# Patient Record
Sex: Male | Born: 1955
Health system: Southern US, Community
[De-identification: ages and names within clinical notes are randomized; demographics above are authoritative.]

## PROBLEM LIST (undated history)

## (undated) DIAGNOSIS — I10 Essential (primary) hypertension: Secondary | ICD-10-CM

## (undated) DIAGNOSIS — D649 Anemia, unspecified: Secondary | ICD-10-CM

## (undated) DIAGNOSIS — R569 Unspecified convulsions: Secondary | ICD-10-CM

## (undated) DIAGNOSIS — G4730 Sleep apnea, unspecified: Secondary | ICD-10-CM

## (undated) DIAGNOSIS — G629 Polyneuropathy, unspecified: Secondary | ICD-10-CM

## (undated) DIAGNOSIS — F419 Anxiety disorder, unspecified: Secondary | ICD-10-CM

## (undated) DIAGNOSIS — F1010 Alcohol abuse, uncomplicated: Secondary | ICD-10-CM

## (undated) HISTORY — PX: THROAT SURGERY: SHX803

## (undated) HISTORY — DX: Anxiety disorder, unspecified: F41.9

## (undated) HISTORY — DX: Alcohol abuse, uncomplicated: F10.10

---

## 2000-12-13 ENCOUNTER — Emergency Department (HOSPITAL_COMMUNITY): Admission: EM | Admit: 2000-12-13 | Discharge: 2000-12-13 | Payer: Self-pay | Admitting: Emergency Medicine

## 2001-06-07 ENCOUNTER — Emergency Department (HOSPITAL_COMMUNITY): Admission: EM | Admit: 2001-06-07 | Discharge: 2001-06-07 | Payer: Self-pay | Admitting: Emergency Medicine

## 2003-06-28 ENCOUNTER — Ambulatory Visit (HOSPITAL_COMMUNITY): Admission: RE | Admit: 2003-06-28 | Discharge: 2003-06-28 | Payer: Self-pay | Admitting: Internal Medicine

## 2003-09-19 ENCOUNTER — Encounter (HOSPITAL_COMMUNITY): Admission: RE | Admit: 2003-09-19 | Discharge: 2003-09-20 | Payer: Self-pay | Admitting: Internal Medicine

## 2004-10-26 ENCOUNTER — Ambulatory Visit (HOSPITAL_COMMUNITY): Admission: RE | Admit: 2004-10-26 | Discharge: 2004-10-26 | Payer: Self-pay | Admitting: Internal Medicine

## 2005-05-21 ENCOUNTER — Ambulatory Visit (HOSPITAL_COMMUNITY): Admission: RE | Admit: 2005-05-21 | Discharge: 2005-05-21 | Payer: Self-pay | Admitting: Internal Medicine

## 2006-12-26 ENCOUNTER — Ambulatory Visit (HOSPITAL_COMMUNITY): Admission: RE | Admit: 2006-12-26 | Discharge: 2006-12-26 | Payer: Self-pay | Admitting: Internal Medicine

## 2007-05-06 ENCOUNTER — Ambulatory Visit: Payer: Self-pay | Admitting: Internal Medicine

## 2007-05-17 ENCOUNTER — Emergency Department (HOSPITAL_COMMUNITY): Admission: EM | Admit: 2007-05-17 | Discharge: 2007-05-17 | Payer: Self-pay | Admitting: Emergency Medicine

## 2007-05-18 ENCOUNTER — Ambulatory Visit (HOSPITAL_COMMUNITY): Admission: RE | Admit: 2007-05-18 | Discharge: 2007-05-18 | Payer: Self-pay | Admitting: Internal Medicine

## 2007-05-18 ENCOUNTER — Encounter: Payer: Self-pay | Admitting: Internal Medicine

## 2007-05-18 ENCOUNTER — Ambulatory Visit: Payer: Self-pay | Admitting: Internal Medicine

## 2007-05-18 HISTORY — PX: COLONOSCOPY: SHX174

## 2007-08-19 ENCOUNTER — Observation Stay (HOSPITAL_COMMUNITY): Admission: AD | Admit: 2007-08-19 | Discharge: 2007-08-21 | Payer: Self-pay | Admitting: Internal Medicine

## 2007-09-13 ENCOUNTER — Emergency Department (HOSPITAL_COMMUNITY): Admission: EM | Admit: 2007-09-13 | Discharge: 2007-09-13 | Payer: Self-pay | Admitting: Emergency Medicine

## 2007-11-09 ENCOUNTER — Ambulatory Visit (HOSPITAL_COMMUNITY): Admission: RE | Admit: 2007-11-09 | Discharge: 2007-11-09 | Payer: Self-pay | Admitting: Internal Medicine

## 2010-01-08 ENCOUNTER — Observation Stay (HOSPITAL_COMMUNITY): Admission: EM | Admit: 2010-01-08 | Discharge: 2010-01-09 | Payer: Self-pay | Admitting: Emergency Medicine

## 2010-05-29 ENCOUNTER — Encounter (INDEPENDENT_AMBULATORY_CARE_PROVIDER_SITE_OTHER): Payer: Self-pay

## 2010-07-05 NOTE — Letter (Signed)
Summary: Recall, Screening Colonoscopy Only  [facility]  [address]   [place], [place] [address]   Phone: [phone]  Fax: [phone]    May 29, 2010  ALCARIO TINKEY [address] [place], [place]  [address] [birth year]   Dear Mr. Smethers,   Our records indicate it is time to schedule your colonoscopy.    Please call our office at [phone] and ask for the nurse.   Thank you,  Hendricks Limes, LPN Cloria Spring, LPN  [facility] Ph: [phone]   Fax: [phone]

## 2010-08-06 ENCOUNTER — Inpatient Hospital Stay (HOSPITAL_COMMUNITY): Payer: BC Managed Care – PPO

## 2010-08-06 ENCOUNTER — Observation Stay (HOSPITAL_COMMUNITY)
Admission: EM | Admit: 2010-08-06 | Discharge: 2010-08-09 | DRG: 430 | Disposition: A | Discharge status: Other Acute Inpatient Hospital | Payer: BC Managed Care – PPO | Source: Ambulatory Visit | Attending: Internal Medicine | Admitting: Internal Medicine

## 2010-08-06 DIAGNOSIS — K219 Gastro-esophageal reflux disease without esophagitis: Secondary | ICD-10-CM | POA: Diagnosis present

## 2010-08-06 DIAGNOSIS — F29 Unspecified psychosis not due to a substance or known physiological condition: Principal | ICD-10-CM | POA: Diagnosis present

## 2010-08-06 DIAGNOSIS — N189 Chronic kidney disease, unspecified: Secondary | ICD-10-CM | POA: Diagnosis present

## 2010-08-06 DIAGNOSIS — N400 Enlarged prostate without lower urinary tract symptoms: Secondary | ICD-10-CM | POA: Diagnosis present

## 2010-08-06 DIAGNOSIS — F1010 Alcohol abuse, uncomplicated: Secondary | ICD-10-CM | POA: Diagnosis present

## 2010-08-06 DIAGNOSIS — I129 Hypertensive chronic kidney disease with stage 1 through stage 4 chronic kidney disease, or unspecified chronic kidney disease: Secondary | ICD-10-CM | POA: Diagnosis present

## 2010-08-06 DIAGNOSIS — F3289 Other specified depressive episodes: Secondary | ICD-10-CM | POA: Diagnosis present

## 2010-08-06 DIAGNOSIS — F329 Major depressive disorder, single episode, unspecified: Secondary | ICD-10-CM | POA: Diagnosis present

## 2010-08-06 LAB — CBC
HCT: 40.8 % (ref 39.0–52.0)
Hemoglobin: 13.4 g/dL (ref 13.0–17.0)
MCH: 30.8 pg (ref 26.0–34.0)
MCV: 93.8 fL (ref 78.0–100.0)
Platelets: 229 10*3/uL (ref 150–400)
RBC: 4.35 MIL/uL (ref 4.22–5.81)
WBC: 8.3 10*3/uL (ref 4.0–10.5)

## 2010-08-06 LAB — URINALYSIS, ROUTINE W REFLEX MICROSCOPIC
Glucose, UA: NEGATIVE mg/dL
Hgb urine dipstick: NEGATIVE
Nitrite: NEGATIVE
Protein, ur: NEGATIVE mg/dL
Specific Gravity, Urine: 1.02 (ref 1.005–1.030)
Urobilinogen, UA: 1 mg/dL (ref 0.0–1.0)
pH: 5.5 (ref 5.0–8.0)

## 2010-08-06 LAB — DIFFERENTIAL
Basophils Absolute: 0 10*3/uL (ref 0.0–0.1)
Eosinophils Absolute: 0.1 10*3/uL (ref 0.0–0.7)
Eosinophils Relative: 1 % (ref 0–5)
Lymphocytes Relative: 15 % (ref 12–46)
Lymphs Abs: 1.3 10*3/uL (ref 0.7–4.0)
Monocytes Absolute: 0.7 10*3/uL (ref 0.1–1.0)
Monocytes Relative: 8 % (ref 3–12)
Neutrophils Relative %: 75 % (ref 43–77)

## 2010-08-06 LAB — COMPREHENSIVE METABOLIC PANEL
AST: 15 U/L (ref 0–37)
Albumin: 3.6 g/dL (ref 3.5–5.2)
BUN: 20 mg/dL (ref 6–23)
CO2: 27 mEq/L (ref 19–32)
Chloride: 101 mEq/L (ref 96–112)
Creatinine, Ser: 1.76 mg/dL — ABNORMAL HIGH (ref 0.4–1.5)
GFR calc non Af Amer: 41 mL/min — ABNORMAL LOW (ref >60)
Glucose, Bld: 125 mg/dL — ABNORMAL HIGH (ref 70–99)
Potassium: 4.9 mEq/L (ref 3.5–5.1)
Total Bilirubin: 0.9 mg/dL (ref 0.3–1.2)
Total Protein: 7 g/dL (ref 6.0–8.3)

## 2010-08-06 LAB — RAPID URINE DRUG SCREEN, HOSP PERFORMED
Amphetamines: NOT DETECTED
Barbiturates: NOT DETECTED
Benzodiazepines: NOT DETECTED
Cocaine: NOT DETECTED
Opiates: NOT DETECTED

## 2010-08-06 LAB — MAGNESIUM: Magnesium: 1.2 mg/dL — ABNORMAL LOW (ref 1.5–2.5)

## 2010-08-07 DIAGNOSIS — F29 Unspecified psychosis not due to a substance or known physiological condition: Secondary | ICD-10-CM

## 2010-08-08 LAB — BASIC METABOLIC PANEL
BUN: 9 mg/dL (ref 6–23)
CO2: 27 mEq/L (ref 19–32)
Chloride: 106 mEq/L (ref 96–112)
GFR calc Af Amer: >60 mL/min (ref >60)
GFR calc non Af Amer: 59 mL/min — ABNORMAL LOW (ref >60)
Glucose, Bld: 121 mg/dL — ABNORMAL HIGH (ref 70–99)
Potassium: 4.1 mEq/L (ref 3.5–5.1)
Sodium: 142 mEq/L (ref 135–145)

## 2010-08-09 ENCOUNTER — Inpatient Hospital Stay (HOSPITAL_COMMUNITY)
Admission: AD | Admit: 2010-08-09 | Discharge: 2010-08-11 | DRG: 430 | Disposition: A | Discharge status: Home / Self Care | Payer: BC Managed Care – PPO | Source: Ambulatory Visit | Attending: Psychiatry | Admitting: Psychiatry

## 2010-08-09 DIAGNOSIS — F329 Major depressive disorder, single episode, unspecified: Secondary | ICD-10-CM

## 2010-08-09 DIAGNOSIS — E7800 Pure hypercholesterolemia, unspecified: Secondary | ICD-10-CM

## 2010-08-09 DIAGNOSIS — F1010 Alcohol abuse, uncomplicated: Secondary | ICD-10-CM

## 2010-08-09 DIAGNOSIS — K219 Gastro-esophageal reflux disease without esophagitis: Secondary | ICD-10-CM

## 2010-08-09 DIAGNOSIS — N400 Enlarged prostate without lower urinary tract symptoms: Secondary | ICD-10-CM

## 2010-08-09 DIAGNOSIS — Z880 Allergy status to penicillin: Secondary | ICD-10-CM

## 2010-08-09 DIAGNOSIS — F29 Unspecified psychosis not due to a substance or known physiological condition: Principal | ICD-10-CM

## 2010-08-09 DIAGNOSIS — I10 Essential (primary) hypertension: Secondary | ICD-10-CM

## 2010-08-09 NOTE — Consult Note (Signed)
NAME:  Tony Patterson, Tony Patterson                ACCOUNT NO.:  [id number]  MEDICAL RECORD NO.:  [id number]           PATIENT TYPE:  I  LOCATION:  A317                          FACILITY:  [facility]  PHYSICIAN:  Eulogio Ditch, MD DATE OF BIRTH:  [birth year]  DATE OF CONSULTATION:  08/07/2010 DATE OF DISCHARGE:                                CONSULTATION   REASON FOR CONSULT:  Acute psychoses.  HISTORY OF PRESENT ILLNESS:  A 55 year old white male, who reported seeing ghost in the house for the last 2 days and also hearing voices coming from the house.  The patient has no such kind of experience in the past.  His CT and MRI are negative.  Chest x-ray is negative.  UDS is negative.  Rest of the labs are within normal limits except for creatinine 1.7 and glucose 125.  The patient has no past psych hospitalization or history of suicide attempt.  Recently 30 years ago, the patient told me he tried to hang himself.  Currently, he denies any suicidal ideations.  The patient is logical and goal directed during the interview, who is not delusional.  The patient is not followed by psychiatrist in the outpatient setting or getting any counseling.  The patient was on Celexa in the past, but he is not on this medication. The patient has a history of alcohol abuse, but he reported that he is sober now.  The patient denies abuse of any drugs.  The patient lives alone, is divorced, works two part-time jobs, lives in [place].  He denied any recent trauma.  He denied any recent change in the medications.  On speaking with the RN of the patient at [facility], Misty Stanley, she told me at night the patient wake up and told them that there is a [employer] man looking for him and also that he has a roommate, but there was no roommate.  PAST MEDICAL HISTORY:  History of hypertension, hyperlipidemia, gastroesophageal reflux disease.  MENTAL STATUS EXAM:  The patient is calm, cooperative during the interview.  Fair eye contact.   No psychomotor agitation retardation noted during the interview.  No abnormal movements noticed.  Speech normal in rate, rhythm, and volume.  Thought process logical and goal directed.  Mood anxious.  Affect mood congruent.  Thought content, not suicidal or homicidal, not delusional.  Thought perception audiovisual hallucinations present.  Does not seem to be internally preoccupied. Cognition alert, awake, oriented x3.  Memory immediate, recent remote fair.  Attention and concentration fair.  Abstraction ability fair. Insight and judgment fair.  DIAGNOSES:  Axis I:  Acute psychotic disorder, NOS. Axis II:  Deferred. Axis III:  See medical notes. Axis IV:  Unspecified. Axis V:  40.  RECOMMENDATIONS: 1. I increased the patient's Risperdal from 0.25-0.5 at bedtime. 2. The patient is on Wellbutrin 300 mg that can be continued. 3. The patient can be transferred to behavioral health once medically     stable. 4. The patient agrees for transfer.     Eulogio Ditch, MD     SA/MEDQ  D:  08/07/2010  T:  08/07/2010  Job:  [id number]  Electronically Signed by Eulogio Ditch  on 08/09/2010 06:23:19 AM

## 2010-08-10 DIAGNOSIS — F23 Brief psychotic disorder: Secondary | ICD-10-CM

## 2010-08-10 NOTE — Discharge Summary (Signed)
  NAME:  [name], [name]                ACCOUNT NO.:  [id number]  MEDICAL RECORD NO.:  [id number]           PATIENT TYPE:  LOCATION:                                 FACILITY:  PHYSICIAN:  Kingsley Callander. [name], MD       DATE OF BIRTH:  March 18, 1956  DATE OF ADMISSION: DATE OF DISCHARGE:  LH                              DISCHARGE SUMMARY   ADDENDUM  No beds were available at the [facility] yesterday, and [name] spent another night at [facility].  He has been stable.  He has no hallucinations whatsoever now.  His dose of risperidone was increased from 0.25 mg to 0.5 mg last night.  His magnesium level has now normalized to 1.5.  His serum creatinine has dropped to 1.2.  He has an impaired fasting glucose of 121 which has been followed as an outpatient.  His condition is now significantly improved.  He is stable for discharge to the [facility] for further evaluation and treatment.     Kingsley Callander. [name], MDROF/MEDQ  D:  08/08/2010  T:  08/08/2010  Job:  [id number]  Electronically Signed by Carylon Perches MD on 08/10/2010 07:49:18 AM

## 2010-08-10 NOTE — H&P (Signed)
NAME:  Tony Patterson, Tony Patterson                ACCOUNT NO.:  [id number]  MEDICAL RECORD NO.:  [id number]           PATIENT TYPE:  INP  LOCATION:  A317                          FACILITY:  [facility]  PHYSICIAN:  Kingsley Callander. [name], MD       DATE OF BIRTH:  1955/10/25  DATE OF ADMISSION:  08/06/2010 DATE OF DISCHARGE:  LH                             HISTORY & PHYSICAL   CHIEF COMPLAINT:  Confusion.  HISTORY OF PRESENT ILLNESS:  This patient is a 55 year old white male who presented to the office with his brother after he had been witnessed having altered mental status at home.  The patient complained of seeing ghosts.  He complained of seeing people behind his television.  He had described a group of people coming to sleep at his house the night before after attending a local meeting.  The patient has a history of alcohol abuse, but has not consumed alcohol in approximately 3 weeks. There is no known history of recreational substance abuse.  The patient has no prior history of psychotic behavior with hallucinations or delusional thinking.  He describes having heard voices.  He had been treated for depression with Wellbutrin XL.  The patient denied any recent head trauma.  He has not experienced fever or any symptoms of infection.  PAST MEDICAL HISTORY: 1. Alcohol abuse. 2. Hypertension. 3. Hypertriglyceridemia. 4. Proteinuria. 5. Tubulovillous adenoma. 6. Chronic kidney disease. 7. BPH. 8. GERD.  MEDICATIONS: 1. Metoprolol 50 mg b.i.d. 2. Aspirin 81 mg daily. 3. Prilosec 20 mg daily. 4. Flomax 0.4 mg daily. 5. Wellbutrin XL 300 mg daily.  ALLERGIES:  PENICILLIN.  SOCIAL HISTORY:  He does not smoke cigarettes.  He has not used recreational substances.  He has not used alcohol for 3 weeks.  He has previously been to rehab at [facility].  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.  FAMILY HISTORY:  His father had coronary heart disease and congestive heart  failure.  His mother has had temporal arteritis and osteoporosis.  REVIEW OF SYSTEMS:  No fever, chills, headache, cough, chest pain, abdominal pain, difficulty voiding, diarrhea, vomiting, or loss of consciousness.  PHYSICAL EXAMINATION:  VITAL SIGNS:  Weight 193 which is unchanged from 1 month ago, blood pressure in the office 86/70, pulse 72, respirations 16, afebrile. GENERAL:  Alert and in no distress. HEENT:  Eyes, nose, pharynx, and TMs appear normal. NECK:  Supple with no JVD, thyromegaly, or carotid bruits. LUNGS: Clear. HEART:  Regular with no murmurs. ABDOMEN:  Soft and nontender with no hepatosplenomegaly. EXTREMITIES:  No cyanosis, clubbing, or edema. NEUROLOGIC:  He does not appear to be in withdrawal.  He is not tremulous.  He describes recent delusions and hallucinations.  He has no focal weakness. LYMPH NODES:  Revealed no enlargement. SKIN:  Warm and dry.  LABORATORY DATA:  Reveals a normal CBC.  His metabolic profile is normal except for a creatinine of 1.76.  His magnesium level was low at 1.2. His urine drug screen is negative.  Chest x-ray reveals old rib fractures, but no acute infiltrate.  IMPRESSION/PLAN:  1. Altered mental status.  He is now revealing signs of psychosis.  He     has a history of alcohol abuse and alcohol withdrawal.  He will be     treated empirically with thiamine, multivitamins, magnesium, and     p.r.n. Ativan.  He will be started on low-dose Risperdal at night.     The MRI of his brain is pending.  A consultation will be obtained     with Psychiatry and with the ACT team. 2. Chronic kidney disease, stable. 3. History of hypertension.  His initial blood pressure was low.     Metoprolol will be held for now. 4. Hypomagnesemia.  We will replace intravenously. 5. History of depression. 6. Gastroesophageal reflux disease, continue Prilosec. 7. History of benign prostatic hypertrophy, continue Flomax.     Kingsley Callander. [name],  MD     ROF/MEDQ  D:  08/07/2010  T:  08/07/2010  Job:  [id number]  Electronically Signed by Carylon Perches MD on 08/10/2010 07:49:16 AM

## 2010-08-12 NOTE — Discharge Summary (Signed)
  NAME:  Tony Patterson, Tony Patterson NO.:  [id number]  MEDICAL RECORD NO.:  [id number]          PATIENT TYPE:  LOCATION:                                 FACILITY:  PHYSICIAN:  Kingsley Callander. [name], MD            DATE OF BIRTH:  DATE OF ADMISSION: DATE OF DISCHARGE:  LH                              DISCHARGE SUMMARY   ADDENDUM  [name] required another night stay at [facility] after a bed could not be arranged at the [facility].  He has had no visual or auditory hallucinations overnight.  He feels well this morning and has no complaints other than some residual cough from a recent respiratory infection.  He has brought to light the question as to whether his recent confusional state has been related to overuse of cold and cough medications.  He had evidently been using these medications to excess by his report now.  He has received Risperdal now for 3 nights. His symptoms have resolved.  He will be further assessed by Psychiatry today.     Kingsley Callander. [name], MD     ROF/MEDQ  D:  08/09/2010  T:  08/09/2010  Job:  [id number]  Electronically Signed by Carylon Perches MD on 08/12/2010 09:32:04 AM

## 2010-08-17 LAB — RAPID URINE DRUG SCREEN, HOSP PERFORMED
Amphetamines: NOT DETECTED
Barbiturates: NOT DETECTED
Benzodiazepines: NOT DETECTED
Cocaine: NOT DETECTED
Opiates: NOT DETECTED
Tetrahydrocannabinol: NOT DETECTED

## 2010-08-17 LAB — ETHANOL: Alcohol, Ethyl (B): 297 mg/dL — ABNORMAL HIGH (ref 0–10)

## 2010-08-17 LAB — CBC
HCT: 40 % (ref 39.0–52.0)
Hemoglobin: 13.7 g/dL (ref 13.0–17.0)
MCH: 31.8 pg (ref 26.0–34.0)
MCHC: 34.2 g/dL (ref 30.0–36.0)
MCV: 93.1 fL (ref 78.0–100.0)
Platelets: 241 10*3/uL (ref 150–400)
RBC: 4.3 MIL/uL (ref 4.22–5.81)
RDW: 14.5 % (ref 11.5–15.5)
WBC: 8.6 10*3/uL (ref 4.0–10.5)

## 2010-08-17 LAB — COMPREHENSIVE METABOLIC PANEL
AST: 35 U/L (ref 0–37)
BUN: 21 mg/dL (ref 6–23)
CO2: 22 mEq/L (ref 19–32)
Calcium: 8.2 mg/dL — ABNORMAL LOW (ref 8.4–10.5)
Chloride: 102 mEq/L (ref 96–112)
Creatinine, Ser: 1.77 mg/dL — ABNORMAL HIGH (ref 0.4–1.5)
GFR calc non Af Amer: 40 mL/min — ABNORMAL LOW (ref >60)
Glucose, Bld: 198 mg/dL — ABNORMAL HIGH (ref 70–99)
Total Bilirubin: 0.4 mg/dL (ref 0.3–1.2)

## 2010-08-17 LAB — DIFFERENTIAL
Basophils Absolute: 0 10*3/uL (ref 0.0–0.1)
Eosinophils Relative: 0 % (ref 0–5)
Lymphocytes Relative: 23 % (ref 12–46)
Lymphs Abs: 2 10*3/uL (ref 0.7–4.0)
Neutrophils Relative %: 72 % (ref 43–77)

## 2010-09-02 ENCOUNTER — Emergency Department (HOSPITAL_COMMUNITY)
Admission: EM | Admit: 2010-09-02 | Discharge: 2010-09-02 | Disposition: A | Discharge status: Home / Self Care | Payer: BC Managed Care – PPO | Attending: Emergency Medicine | Admitting: Emergency Medicine

## 2010-09-02 DIAGNOSIS — N400 Enlarged prostate without lower urinary tract symptoms: Secondary | ICD-10-CM | POA: Insufficient documentation

## 2010-09-02 DIAGNOSIS — E7800 Pure hypercholesterolemia, unspecified: Secondary | ICD-10-CM | POA: Insufficient documentation

## 2010-09-02 DIAGNOSIS — K219 Gastro-esophageal reflux disease without esophagitis: Secondary | ICD-10-CM | POA: Insufficient documentation

## 2010-09-02 DIAGNOSIS — I10 Essential (primary) hypertension: Secondary | ICD-10-CM | POA: Insufficient documentation

## 2010-09-02 DIAGNOSIS — F1010 Alcohol abuse, uncomplicated: Secondary | ICD-10-CM | POA: Insufficient documentation

## 2010-09-02 LAB — BASIC METABOLIC PANEL
Chloride: 99 mEq/L (ref 96–112)
GFR calc non Af Amer: 51 mL/min — ABNORMAL LOW (ref >60)
Potassium: 4.7 mEq/L (ref 3.5–5.1)
Sodium: 143 mEq/L (ref 135–145)

## 2010-09-02 LAB — RAPID URINE DRUG SCREEN, HOSP PERFORMED
Amphetamines: NOT DETECTED
Barbiturates: NOT DETECTED
Benzodiazepines: NOT DETECTED
Cocaine: NOT DETECTED

## 2010-09-02 LAB — DIFFERENTIAL
Basophils Relative: 1 % (ref 0–1)
Eosinophils Absolute: 0.1 10*3/uL (ref 0.0–0.7)
Lymphs Abs: 2.1 10*3/uL (ref 0.7–4.0)
Monocytes Relative: 5 % (ref 3–12)
Neutro Abs: 3.4 10*3/uL (ref 1.7–7.7)
Neutrophils Relative %: 58 % (ref 43–77)

## 2010-09-02 LAB — CBC
Hemoglobin: 13.7 g/dL (ref 13.0–17.0)
MCV: 93 fL (ref 78.0–100.0)
Platelets: 209 10*3/uL (ref 150–400)
RBC: 4.44 MIL/uL (ref 4.22–5.81)
WBC: 5.9 10*3/uL (ref 4.0–10.5)

## 2010-09-02 LAB — ETHANOL: Alcohol, Ethyl (B): 342 mg/dL — ABNORMAL HIGH (ref 0–10)

## 2010-09-04 NOTE — Discharge Summary (Addendum)
NAME:  Tony Patterson, Tony Patterson                ACCOUNT NO.:  [id number]  MEDICAL RECORD NO.:  [id number]           PATIENT TYPE:  I  LOCATION:                                FACILITY:  [facility]  PHYSICIAN:  Eulogio Ditch, MD DATE OF BIRTH:  [birth year]  DATE OF ADMISSION:  08/08/2010 DATE OF DISCHARGE:  08/11/2010                              DISCHARGE SUMMARY   IDENTIFYING INFORMATION:  A 55 year old Caucasian male.  This is a voluntary admission.  HISTORY OF PRESENT ILLNESS:  [facility] for Previn who presented on transfer from [facility] where he had been admitted on March 5 for altered mental status.  He was seeing people talking behind the television and seeing ghostly type figures.  He reported that 1 month previously he had had a brief relapse on alcohol. He had previously been on our unit in August 2011 also for our alcohol abuse and had a period of sobriety until his recent relapse.  At the time he presented on our unit, he had no further hallucinations for the previous 48 hours.  MEDICAL EVALUATION AND DIAGNOSTIC STUDIES:  Braxley is followed by Dr. Carylon Perches, his primary care physician, who treated him at [facility]. He does have a history of chronic kidney disease and alcoholic hepatitis and GERD.  He was being observed for a period of delirium and is in full contact at the time of transfer.  COURSE OF HOSPITALIZATION:  He was admitted to our acute stabilization unit.  He initially presented in full contact with reality, fully oriented.  No response latency, affect appropriate.  No evidence of delirium, confusion or psychosis.  We elected to start him back on his Wellbutrin XR 300 mg which he had previously taken, and Risperdal 0.5 mg p.o. q.h.s. which had been started on the medical unit.  We also resumed his other routine medications and gave him thiamine 100 mg daily, aspirin 81 mg daily.  He had talked with Dr. [name] and revealed that he had also been  taking some Benadryl prior to admission and we were operating under the possibility that his altered mental status could have been anticholinergic induced combined with some alcohol abuse.  By March 10, he continued to be in full contact with reality, interacting appropriately with peers and staff, attentive in group and had no subjective complaints.  No dangerous ideas, thinking goal directed and logical.  Our counselor had made contact with his brother Annette Stable who continued to express his support.  DISCHARGE DIAGNOSIS:  AXIS I:  Altered mental status secondary to medication-induced side effects.  Alcohol abuse. AXIS II:  No diagnosis. AXIS III:  History of chronic kidney disease. AXIS IV:  No diagnosis. AXIS V: Current 62, past year 66 estimated.  DISCHARGE MEDICATIONS: 1. Risperdal 0.5 mg p.o. q.h.s. and review with Dr. [name] at next     appointment. 2. Aspirin 81 mg daily. 3. Dextromethorphan 1-2 teaspoons q.6 h as needed for cough. 4. Flomax 0.4 mg daily. 5. Metoprolol 50 mg b.i.d. 6. Prilosec 1 tablet daily 20 mg. 7. Wellbutrin XL 300 mg daily.  Margaret A. Lorin Picket, N.P.   ______________________________ Eulogio Ditch, MD    MAS/MEDQ  D:  08/29/2010  T:  08/29/2010  Job:  [id number]  Electronically Signed by Kari Baars N.P. on 09/04/2010 05:10:24 PM Electronically Signed by Eulogio Ditch  on 09/04/2010 07:08:33 PM

## 2010-09-12 ENCOUNTER — Emergency Department (HOSPITAL_COMMUNITY)
Admission: EM | Admit: 2010-09-12 | Discharge: 2010-09-13 | Disposition: A | Discharge status: Home / Self Care | Payer: BC Managed Care – PPO | Attending: Emergency Medicine | Admitting: Emergency Medicine

## 2010-09-12 DIAGNOSIS — F329 Major depressive disorder, single episode, unspecified: Secondary | ICD-10-CM | POA: Insufficient documentation

## 2010-09-12 DIAGNOSIS — F1010 Alcohol abuse, uncomplicated: Secondary | ICD-10-CM | POA: Insufficient documentation

## 2010-09-12 DIAGNOSIS — F3289 Other specified depressive episodes: Secondary | ICD-10-CM | POA: Insufficient documentation

## 2010-09-12 DIAGNOSIS — I10 Essential (primary) hypertension: Secondary | ICD-10-CM | POA: Insufficient documentation

## 2010-09-12 LAB — CBC
HCT: 41.6 % (ref 39.0–52.0)
Hemoglobin: 14 g/dL (ref 13.0–17.0)
RBC: 4.52 MIL/uL (ref 4.22–5.81)
RDW: 14.5 % (ref 11.5–15.5)
WBC: 6.4 10*3/uL (ref 4.0–10.5)

## 2010-09-12 LAB — DIFFERENTIAL
Basophils Absolute: 0.1 10*3/uL (ref 0.0–0.1)
Lymphocytes Relative: 43 % (ref 12–46)
Neutro Abs: 3 10*3/uL (ref 1.7–7.7)
Neutrophils Relative %: 47 % (ref 43–77)

## 2010-09-12 LAB — COMPREHENSIVE METABOLIC PANEL
ALT: 24 U/L (ref 0–53)
AST: 26 U/L (ref 0–37)
CO2: 28 mEq/L (ref 19–32)
Calcium: 8.7 mg/dL (ref 8.4–10.5)
GFR calc Af Amer: 48 mL/min — ABNORMAL LOW (ref >60)
GFR calc non Af Amer: 39 mL/min — ABNORMAL LOW (ref >60)
Potassium: 3.8 mEq/L (ref 3.5–5.1)
Sodium: 140 mEq/L (ref 135–145)

## 2010-09-12 LAB — RAPID URINE DRUG SCREEN, HOSP PERFORMED
Amphetamines: NOT DETECTED
Tetrahydrocannabinol: NOT DETECTED

## 2010-10-16 NOTE — Discharge Summary (Signed)
NAME:  Tony Patterson, Tony Patterson                ACCOUNT NO.:  [id number]   MEDICAL RECORD NO.:  [id number]          PATIENT TYPE:  OBV   LOCATION:  A336                          FACILITY:  [facility]   PHYSICIAN:  Kingsley Callander. [name], MD       DATE OF BIRTH:  10-Mar-1956   DATE OF ADMISSION:  08/19/2007  DATE OF DISCHARGE:  03/20/2009LH                               DISCHARGE SUMMARY   DISCHARGE DIAGNOSES:  1. Hypotension.  2. Alcohol withdrawal  3. Alcoholic hepatitis.  4. Hypomagnesemia.  5. Hypertension.  6. Benign prostatic hypertrophy.  7. Gastroesophageal reflux disease.  8. Hyperlipidemia.  9. Chronic kidney disease.  10.Impaired fasting glucose.  11.History of colon polyps.   HOSPITAL COURSE:  This patient is a 55 year old male who presented with  tremulousness, weakness, and hypotension.  His blood pressure was in the  mid 80s systolic.  He had recently been drinking alcohol heavily.  He  still an alcohol level of 220.  Liver enzymes were elevated.  His SGOT  was 143 with an SGPT of 90.  His albumin was mildly low at 3.4.  Hemoglobin was 12.5.  He was hospitalized and started on IV normal  saline.  Micardis which she had taken for hypertension was held.  His  blood pressure normalized.  He was treated with an alcohol detox regimen  of thiamine, multivitamins, magnesium, folic acid, and Ativan.  He was  hypomagnesemic at 1.2.   He underwent ultrasound of the liver which was negative.  A repeat liver  profile revealed slight reductions in his transaminases with a drop in  his SGOT to 131 and SGPT of 83.  His albumin dropped to 3.1.  He was  able to eat well.   Lipitor, aspirin, and Micardis were held   He was seen by the ACT team.  Arrangements have been made for treatment  at [facility], but unfortunately a bed is  not available until next  week.   He remained alert and oriented.  He had  tremulousness which  responded  to lorazepam.  He has been counseled to avoid all alcohol.   DISCHARGE MEDICATIONS:  1. Lorazepam 1 mg q.i.d.  2. Thiamine 100 mg daily.  3. Magnesium oxide 400 mg b.i.d.  4. Multivitamin daily.  5. Lexapro 10 mg daily.  6. Prilosec 20 mg daily.  7. Flomax 0.4 mg daily.   FOLLOW UP:  The patient will be seen in our office in one week.  He will  be treated at [facility] when a bed becomes available next week.      Kingsley Callander. [name], MD  Electronically Signed     ROF/MEDQ  D:  08/21/2007  T:  08/21/2007  Job:  [id number]

## 2010-10-16 NOTE — H&P (Signed)
NAME:  Tony Patterson, Tony Patterson                ACCOUNT NO.:  [id number]   MEDICAL RECORD NO.:  [id number]          PATIENT TYPE:  OBV   LOCATION:  A336                          FACILITY:  [facility]   PHYSICIAN:  Kingsley Callander. [name], MD       DATE OF BIRTH:  Oct 02, 1955   DATE OF ADMISSION:  08/19/2007  DATE OF DISCHARGE:  LH                              HISTORY & PHYSICAL   HISTORY OF PRESENT ILLNESS:  The patient is a 55 year old white male who  presented with weakness and tremor.  He had been consuming alcohol  heavily.  He has had several falls.  He has been eating poorly.  He has  felt shaky and has medicated the shakes with alcohol.  His brother is  with him.  He has been convinced to seek proper treatment for this now.  He has previously been advised on multiple occasions to discontinue  alcohol use.  He has had liver enzyme abnormalities previously.   PAST MEDICAL HISTORY:  1. Hypertension.  2. Chronic kidney disease.  3. Benign prostatic hypertrophy.  4. Hyperlipidemia.  5. Impaired fasting glucose.  6. Colon polyps.   MEDICATIONS:  1. Micardis 40 mg daily.  2. Flomax 0.4 mg daily.  3. Lipitor 10 mg daily.  4. Lexapro 10 mg daily.  5. Prilosec 20 mg daily   ALLERGIES:  PENICILLIN.   SOCIAL HISTORY:  He does not use recreational drugs.  He does not smoke.  He is retired from [employer].  He lives alone.   FAMILY HISTORY:  His father had coronary artery disease.  His mother has  had temporal arteritis and osteoporosis and hypertension.   REVIEW OF SYSTEMS:  He denied syncope.  He has felt nauseated, but has  not vomited.  His oral intake has been sporadic and has recently lost  about 10 pounds.   PHYSICAL EXAM:  VITAL SIGNS:  Initial blood pressure 84/68, pulse 100,  respirations 16.  GENERAL:  Mildly tremulous, but alert and oriented.  HEENT:  He has abrasion on his forehead.  No scleral icterus.  Pharynx  is moist.  NECK:  Supple without JVD, thyromegaly or lymphadenopathy.  LUNGS:  Clear.  HEART:  Regular with no murmurs.  ABDOMEN:  Nontender.  No hepatosplenomegaly.  EXTREMITIES:  No cyanosis, clubbing or edema.  He has a bruise on his  right hip.  NEUROLOGIC:  Pupils equal, round and reactive to light.  Extraocular  movements intact.  Gait slightly unsteady.  Strength intact.  LYMPH NODES:  No enlargement   LABORATORY DATA:  White count 4000, hemoglobin 12.6 with an MCV of  95,000, platelets 162,000.  Sodium 133, potassium 4.8, chloride 21,  glucose 77, BUN 14, creatinine 1.53, bilirubin 1, SGOT 143, SGPT 90,  total protein 6.3, albumin 3.4, calcium 8.9.  Alcohol level 220. INR  0.9.  Ammonia level is normal.   EKG reveals normal sinus rhythm with no ischemic changes.   IMPRESSION:  1. Alcohol withdrawal.  He still has a significant alcohol level and      states he last had a couple  of glasses of wine the night before,      but had consumed bourbon prior to that yesterday.  He will be      hospitalized and treated with a detoxification regimen of thiamine,      multivitamins, folic acid and Ativan.  2. Alcoholic hepatitis.  We will obtain an ultrasound of the liver.      Lipitor will be stopped.  3. Hypotension.  We will treat with intravenous fluids and hold      Micardis.  4. Benign prostatic hypertrophy.  Continue Flomax.  5. History of gastroesophageal reflux disease.  Continue Prilosec.  6. Depression.  Continue Lexapro.  We will obtain an ACT team consult.      A transfer to [facility] for a 28-day rehabilitation program      would certainly be beneficial.      Kingsley Callander. [name], MD  Electronically Signed     ROF/MEDQ  D:  08/20/2007  T:  08/20/2007  Job:  [id number]

## 2010-10-16 NOTE — Op Note (Signed)
NAME:  Tony Patterson, Tony Patterson                ACCOUNT NO.:  [id number]   MEDICAL RECORD NO.:  [id number]          PATIENT TYPE:  AMB   LOCATION:  DAY                           FACILITY:  [facility]   PHYSICIAN:  R. Roetta Sessions, M.D. DATE OF BIRTH:  07/07/1955   DATE OF PROCEDURE:  05/18/2007  DATE OF DISCHARGE:                               OPERATIVE REPORT   PROCEDURE:  Colonoscopy, polypectomy.   ENDOSCOPIST:  Dr. Jonathon Bellows.   INDICATIONS FOR PROCEDURE:  A 55 year old gentleman followed primarily  by Dr. Kingsley Callander. [name], noted a couple of episodes of low volume painless  hematochezia one month ago.  He has one bowel movement daily.  Otherwise  he denies constipation and diarrhea.  No family history of colorectal  neoplasias .  Has never had his lower GI tract evaluated.  A colonoscopy  is now being done.  This approach was discussed with the patient at  length.  The potential risks, benefits and alternatives have been  discussed and questions answered.  Will place the document in the  patient's medical record.   DESCRIPTION OF PROCEDURE:  Oxygen saturation, blood pressure, pulse and  respirations were monitored throughout the entire procedure.  Conscious  sedation with Versed 5 mg IV and Demerol 125 mg IV in divided doses.  The instrument is the Pentax video chip system.  A digital rectal  examination revealed no abnormalities.  The prep was adequate.   COLON:  The colonic mucosa was observed from the rectosigmoid junction  to the left, transverse and right colon,  to the area of the appendiceal  orifice, the ileocecal valve and the cecum.  These structures were well  seen and photographed for the record.  There was an 8 mm sessile polyp  at the base of the cecum, which was treated with one pass of the hot  snare cautery and recovered.  From this level, the colonoscope was  slowly withdrawn.  All previously-mentioned mucosal surfaces were again  seen.  The colonic mucosa otherwise  appeared normal.  The scope was  pulled down in the rectum.  A thorough examination of the rectal mucosa,  including the retroflexed view of the anal verge demonstrated a single  anal papilla hemorrhoid only.   The patient tolerated the procedure well and was reactive in endoscopy.   IMPRESSION:  1. Internal hemorrhoids, a single anal papilla, otherwise normal.  2. Single polyp, as described above, removed as described above.      Otherwise normal colon.   RECOMMENDATIONS:  1. Hemorrhoid literature provided to [name].  2. A 10-day course of Anusol AC suppositories, one per rectum at      bedtime.  3. Follow up on pathology.  4. Further recommendations to follow.      Jonathon Bellows, M.D.  Electronically Signed     RMR/MEDQ  D:  05/18/2007  T:  05/18/2007  Job:  [id number]

## 2010-10-16 NOTE — H&P (Signed)
NAME:  Tony Patterson, Tony Patterson                ACCOUNT NO.:  [id number]   MEDICAL RECORD NO.:  [id number]          PATIENT TYPE:  AMB   LOCATION:  DAY                           FACILITY:  [facility]   PHYSICIAN:  R. Roetta Sessions, M.D. DATE OF BIRTH:  [birth year]   DATE OF ADMISSION:  05/06/2007  DATE OF DISCHARGE:  LH                              HISTORY & PHYSICAL   CHIEF COMPLAINT:  Hematochezia.   HISTORY OF PRESENT ILLNESS:  Mr. Tony Darling. Patterson is a pleasant 55-year-  old Caucasian male followed primarily by Dr. Carylon Patterson, who came to see  me on his own to further evaluate a couple of episodes of hematochezia  he had 1 month ago.  He states in the setting of a normal bowel movement  without straining, constipation or diarrhea, he passed some blood per  rectum which he saw on stool and toilet paper when he wiped.  He has not  had any associated abdominal pain.  There has been no melena.  He has  not seen any rectal bleeding prior to this episode or since that time.  He has never had his lower GI tract evaluated.  His only GI problems in  the past are that of occasional gastroesophageal reflux disease symptoms  for which he takes Prilosec OTC with good control of his symptoms.  There is no family history of inflammatory bowel disease of colorectal  neoplasia.  Again, he has never had his lower GI tract evaluated.   Tony Patterson denies any odynophagia, dysphagia, early satiety, nausea or  vomiting.  He does site a 50-pound weight loss over the past several  months in the wake of separation and impending divorce.  He tells me he  cooks for himself and does not like what he prepares.  His oral intake  has diminished significantly since his separation.   PAST MEDICAL HISTORY:  1. Significant for hypercholesterolemia.  2. Hypertension.  3. History of panic attacks.   PAST SURGERIES:  1. Some type of throat surgery 20 years ago.  2. He had surgery of his left foot secondary to fracture in the past.   CURRENT MEDICATIONS:  1. Cozaar 100 mg daily.  2. Prilosec OTC daily.  3. Lipitor 10 mg daily.  4. ASA 81 mg daily.  5. Possibly benzodiazepine for panic attacks.   ALLERGIES:  PENICILLIN.   FAMILY HISTORY:  Mother is alive at age 3 and in fairly good health.  Father died at age 38 with heart failure.  No history of chronic GI or  liver illness.   SOCIAL HISTORY:  Patient is separated.  No children.  He works part-time  at [employer].  He does not use tobacco.  He drinks approximately  4 ounces of vodka 3-4 times weekly.  Occasionally he has a beer.  No  illicit drugs.   REVIEW OF SYSTEMS:  No chest pain, dyspnea on exertion, fever, chills.  Weight loss as outlined above.  Otherwise as in history of present  illness.   PHYSICAL EXAMINATION:  GENERAL:  A 55 year old gentleman resting  comfortably.  VITAL SIGNS:  Weight 191, height 6 feet 4 inches, temperature 98.2, BP  112/78, pulse 68.  SKIN:  Warm and dry.  There is no jaundice or any stigmata of chronic  liver disease.  HEENT:  There is no scleral icterus.  CHEST:  Lungs are clear to auscultation.  HEART:  Regular rate and rhythm without murmur, gallop or rub.  ABDOMEN:  Flat, positive bowel sounds, soft, nontender without  appreciable mass or organomegaly.  EXTREMITIES:  No edema.  RECTAL:  Exam deferred for colonoscopy.   IMPRESSION:  Tony Patterson is a very pleasant 55 year old  gentleman with a recent episode of hematochezia.  He is devoid of any  lower GI tract symptoms currently.  I told Tony Patterson to just go ahead  and have his lower GI tract evaluated as he is at the threshold age for 55  screening colonoscopy anyway.   He has sustained a significant weight loss recently which seemed to be  well explained by his domestic stressors and more change in eating  habits.   RECOMMENDATIONS:  Diagnostic colonoscopy in the very near future.  Risks, benefits and alternatives have been reviewed.  Tony Patterson   questions were answered and he is agreeable.  We will proceed in the  very near future.  Further recommendations to follow.      Tony Patterson, M.D.  Electronically Signed     RMR/MEDQ  D:  05/06/2007  T:  05/06/2007  Job:  [id number]

## 2010-10-19 NOTE — Group Therapy Note (Signed)
NAME:  Tony Patterson, Tony Patterson                          ACCOUNT NO.:  [id number]   MEDICAL RECORD NO.:  [id number]                  PATIENT TYPE:  PREC   LOCATION:                                       FACILITY:   PHYSICIAN:  Kingsley Callander. [name], M.D.                  DATE OF BIRTH:   DATE OF PROCEDURE:  DATE OF DISCHARGE:                                    STRESS TEST   FINDINGS:  1. The patient exercised 9 minutes (completing stage 3 of the Bruce     protocol), obtaining a maximal heart rate of 174 (100% of the age-     predicted maximal heart rate), at a work load of 10.1 mets.  Discontinued     exercise after surpassing his target heart rate.  2. He developed mild substernal chest pressure at peak exercise.  There were     frequent PVC's in early recovery which resolved spontaneously.  There     were no other arrhythmias.  3. There was a hypertensive blood pressure response to exercise, reaching a     blood pressure of 208/92.  4.  There were no ST segment changes     diagnostic of ischemia.   IMPRESSION:  1. No evidence of exercise-induced ischemia.  2. Cardiolite images are pending.      ___________________________________________                                            Kingsley Callander. [name], M.D.   ROF/MEDQ  D:  09/19/2003  T:  09/19/2003  Job:  [id number]

## 2011-02-25 LAB — COMPREHENSIVE METABOLIC PANEL
BUN: 14
Calcium: 8.9
Glucose, Bld: 77
Sodium: 133 — ABNORMAL LOW
Total Protein: 6.3

## 2011-02-25 LAB — DIFFERENTIAL
Lymphocytes Relative: 22
Lymphs Abs: 0.9
Monocytes Relative: 8
Neutro Abs: 2.8
Neutrophils Relative %: 69

## 2011-02-25 LAB — MAGNESIUM: Magnesium: 1.2 — ABNORMAL LOW

## 2011-02-25 LAB — CBC
HCT: 35.9 — ABNORMAL LOW
Hemoglobin: 12.6 — ABNORMAL LOW
MCHC: 35.1
RDW: 14.6

## 2011-02-25 LAB — APTT: aPTT: 26

## 2011-02-25 LAB — HEPATIC FUNCTION PANEL
Indirect Bilirubin: 0.7
Total Protein: 6.3

## 2011-02-25 LAB — PROTIME-INR: INR: 0.9

## 2011-02-26 LAB — COMPREHENSIVE METABOLIC PANEL
ALT: 114 — ABNORMAL HIGH
AST: 155 — ABNORMAL HIGH
Albumin: 3.1 — ABNORMAL LOW
Alkaline Phosphatase: 99
BUN: 9
CO2: 26
Calcium: 9.3
Chloride: 109
Creatinine, Ser: 1.5
GFR calc Af Amer: 60 — ABNORMAL LOW
GFR calc non Af Amer: 50 — ABNORMAL LOW
Glucose, Bld: 119 — ABNORMAL HIGH
Potassium: 4.5
Sodium: 141
Total Bilirubin: 0.8
Total Protein: 6.2

## 2011-02-26 LAB — OCCULT BLOOD X 1 CARD TO LAB, STOOL: Fecal Occult Bld: NEGATIVE

## 2011-02-26 LAB — DIFFERENTIAL
Basophils Absolute: 0
Basophils Relative: 0
Eosinophils Absolute: 0.2
Eosinophils Relative: 4
Lymphocytes Relative: 18
Lymphs Abs: 1
Monocytes Absolute: 0.5
Monocytes Relative: 9
Neutro Abs: 3.7
Neutrophils Relative %: 69

## 2011-02-26 LAB — CBC
HCT: 35.5 — ABNORMAL LOW
Hemoglobin: 12.2 — ABNORMAL LOW
MCHC: 34.4
MCV: 96.3
Platelets: 181
RBC: 3.68 — ABNORMAL LOW
RDW: 15.3
WBC: 5.3

## 2011-10-08 ENCOUNTER — Observation Stay (HOSPITAL_COMMUNITY)
Admission: EM | Admit: 2011-10-08 | Discharge: 2011-10-09 | Disposition: A | Discharge status: Home / Self Care | Payer: BC Managed Care – PPO | Attending: Internal Medicine | Admitting: Internal Medicine

## 2011-10-08 ENCOUNTER — Emergency Department (HOSPITAL_COMMUNITY): Payer: BC Managed Care – PPO

## 2011-10-08 ENCOUNTER — Encounter (HOSPITAL_COMMUNITY): Payer: Self-pay

## 2011-10-08 DIAGNOSIS — E860 Dehydration: Secondary | ICD-10-CM | POA: Insufficient documentation

## 2011-10-08 DIAGNOSIS — N400 Enlarged prostate without lower urinary tract symptoms: Secondary | ICD-10-CM | POA: Insufficient documentation

## 2011-10-08 DIAGNOSIS — F1010 Alcohol abuse, uncomplicated: Secondary | ICD-10-CM | POA: Insufficient documentation

## 2011-10-08 DIAGNOSIS — N183 Chronic kidney disease, stage 3 unspecified: Secondary | ICD-10-CM | POA: Insufficient documentation

## 2011-10-08 DIAGNOSIS — E876 Hypokalemia: Secondary | ICD-10-CM | POA: Insufficient documentation

## 2011-10-08 DIAGNOSIS — R569 Unspecified convulsions: Principal | ICD-10-CM | POA: Insufficient documentation

## 2011-10-08 DIAGNOSIS — I10 Essential (primary) hypertension: Secondary | ICD-10-CM | POA: Insufficient documentation

## 2011-10-08 HISTORY — DX: Unspecified convulsions: R56.9

## 2011-10-08 HISTORY — DX: Essential (primary) hypertension: I10

## 2011-10-08 LAB — DIFFERENTIAL
Basophils Absolute: 0 10*3/uL (ref 0.0–0.1)
Eosinophils Relative: 2 % (ref 0–5)
Lymphocytes Relative: 24 % (ref 12–46)
Monocytes Absolute: 0.3 10*3/uL (ref 0.1–1.0)
Monocytes Relative: 7 % (ref 3–12)

## 2011-10-08 LAB — BASIC METABOLIC PANEL
BUN: 13 mg/dL (ref 6–23)
CO2: 26 mEq/L (ref 19–32)
Calcium: 8.9 mg/dL (ref 8.4–10.5)
Creatinine, Ser: 1.38 mg/dL — ABNORMAL HIGH (ref 0.50–1.35)

## 2011-10-08 LAB — CBC
MCH: 29.8 pg (ref 26.0–34.0)
MCHC: 32.6 g/dL (ref 30.0–36.0)
RBC: 4.06 MIL/uL — ABNORMAL LOW (ref 4.22–5.81)
RDW: 16.4 % — ABNORMAL HIGH (ref 11.5–15.5)

## 2011-10-08 LAB — GLUCOSE, CAPILLARY: Glucose-Capillary: 131 mg/dL — ABNORMAL HIGH (ref 70–99)

## 2011-10-08 MED ORDER — FOLIC ACID 1 MG PO TABS
1.0000 mg | ORAL_TABLET | Freq: Every day | ORAL | Status: DC
  Administered 2011-10-08 – 2011-10-09 (×2): 1 mg via ORAL
  Filled 2011-10-08 (×2): qty 1

## 2011-10-08 MED ORDER — LORAZEPAM 2 MG/ML IJ SOLN
1.0000 mg | INTRAMUSCULAR | Status: DC | PRN
  Administered 2011-10-08: 1 mg via INTRAVENOUS
  Filled 2011-10-08: qty 1

## 2011-10-08 MED ORDER — SODIUM CHLORIDE 0.9 % IJ SOLN
3.0000 mL | Freq: Two times a day (BID) | INTRAMUSCULAR | Status: DC
  Administered 2011-10-09: 3 mL via INTRAVENOUS
  Filled 2011-10-08: qty 3

## 2011-10-08 MED ORDER — VITAMIN B-1 100 MG PO TABS
100.0000 mg | ORAL_TABLET | Freq: Every day | ORAL | Status: DC
  Administered 2011-10-08 – 2011-10-09 (×2): 100 mg via ORAL
  Filled 2011-10-08 (×2): qty 1

## 2011-10-08 MED ORDER — ONDANSETRON HCL 4 MG/2ML IJ SOLN: 4.0000 mg | Freq: Four times a day (QID) | INTRAMUSCULAR | Status: DC | PRN

## 2011-10-08 MED ORDER — ALUM & MAG HYDROXIDE-SIMETH 200-200-20 MG/5ML PO SUSP: 30.0000 mL | Freq: Four times a day (QID) | ORAL | Status: DC | PRN

## 2011-10-08 MED ORDER — ACETAMINOPHEN 650 MG RE SUPP: 650.0000 mg | Freq: Four times a day (QID) | RECTAL | Status: DC | PRN

## 2011-10-08 MED ORDER — TAMSULOSIN HCL 0.4 MG PO CAPS
ORAL_CAPSULE | ORAL | Status: AC
  Filled 2011-10-08: qty 1

## 2011-10-08 MED ORDER — ADULT MULTIVITAMIN W/MINERALS CH
1.0000 | ORAL_TABLET | Freq: Every day | ORAL | Status: DC
  Administered 2011-10-08 – 2011-10-09 (×2): 1 via ORAL
  Filled 2011-10-08 (×2): qty 1

## 2011-10-08 MED ORDER — LORAZEPAM BOLUS VIA INFUSION: 1.0000 mg | INTRAVENOUS | Status: DC | PRN

## 2011-10-08 MED ORDER — MAGNESIUM SULFATE 40 MG/ML IJ SOLN
2.0000 g | Freq: Four times a day (QID) | INTRAMUSCULAR | Status: AC
  Administered 2011-10-08 (×2): 2 g via INTRAVENOUS
  Filled 2011-10-08 (×2): qty 50

## 2011-10-08 MED ORDER — PANTOPRAZOLE SODIUM 40 MG PO TBEC
40.0000 mg | DELAYED_RELEASE_TABLET | Freq: Every day | ORAL | Status: DC
  Administered 2011-10-08 – 2011-10-09 (×2): 40 mg via ORAL
  Filled 2011-10-08 (×2): qty 1

## 2011-10-08 MED ORDER — SODIUM CHLORIDE 0.9 % IJ SOLN: 3.0000 mL | INTRAMUSCULAR | Status: DC | PRN

## 2011-10-08 MED ORDER — ONDANSETRON HCL 4 MG PO TABS: 4.0000 mg | ORAL_TABLET | Freq: Four times a day (QID) | ORAL | Status: DC | PRN

## 2011-10-08 MED ORDER — POTASSIUM CHLORIDE CRYS ER 20 MEQ PO TBCR
40.0000 meq | EXTENDED_RELEASE_TABLET | Freq: Three times a day (TID) | ORAL | Status: DC
  Administered 2011-10-08 – 2011-10-09 (×4): 40 meq via ORAL
  Filled 2011-10-08 (×4): qty 2

## 2011-10-08 MED ORDER — LORAZEPAM 2 MG/ML IJ SOLN
1.0000 mg | Freq: Once | INTRAMUSCULAR | Status: AC
  Administered 2011-10-08: 1 mg via INTRAVENOUS
  Filled 2011-10-08: qty 1

## 2011-10-08 MED ORDER — SODIUM CHLORIDE 0.9 % IV SOLN: 250.0000 mL | INTRAVENOUS | Status: DC | PRN

## 2011-10-08 MED ORDER — ACETAMINOPHEN 325 MG PO TABS: 650.0000 mg | ORAL_TABLET | Freq: Four times a day (QID) | ORAL | Status: DC | PRN

## 2011-10-08 MED ORDER — ENOXAPARIN SODIUM 30 MG/0.3ML ~~LOC~~ SOLN
30.0000 mg | SUBCUTANEOUS | Status: DC
  Administered 2011-10-08: 30 mg via SUBCUTANEOUS
  Filled 2011-10-08: qty 0.3

## 2011-10-08 MED ORDER — TAMSULOSIN HCL 0.4 MG PO CAPS
0.4000 mg | ORAL_CAPSULE | Freq: Every day | ORAL | Status: DC
  Administered 2011-10-08 – 2011-10-09 (×2): 0.4 mg via ORAL
  Filled 2011-10-08 (×5): qty 1

## 2011-10-08 NOTE — ED Notes (Signed)
Pt is eating his meal tray.

## 2011-10-08 NOTE — ED Notes (Signed)
Pt was eating lunch and started having seizure.  EMS arrived to find pt on the floor postdictal, confused.  EMS says took approx 5-67min to "come to."  Denies any pain.  Pt alert and oriented at this time.

## 2011-10-08 NOTE — ED Provider Notes (Signed)
History   This chart was scribed for Tony Gaskins, MD by Clarita Crane. The patient was seen in room APA04/APA04. Patient's care was started at 1425.    CSN: [id number]  Arrival date & time 10/08/11  1425   First MD Initiated Contact with Patient 10/08/11 1426      Chief Complaint  Patient presents with  . Seizures     HPI CHRISTEN BEDOYA is a 56 y.o. male who presents to the Emergency Department BIB EMS for evaluation following seizure which occurred just prior to arrival while eating lunch but has resolved at this time. Patient currently states he is at this baseline. Per EMS, patient was found post ictal and confused and reportedly took between 5 and 7 minutes to return to baseline after arriving.  Denies HA, visual changes, chest pain, SOB, abdominal pain, tongue biting, head injury. Patient denies h/o seizures but notes h/o HTN.  He is feeling improved.  Nothing worsens his symptoms He reports h/o ETOH abuse has not had ETOH in 6 months He has no other complaints  Past Medical History  Diagnosis Date  . Seizures   . Hypertension     Past Surgical History  Procedure Date  . Throat surgery     polyps removed    No family history on file.  History  Substance Use Topics  . Smoking status: Former Games developer  . Smokeless tobacco: Not on file  . Alcohol Use: No      Review of Systems A complete 10 system review of systems was obtained and all systems are negative except as noted in the HPI and PMH.   Allergies  Penicillins  Home Medications  No current outpatient prescriptions on file.  BP 120/74  Pulse 117  Temp(Src) 98.2 F (36.8 C) (Oral)  Resp 18  Ht 6\' 3"  (1.905 m)  Wt 197 lb (89.359 kg)  BMI 24.62 kg/m2  SpO2 96%  Physical Exam CONSTITUTIONAL: Well developed/well nourished HEAD AND FACE: Normocephalic/atraumatic EYES: EOMI/PERRL ENMT: Mucous membranes moist, no tongue lacerations NECK: supple no meningeal signs SPINE:entire spine nontender CV:  S1/S2 noted, no murmurs/rubs/gallops noted LUNGS: Lungs are clear to auscultation bilaterally, no apparent distress ABDOMEN: soft, nontender, no rebound or guarding NEURO: Pt is awake/alert, moves all extremitiesx4, no focal neuro deficits, nontoxic in appearance EXTREMITIES: pulses normal, full ROM SKIN: warm, color normal PSYCH: no abnormalities of mood noted  ED Course  Procedures   DIAGNOSTIC STUDIES: Oxygen Saturation is 96% on room air, adequate by my interpretation.    COORDINATION OF CARE: 2:40PM- Patient informed of current plan for treatment and evaluation and agrees with plan at this time.  3:30 PM I spoke to his PCP dr fagan He reports pt has not been sober for months and he knows that he has been drinking which means this could be ETOH withdrawal seizure D/w patient and he is aware of need for admission D/w dr Ignacia Palma who will call for admission to dr fagan once CT head is back       MDM  Nursing notes reviewed and considered in documentation All labs/vitals reviewed and considered Previous records reviewed and considered     Date: 10/08/2011  Rate: 112  Rhythm: sinus tachycardia  QRS Axis: normal  Intervals: normal  ST/T Wave abnormalities: nonspecific ST changes  Conduction Disutrbances:none  Narrative Interpretation:   Old EKG Reviewed: unchanged     I personally performed the services described in this documentation, which was scribed in my presence. The  recorded information has been reviewed and considered.      Tony Gaskins, MD 10/08/11 [phone]

## 2011-10-08 NOTE — ED Notes (Signed)
CRITICAL VALUE ALERT  Critical value received:  Mg-0.5  Date of notification: 10/08/11  Time of notification:  1831  Critical value read back:yes  Nurse who received alert:  t abbott rn  MD notified (1st page):  [name]  Time of first page:  1831  MD notified (2nd page):  Time of second page:  Responding MD:  [name]  Time MD responded:  [phone]

## 2011-10-08 NOTE — Progress Notes (Signed)
5:10 PM CT of head was negative.  Dr. [name] informed of this result.  He will be in to see pt.

## 2011-10-09 ENCOUNTER — Encounter: Payer: Self-pay | Admitting: Cardiology

## 2011-10-09 ENCOUNTER — Observation Stay (HOSPITAL_COMMUNITY)
Admit: 2011-10-09 | Discharge: 2011-10-09 | Disposition: A | Discharge status: Home / Self Care | Payer: BC Managed Care – PPO | Attending: Internal Medicine | Admitting: Internal Medicine

## 2011-10-09 LAB — HEPATIC FUNCTION PANEL
ALT: 31 U/L (ref 0–53)
Alkaline Phosphatase: 70 U/L (ref 39–117)
Bilirubin, Direct: <0.1 mg/dL (ref 0.0–0.3)
Total Bilirubin: 0.3 mg/dL (ref 0.3–1.2)
Total Protein: 6.2 g/dL (ref 6.0–8.3)

## 2011-10-09 LAB — BASIC METABOLIC PANEL
CO2: 26 mEq/L (ref 19–32)
Calcium: 8.7 mg/dL (ref 8.4–10.5)
Creatinine, Ser: 1.21 mg/dL (ref 0.50–1.35)
GFR calc non Af Amer: 66 mL/min — ABNORMAL LOW (ref >90)
Glucose, Bld: 109 mg/dL — ABNORMAL HIGH (ref 70–99)
Sodium: 140 mEq/L (ref 135–145)

## 2011-10-09 MED ORDER — ENOXAPARIN SODIUM 40 MG/0.4ML ~~LOC~~ SOLN
40.0000 mg | SUBCUTANEOUS | Status: DC
  Administered 2011-10-09: 40 mg via SUBCUTANEOUS
  Filled 2011-10-09: qty 0.4

## 2011-10-09 MED ORDER — METOPROLOL TARTRATE 25 MG PO TABS
25.0000 mg | ORAL_TABLET | Freq: Two times a day (BID) | ORAL | Status: DC
  Administered 2011-10-09: 25 mg via ORAL
  Filled 2011-10-09: qty 1

## 2011-10-09 MED ORDER — METOPROLOL TARTRATE 50 MG PO TABS: 25.0000 mg | ORAL_TABLET | Freq: Two times a day (BID) | ORAL | Status: DC

## 2011-10-09 MED ORDER — TAMSULOSIN HCL 0.4 MG PO CAPS: 0.4000 mg | ORAL_CAPSULE | Freq: Every day | ORAL | Status: DC

## 2011-10-09 NOTE — Care Management Note (Unsigned)
    Page 1 of 1   10/09/2011     1:14:59 PM   CARE MANAGEMENT NOTE 10/09/2011  Patient:  Tony Patterson, Tony Patterson   Account Number:  [id number]  Date Initiated:  10/09/2011  Documentation initiated by:  Sharrie Rothman  Subjective/Objective Assessment:   Pt admitted from home with new onset seizures. Pt lives alone and is employeed. Pt states that he does drink occasionally but does not need any help with etoh abuse.     Action/Plan:   No CM needs noted at this time. Pt refused CSW referral at this time.   Anticipated DC Date:  10/15/2011   Anticipated DC Plan:  HOME/SELF CARE      DC Planning Services  CM consult      Choice offered to / List presented to:             Status of service:  In process, will continue to follow Medicare Important Message given?   (If response is "NO", the following Medicare IM given date fields will be blank) Date Medicare IM given:   Date Additional Medicare IM given:    Discharge Disposition:  HOME/SELF CARE  Per UR Regulation:    If discussed at Long Length of Stay Meetings, dates discussed:    Comments:  10/09/11 1310 Arlyss Queen, RN BSN CM

## 2011-10-09 NOTE — Discharge Summary (Signed)
Tony Patterson, Tony Patterson                ACCOUNT NO.:  [id number]  MEDICAL RECORD NO.:  [id number]  LOCATION:  A301                          FACILITY:  [facility]  PHYSICIAN:  Kingsley Callander. [name], MD       DATE OF BIRTH:  10-May-1956  DATE OF ADMISSION:  10/08/2011 DATE OF DISCHARGE:  05/08/2013LH                              DISCHARGE SUMMARY   DISCHARGE DIAGNOSES: 1. Possible seizure. 2. Hypertension. 3. Hypokalemia. 4. History of alcohol abuse. 5. Chronic kidney disease, stage III. 6. Gastroesophageal reflux disease. 7. Benign prostatic hypertrophy.  HOSPITAL COURSE:  This patient is a 56 year old white male with a history of intermittent alcohol abuse who presented to the emergency room after suffering a possible seizure at a local [employer]. Following the episode, he was completely neurologically intact.  He was evaluated in the emergency room where CT scan of the brain was normal. He was hospitalized for observation.  He had no recurrent seizures.  He had no neurological sequelae.  He underwent an EEG, which preliminarily appears to be normal.  He was a markedly hypomagnesemic with a magnesium level of 0.5.  He was treated with supplemental intravenous magnesium with a normalization in his level to 1.6.  He was initially mildly hypokalemic at 3.3, was supplemented to the normal range at 3.5.  He has stable renal insufficiency with an initial creatinine of 1.38, which actually normalized to 1.21.  The patient has not consumed alcohol over the past week.  He was nevertheless treated with an alcohol detox regimen of thiamine, multivitamins, magnesium, folate, and Ativan.  He showed no signs of acute withdrawal.  The patient had been previously on Wellbutrin.  In light of this episode, Wellbutrin will be stopped.  His condition has remained stable.  He is in improved condition and stable for discharge on the evening of Oct 09, 2011.  He will be seen in followup in my office in 1  week.  DISCHARGE MEDICATIONS: 1. Lopressor 25 mg b.i.d. 2. Flomax 0.4 mg daily. 3. Prilosec 20 mg daily. 4. Aspirin 81 mg daily.  He has been counseled to avoid all alcohol use.     Kingsley Callander. [name], MD     ROF/MEDQ  D:  10/09/2011  T:  10/09/2011  Job:  [id number]

## 2011-10-09 NOTE — Progress Notes (Signed)
Portable EEG completed at [facility]. 

## 2011-10-09 NOTE — H&P (Signed)
NAMEBRENTLEY, Tony Patterson                ACCOUNT NO.:  [id number]  MEDICAL RECORD NO.:  [id number]  LOCATION:  A301                          FACILITY:  [facility]  PHYSICIAN:  Kingsley Callander. [name], MD       DATE OF BIRTH:  1955-08-08  DATE OF ADMISSION:  10/08/2011 DATE OF DISCHARGE:  05/08/2013LH                             HISTORY & PHYSICAL   CHIEF COMPLAINT:  Seizure.  HISTORY OF PRESENT ILLNESS:  This patient is a 56 year old white male who was eating lunch, had the [place] grill when he reportedly experienced a seizure.  He remembers getting up to go to the buffet and then has no recollection of subsequent events.  Paramedics and fireman responded. He reportedly had generalized seizure activity.  He returned to his baseline neurological status, and now has no complaints.  He is fully alert and oriented.  He denies tongue biting.  He has not experienced any vomiting.  He experienced no urinary or fecal incontinence.  He denies any recent head trauma.  He has a history of alcohol abuse with periodic binge drinking.  His last episode was 1 week ago he states.  PAST MEDICAL HISTORY: 1. Hypertension. 2. Alcohol abuse. 3. Proteinuria. 4. Hypertriglyceridemia. 5. Tubulovillous adenoma. 6. Chronic kidney disease. 7. BPH. 8. GERD.  MEDICATIONS:  Prilosec 20 mg daily, Flomax 0.4 mg daily, Wellbutrin XL 300 mg daily, aspirin 81 mg daily.  ALLERGIES:  Penicillin.  SOCIAL HISTORY:  He does not smoke cigarettes or use drugs.  FAMILY HISTORY:  His father had coronary heart disease and CHF.  His mother has had temporal arteritis, hypertension, and osteoporosis.  REVIEW OF SYSTEMS:  No headache, fever, chills, chest pain, shortness of breath, vomiting, change in bowel habits, difficulty voiding.  PHYSICAL EXAMINATION:  VITAL SIGNS:  Temperature 97.8, pulse 96, respirations 18, blood pressure 159/102.  GENERAL:  Alert, oriented, and in no distress.  HEENT:  Eyes reveal no scleral icterus.  The nose  and oropharynx are unremarkable.  NECK:  Supple with no JVD or thyromegaly.  LUNGS:  Clear.  HEART:  Regular with no murmurs.  ABDOMEN:  Soft and nontender with no palpable organomegaly.  EXTREMITIES:  No cyanosis, clubbing, or edema.  NEURO:  No focal weakness.  Face is symmetric.  Speech is normal.  No tremor is noted.  LYMPH NODES:  No cervical or supraclavicular enlargement.  SKIN:  Warm and dry.  LABORATORY DATA:  Sodium 141, potassium 3.3, bicarb 26, BUN 13, creatinine 1.38, glucose 140, magnesium 0.5.  White count 4.2, hemoglobin 12.1, platelets 162,000.  AST 38, ALT 31, alkaline phosphatase 70.  A CT scan of the head reveals no acute intracranial abnormality.  EKG reveals normal sinus rhythm, sinus tachycardia at 112 beats per minute.  IMPRESSION/PLAN: 1. Seizure versus syncopal episode.  He will be hospitalized and     observed.  Serial neurologic checks will be obtained.  An EEG will     be performed. 2. Hypomagnesemia, replace intravenously. 3. Hypokalemia, replace orally. 4. Chronic kidney disease, stable. 5. History of alcohol abuse.  We will treat with an alcohol detox     regimen.  He shows no signs of withdrawal  at this point. 6. Mild normocytic anemia. 7. Mild transaminase elevation.  His SGOT was 89 and SGPT was 73 in     March.     Kingsley Callander. [name], MD     ROF/MEDQ  D:  10/09/2011  T:  10/09/2011  Job:  [id number]

## 2011-10-11 NOTE — Procedures (Signed)
NAME:  Tony Patterson, Tony Patterson                ACCOUNT NO.:  [id number]  MEDICAL RECORD NO.:  [id number]  LOCATION:  A301                          FACILITY:  [facility]  PHYSICIAN:  Briyah Wheelwright A. Gerilyn Pilgrim, M.D. DATE OF BIRTH:  [birth year]  DATE OF PROCEDURE:  10/09/2011 DATE OF DISCHARGE:  10/09/2011                             EEG INTERPRETATION   INDICATION:  A 56 year old man who presents with a history of seizures, had a spell of unresponsiveness recently.  MEDICATIONS:  Lovenox, folic acid, magnesium, Flomax, potassium, Lopressor, thiamine, Wellbutrin.  ANALYSIS:  A 16 channel recording using standard October, 10/20 measurements is conducted for 20 minutes.  There is a well-formed posterior dominant rhythm of 8.5 Hz which attenuates with eye opening. There is beta activity observed in the frontal areas.  Awake and drowsy activities are recorded.  Photic stimulation and hyperventilation are carried out without abnormal changes in the background activity.  There is no focal or lateralized slowing.  There is no epileptiform activity.  IMPRESSION:  Normal recording of awake and drowsy states.     Craig Ionescu A. Gerilyn Pilgrim, M.D.     KAD/MEDQ  D:  10/10/2011  T:  10/11/2011  Job:  [id number]

## 2011-11-02 ENCOUNTER — Encounter (HOSPITAL_COMMUNITY): Payer: Self-pay

## 2011-11-02 ENCOUNTER — Emergency Department (HOSPITAL_COMMUNITY): Payer: BC Managed Care – PPO

## 2011-11-02 ENCOUNTER — Emergency Department (HOSPITAL_COMMUNITY)
Admission: EM | Admit: 2011-11-02 | Discharge: 2011-11-02 | Disposition: A | Discharge status: Home / Self Care | Payer: BC Managed Care – PPO | Attending: Emergency Medicine | Admitting: Emergency Medicine

## 2011-11-02 DIAGNOSIS — I10 Essential (primary) hypertension: Secondary | ICD-10-CM | POA: Insufficient documentation

## 2011-11-02 DIAGNOSIS — F1010 Alcohol abuse, uncomplicated: Secondary | ICD-10-CM | POA: Insufficient documentation

## 2011-11-02 DIAGNOSIS — F10929 Alcohol use, unspecified with intoxication, unspecified: Secondary | ICD-10-CM

## 2011-11-02 DIAGNOSIS — M5030 Other cervical disc degeneration, unspecified cervical region: Secondary | ICD-10-CM | POA: Insufficient documentation

## 2011-11-02 DIAGNOSIS — IMO0002 Reserved for concepts with insufficient information to code with codable children: Secondary | ICD-10-CM | POA: Insufficient documentation

## 2011-11-02 DIAGNOSIS — R42 Dizziness and giddiness: Secondary | ICD-10-CM | POA: Insufficient documentation

## 2011-11-02 DIAGNOSIS — F1910 Other psychoactive substance abuse, uncomplicated: Secondary | ICD-10-CM | POA: Insufficient documentation

## 2011-11-02 DIAGNOSIS — Y92009 Unspecified place in unspecified non-institutional (private) residence as the place of occurrence of the external cause: Secondary | ICD-10-CM | POA: Insufficient documentation

## 2011-11-02 DIAGNOSIS — S022XXA Fracture of nasal bones, initial encounter for closed fracture: Secondary | ICD-10-CM | POA: Insufficient documentation

## 2011-11-02 DIAGNOSIS — S0081XA Abrasion of other part of head, initial encounter: Secondary | ICD-10-CM

## 2011-11-02 DIAGNOSIS — W19XXXA Unspecified fall, initial encounter: Secondary | ICD-10-CM

## 2011-11-02 DIAGNOSIS — Z79899 Other long term (current) drug therapy: Secondary | ICD-10-CM | POA: Insufficient documentation

## 2011-11-02 DIAGNOSIS — W1789XA Other fall from one level to another, initial encounter: Secondary | ICD-10-CM | POA: Insufficient documentation

## 2011-11-02 NOTE — ED Notes (Signed)
Pt alert & oriented x4, Pt carried out in wheelchair & in waiting room waiting on ride. Pt given discharge instructions, paperwork. Patient instructed to stop at the registration desk to finish any additional paperwork. pt verbalized understanding. Pt left department w/ no further questions.

## 2011-11-02 NOTE — Discharge Instructions (Signed)
CT scan of head neck and face without any significant injuries other than questionable nasal bone fracture could be old. Recommend you followup with your nose and throat sometime in the next week for that for reevaluation. Return for new or worse symptoms. Would treat abrasions to the left side of the face with bacitracin ointment or for soap and water daily.

## 2011-11-02 NOTE — ED Notes (Signed)
Pt brought to er by ems, large amount of etoh last night and today, stated he fell and thinks he has a "concussion", unsure of loc

## 2011-11-02 NOTE — ED Provider Notes (Signed)
History  This chart was scribed for Shelda Jakes, MD by Bennett Scrape. This patient was seen in room [facility]03/[facility]03 and the patient's care was started at 6:07PM.  CSN: [id number]  Arrival date & time 11/02/11  1728   First MD Initiated Contact with Patient 11/02/11 1807      Chief Complaint  Patient presents with  . Fall    Patient is a 56 y.o. male presenting with fall. The history is provided by the patient. No language interpreter was used.  Fall The fall occurred while standing. He fell from an unknown height. He landed on grass. The volume of blood lost was minimal. Point of impact: Left-side of body. He was ambulatory at the scene. There was alcohol use involved in the accident. Pertinent negatives include no fever, no abdominal pain, no nausea, no vomiting and no headaches.    Tony Patterson is a 56 y.o. male with a h/o seizures and HTN brought in by ambulance, who presents to the Emergency Department complaining of a fall that occurred sometime yesterday night or today. Pt states that he fell off his front porch after drinking a large amount of alcohol landing on the left-side of his body. He is unsure of how he fell and what body part he landed on. He is unsure of LOC. He denies pain currently. He has visible abrasions to his forehead, nose and cheek as well as his left forearm. He denies chest pain, abdominal pain and nausea as associated symptoms. He is a former smoker and daily alcohol user.  PCP is Dr. [name].  Past Medical History  Diagnosis Date  . Seizures   . Hypertension     Past Surgical History  Procedure Date  . Throat surgery     polyps removed    No family history on file.  History  Substance Use Topics  . Smoking status: Former Games developer  . Smokeless tobacco: Not on file  . Alcohol Use: Yes     daily      Review of Systems  Constitutional: Negative for fever and chills.  HENT: Negative for congestion and neck pain.   Eyes: Negative for visual  disturbance.  Respiratory: Negative for shortness of breath.   Cardiovascular: Negative for chest pain.  Gastrointestinal: Negative for nausea, vomiting and abdominal pain.  Genitourinary: Negative for dysuria.  Musculoskeletal: Negative for back pain.  Skin: Positive for wound (abrasions to the left side of face and left forearm).  Neurological: Negative for weakness and headaches.  Hematological: Does not bruise/bleed easily.    Allergies  Bee venom and Penicillins  Home Medications   Current Outpatient Rx  Name Route Sig Dispense Refill  . ASPIRIN EC 81 MG PO TBEC Oral Take 81 mg by mouth daily.    Marland Kitchen HYDROCHLOROTHIAZIDE 25 MG PO TABS Oral Take 25 mg by mouth daily.    Marland Kitchen LOSARTAN POTASSIUM 100 MG PO TABS Oral Take 100 mg by mouth daily.    Marland Kitchen MAGNESIUM OXIDE 400 MG PO TABS Oral Take 400 mg by mouth at bedtime.    Marland Kitchen METOPROLOL TARTRATE 50 MG PO TABS Oral Take 25 mg by mouth 2 (two) times daily. For systolic blood pressure above 90    . OMEPRAZOLE 20 MG PO CPDR Oral Take 20 mg by mouth every morning.    Marland Kitchen SIMVASTATIN 20 MG PO TABS Oral Take 20 mg by mouth every evening.    Marland Kitchen TAMSULOSIN HCL 0.4 MG PO CAPS Oral Take 1 capsule (0.4  mg total) by mouth daily. 30 capsule 12  . TRAMADOL HCL 50 MG PO TABS Oral Take 50 mg by mouth 3 (three) times daily as needed. pain      Triage Vitals: BP 134/71  Pulse 95  Temp(Src) 98.5 F (36.9 C) (Oral)  Resp 20  Ht 6\' 3"  (1.905 m)  Wt 200 lb (90.719 kg)  BMI 25.00 kg/m2  SpO2 100%  Physical Exam  Nursing note and vitals reviewed. Constitutional: He is oriented to person, place, and time. He appears well-developed and well-nourished. No distress.  HENT:  Head: Normocephalic.       Moist mucus membranes  Eyes: EOM are normal.  Neck: Neck supple. No tracheal deviation present.       Non-tender  Cardiovascular: Normal rate and regular rhythm.   No murmur heard. Pulmonary/Chest: Effort normal and breath sounds normal. No respiratory distress.   Abdominal: Bowel sounds are normal. There is no tenderness.  Musculoskeletal: Normal range of motion. He exhibits no tenderness.       Back is non-tender, able to wiggle fingers and toes  Neurological: He is alert and oriented to person, place, and time.  Skin: Skin is warm and dry.       3 areas of ecchymosis around left wrist, cap refill is 1 second, abrasions on back of left forearm measuring 7 cm in size, abrasions on the forehead, left side of left eye and left side of nose, no lacerations  Psychiatric: He has a normal mood and affect. His behavior is normal.    ED Course  Procedures (including critical care time)  DIAGNOSTIC STUDIES: Oxygen Saturation is 100% on room air, normal by my interpretation.    COORDINATION OF CARE: 6:44PM-Discussed treatment plan which includes CT scan of head and neck with pt and pt agreed to plan. Results for orders placed during the hospital encounter of 10/08/11  CBC      Component Value Range   WBC 4.2  4.0 - 10.5 (K/uL)   RBC 4.06 (*) 4.22 - 5.81 (MIL/uL)   Hemoglobin 12.1 (*) 13.0 - 17.0 (g/dL)   HCT 16.1 (*) 09.6 - 52.0 (%)   MCV 91.4  78.0 - 100.0 (fL)   MCH 29.8  26.0 - 34.0 (pg)   MCHC 32.6  30.0 - 36.0 (g/dL)   RDW 04.5 (*) 40.9 - 15.5 (%)   Platelets 162  150 - 400 (K/uL)  DIFFERENTIAL      Component Value Range   Neutrophils Relative 66  43 - 77 (%)   Neutro Abs 2.8  1.7 - 7.7 (K/uL)   Lymphocytes Relative 24  12 - 46 (%)   Lymphs Abs 1.0  0.7 - 4.0 (K/uL)   Monocytes Relative 7  3 - 12 (%)   Monocytes Absolute 0.3  0.1 - 1.0 (K/uL)   Eosinophils Relative 2  0 - 5 (%)   Eosinophils Absolute 0.1  0.0 - 0.7 (K/uL)   Basophils Relative 1  0 - 1 (%)   Basophils Absolute 0.0  0.0 - 0.1 (K/uL)  BASIC METABOLIC PANEL      Component Value Range   Sodium 141  135 - 145 (mEq/L)   Potassium 3.3 (*) 3.5 - 5.1 (mEq/L)   Chloride 102  96 - 112 (mEq/L)   CO2 26  19 - 32 (mEq/L)   Glucose, Bld 140 (*) 70 - 99 (mg/dL)   BUN 13  6 - 23  (mg/dL)   Creatinine, Ser 8.11 (*) 0.50 -  1.35 (mg/dL)   Calcium 8.9  8.4 - 11.9 (mg/dL)   GFR calc non Af Amer 57 (*) >90 (mL/min)   GFR calc Af Amer 66 (*) >90 (mL/min)  GLUCOSE, CAPILLARY      Component Value Range   Glucose-Capillary 131 (*) 70 - 99 (mg/dL)  MAGNESIUM      Component Value Range   Magnesium 0.5 (*) 1.5 - 2.5 (mg/dL)  BASIC METABOLIC PANEL      Component Value Range   Sodium 140  135 - 145 (mEq/L)   Potassium 3.5  3.5 - 5.1 (mEq/L)   Chloride 105  96 - 112 (mEq/L)   CO2 26  19 - 32 (mEq/L)   Glucose, Bld 109 (*) 70 - 99 (mg/dL)   BUN 10  6 - 23 (mg/dL)   Creatinine, Ser 1.47  0.50 - 1.35 (mg/dL)   Calcium 8.7  8.4 - 82.9 (mg/dL)   GFR calc non Af Amer 66 (*) >90 (mL/min)   GFR calc Af Amer 77 (*) >90 (mL/min)  MAGNESIUM      Component Value Range   Magnesium 1.6  1.5 - 2.5 (mg/dL)  HEPATIC FUNCTION PANEL      Component Value Range   Total Protein 6.2  6.0 - 8.3 (g/dL)   Albumin 3.1 (*) 3.5 - 5.2 (g/dL)   AST 38 (*) 0 - 37 (U/L)   ALT 31  0 - 53 (U/L)   Alkaline Phosphatase 70  39 - 117 (U/L)   Total Bilirubin 0.3  0.3 - 1.2 (mg/dL)   Bilirubin, Direct <5.6  0.0 - 0.3 (mg/dL)   Indirect Bilirubin NOT CALCULATED  0.3 - 0.9 (mg/dL)   Ct Head Wo Contrast  10/08/2011  *RADIOLOGY REPORT*  Clinical Data: Hypertension, dizziness, seizure  CT HEAD WITHOUT CONTRAST  Technique:  Contiguous axial images were obtained from the base of the skull through the vertex without contrast.  Comparison: None.  Findings: Normal ventricular morphology. No midline shift or mass effect. Otherwise normal appearance of brain parenchyma. No intracranial hemorrhage, mass lesion or evidence of acute infarction. No extra-axial fluid collection. Visualized paranasal sinuses and mastoid air cells clear. Skull unremarkable.  IMPRESSION: No acute intracranial abnormalities.  Original Report Authenticated By: Lollie Marrow, M.D.    Today's x-ray CT head neck and face without significant injuries  except for questionable nasal bone fracture this could be old. The studies did not crossover but read on the PACS system.  Labs Reviewed - No data to display No results found.   1. Fall   2. Facial abrasion   3. Nasal fracture   4. Alcohol intoxication       MDM  Patient functional despite the alcohol intoxication CT scan head neck and face without sniffing injuries other than maybe a left nasal bone fracture patient can followup with ear nose and throat referral provided. Abrasions be treated with soap and water and asked trace ointment.      I personally performed the services described in this documentation, which was scribed in my presence. The recorded information has been reviewed and considered.     Shelda Jakes, MD 11/02/11 2035

## 2013-05-06 ENCOUNTER — Encounter (HOSPITAL_COMMUNITY): Payer: Self-pay | Admitting: Emergency Medicine

## 2013-05-06 ENCOUNTER — Emergency Department (HOSPITAL_COMMUNITY)
Admission: EM | Admit: 2013-05-06 | Discharge: 2013-05-06 | Disposition: A | Discharge status: Home / Self Care | Payer: BC Managed Care – PPO | Attending: Emergency Medicine | Admitting: Emergency Medicine

## 2013-05-06 DIAGNOSIS — R52 Pain, unspecified: Secondary | ICD-10-CM | POA: Insufficient documentation

## 2013-05-06 DIAGNOSIS — Z880 Allergy status to penicillin: Secondary | ICD-10-CM | POA: Insufficient documentation

## 2013-05-06 DIAGNOSIS — I10 Essential (primary) hypertension: Secondary | ICD-10-CM | POA: Insufficient documentation

## 2013-05-06 DIAGNOSIS — Z8669 Personal history of other diseases of the nervous system and sense organs: Secondary | ICD-10-CM | POA: Insufficient documentation

## 2013-05-06 DIAGNOSIS — Z7982 Long term (current) use of aspirin: Secondary | ICD-10-CM | POA: Insufficient documentation

## 2013-05-06 DIAGNOSIS — Z87891 Personal history of nicotine dependence: Secondary | ICD-10-CM | POA: Insufficient documentation

## 2013-05-06 DIAGNOSIS — R112 Nausea with vomiting, unspecified: Secondary | ICD-10-CM | POA: Insufficient documentation

## 2013-05-06 DIAGNOSIS — Z79899 Other long term (current) drug therapy: Secondary | ICD-10-CM | POA: Insufficient documentation

## 2013-05-06 DIAGNOSIS — R197 Diarrhea, unspecified: Secondary | ICD-10-CM | POA: Insufficient documentation

## 2013-05-06 LAB — URINALYSIS, ROUTINE W REFLEX MICROSCOPIC
Bilirubin Urine: NEGATIVE
Glucose, UA: NEGATIVE mg/dL
Hgb urine dipstick: NEGATIVE
Protein, ur: 30 mg/dL — AB
Specific Gravity, Urine: 1.01 (ref 1.005–1.030)

## 2013-05-06 LAB — COMPREHENSIVE METABOLIC PANEL
ALT: 59 U/L — ABNORMAL HIGH (ref 0–53)
AST: 148 U/L — ABNORMAL HIGH (ref 0–37)
Albumin: 3.8 g/dL (ref 3.5–5.2)
CO2: 26 mEq/L (ref 19–32)
Chloride: 95 mEq/L — ABNORMAL LOW (ref 96–112)
Creatinine, Ser: 1.01 mg/dL (ref 0.50–1.35)
GFR calc non Af Amer: 81 mL/min — ABNORMAL LOW (ref >90)
Sodium: 136 mEq/L (ref 135–145)
Total Bilirubin: 1 mg/dL (ref 0.3–1.2)

## 2013-05-06 LAB — CBC WITH DIFFERENTIAL/PLATELET
Basophils Absolute: 0 10*3/uL (ref 0.0–0.1)
Basophils Relative: 0 % (ref 0–1)
HCT: 39 % (ref 39.0–52.0)
Lymphocytes Relative: 9 % — ABNORMAL LOW (ref 12–46)
MCHC: 32.8 g/dL (ref 30.0–36.0)
Monocytes Absolute: 0.4 10*3/uL (ref 0.1–1.0)
Neutro Abs: 3.3 10*3/uL (ref 1.7–7.7)
Neutrophils Relative %: 80 % — ABNORMAL HIGH (ref 43–77)
Platelets: 122 10*3/uL — ABNORMAL LOW (ref 150–400)
RDW: 15.8 % — ABNORMAL HIGH (ref 11.5–15.5)
WBC: 4.1 10*3/uL (ref 4.0–10.5)

## 2013-05-06 LAB — URINE MICROSCOPIC-ADD ON: Urine-Other: NONE SEEN

## 2013-05-06 MED ORDER — SODIUM CHLORIDE 0.9 % IV SOLN
Freq: Once | INTRAVENOUS | Status: AC
  Administered 2013-05-06: 09:00:00 via INTRAVENOUS

## 2013-05-06 MED ORDER — ONDANSETRON 8 MG PO TBDP: 8.0000 mg | ORAL_TABLET | Freq: Three times a day (TID) | ORAL | Status: DC | PRN

## 2013-05-06 MED ORDER — ONDANSETRON HCL 4 MG/2ML IJ SOLN
4.0000 mg | Freq: Once | INTRAMUSCULAR | Status: AC
  Administered 2013-05-06: 4 mg via INTRAVENOUS
  Filled 2013-05-06: qty 2

## 2013-05-06 MED ORDER — SODIUM CHLORIDE 0.9 % IV BOLUS (SEPSIS)
500.0000 mL | Freq: Once | INTRAVENOUS | Status: AC
  Administered 2013-05-06: 500 mL via INTRAVENOUS

## 2013-05-06 NOTE — ED Notes (Signed)
Patient unable to provide urine specimen at this time.  Advised patient when he could to let tech or nurse know.

## 2013-05-06 NOTE — ED Notes (Signed)
Patient with c/o generalized body aches, diarrhea since Tuesday. Reports vomiting x 2 today. Patient denies abdominal pain, reports drinking normally and able to keep fluids down.

## 2013-05-06 NOTE — ED Provider Notes (Signed)
CSN: [id number]     Arrival date & time 05/06/13  4098 History   First MD Initiated Contact with Patient 05/06/13 0801     Chief Complaint  Patient presents with  . Generalized Body Aches  . Diarrhea   (Consider location/radiation/quality/duration/timing/severity/associated sxs/prior Treatment) Patient is a 57 y.o. male presenting with diarrhea. The history is provided by the patient.  Diarrhea Quality:  Watery Severity:  Mild Onset quality:  Sudden Number of episodes:  > 5-6 Duration:  2 days Timing:  Intermittent Progression:  Unchanged Relieved by:  Nothing Worsened by:  Liquids Ineffective treatments:  None tried Associated symptoms: vomiting   Associated symptoms: no abdominal pain, no arthralgias, no chills, no recent cough, no diaphoresis, no fever, no headaches, no myalgias and no URI   Vomiting:    Quality:  Stomach contents and bilious material   Number of occurrences:  2   Severity:  Mild   Duration:  6 hours   Timing:  Intermittent   Progression:  Unchanged Risk factors: no recent antibiotic use, no sick contacts and no suspicious food intake     Patient reports having intermittent episodes of diarrhea for 2 days and 2 episodes of vomiting this morning.  He states that he has been able to tolerate fluids.  He denies any pain, shortness of breath, dizziness, fever, dysuria or recent antibiotics.  He also denies hematochezia, melena, or hematemesis.    Patient has an appt with his PMD tomorrow.    Past Medical History  Diagnosis Date  . Seizures   . Hypertension    Past Surgical History  Procedure Laterality Date  . Throat surgery      polyps removed   No family history on file. History  Substance Use Topics  . Smoking status: Former Games developer  . Smokeless tobacco: Not on file  . Alcohol Use: Yes     Comment: daily    Review of Systems  Constitutional: Negative for fever, chills, diaphoresis, activity change and appetite change.  HENT: Negative for  sore throat and trouble swallowing.   Respiratory: Negative for chest tightness, shortness of breath and wheezing.   Cardiovascular: Negative for chest pain.  Gastrointestinal: Positive for nausea, vomiting and diarrhea. Negative for abdominal pain, blood in stool, abdominal distention and anal bleeding.  Genitourinary: Negative for dysuria, hematuria, flank pain, decreased urine volume and difficulty urinating.  Musculoskeletal: Negative for arthralgias, back pain and myalgias.  Skin: Negative for color change and rash.  Neurological: Negative for dizziness, syncope, weakness, numbness and headaches.  Hematological: Negative for adenopathy.  All other systems reviewed and are negative.    Allergies  Bee venom and Penicillins  Home Medications   Current Outpatient Rx  Name  Route  Sig  Dispense  Refill  . aspirin EC 81 MG tablet   Oral   Take 81 mg by mouth daily.         . hydrochlorothiazide (HYDRODIURIL) 25 MG tablet   Oral   Take 25 mg by mouth daily.         Marland Kitchen losartan (COZAAR) 100 MG tablet   Oral   Take 100 mg by mouth daily.         . magnesium oxide (MAG-OX) 400 MG tablet   Oral   Take 400 mg by mouth at bedtime.         . metoprolol (LOPRESSOR) 50 MG tablet   Oral   Take 25 mg by mouth 2 (two) times daily. For systolic blood  pressure above 90         . omeprazole (PRILOSEC) 20 MG capsule   Oral   Take 20 mg by mouth every morning.         . simvastatin (ZOCOR) 20 MG tablet   Oral   Take 20 mg by mouth every evening.         . Tamsulosin HCl (FLOMAX) 0.4 MG CAPS   Oral   Take 1 capsule (0.4 mg total) by mouth daily.   30 capsule   12   . traMADol (ULTRAM) 50 MG tablet   Oral   Take 50 mg by mouth 3 (three) times daily as needed. pain          BP 144/91  Pulse 90  Temp(Src) 98.5 F (36.9 C) (Oral)  Resp 18  Ht 6\' 3"  (1.905 m)  Wt 205 lb (92.987 kg)  BMI 25.62 kg/m2  SpO2 97% Physical Exam  Nursing note and vitals  reviewed. Constitutional: He is oriented to person, place, and time. He appears well-developed and well-nourished. No distress.  HENT:  Head: Normocephalic and atraumatic.  Mouth/Throat: Uvula is midline and oropharynx is clear and moist. Mucous membranes are dry.  Neck: Normal range of motion. Neck supple. No JVD present. No thyromegaly present.  Cardiovascular: Normal rate, regular rhythm, normal heart sounds and intact distal pulses.   No murmur heard. Pulmonary/Chest: Effort normal and breath sounds normal. No respiratory distress. He exhibits no tenderness.  Abdominal: Soft. Bowel sounds are normal. He exhibits no distension and no mass. There is no tenderness. There is no rebound and no guarding.  Musculoskeletal: Normal range of motion.  Lymphadenopathy:    He has no cervical adenopathy.  Neurological: He is alert and oriented to person, place, and time. He exhibits normal muscle tone. Coordination normal.  Skin: Skin is warm.  Psychiatric: He has a normal mood and affect. His behavior is normal.    ED Course  Procedures (including critical care time) Labs Review Labs Reviewed  CBC WITH DIFFERENTIAL - Abnormal; Notable for the following:    Hemoglobin 12.8 (*)    RDW 15.8 (*)    Platelets 122 (*)    Neutrophils Relative % 80 (*)    Lymphocytes Relative 9 (*)    Lymphs Abs 0.4 (*)    All other components within normal limits  COMPREHENSIVE METABOLIC PANEL - Abnormal; Notable for the following:    Chloride 95 (*)    Glucose, Bld 103 (*)    AST 148 (*)    ALT 59 (*)    GFR calc non Af Amer 81 (*)    All other components within normal limits  URINALYSIS, ROUTINE W REFLEX MICROSCOPIC - Abnormal; Notable for the following:    Ketones, ur 15 (*)    Protein, ur 30 (*)    All other components within normal limits  URINE MICROSCOPIC-ADD ON   Imaging Review No results found.  EKG Interpretation   None       MDM   0955  Patient is resting comfortably, non-toxic  appearing.  Feeling better,  Some labs still pending.  Will try oral fluid challenge.   1150  Patient is feeling better, has received IVF's, tolerating po's.  Discussed lab results with patient.  He mentioned that his liver enzymes has been elevated in past. Patient has appt with PMD,  Dr. [name] tomorrow 05/07/13.  Patient appears stable for discharge.  Abdomen remains soft, NT.  Likely viral illness.  No  concerning sx's for acute abdomen.  Will prescribe zofran , pt agrees to clear fluids today then bland diet as tolerated   Ramonte Mena L. Trisha Mangle, PA-C 05/08/13 [id number]

## 2013-05-09 NOTE — ED Provider Notes (Signed)
Medical screening examination/treatment/procedure(s) were performed by non-physician practitioner and as supervising physician I was immediately available for consultation/collaboration.  EKG Interpretation   None        Jamielee Mchale F Karie Skowron, MD 05/09/13 2203 

## 2013-06-16 ENCOUNTER — Telehealth: Payer: Self-pay

## 2013-06-16 NOTE — Telephone Encounter (Signed)
PT was referred by Dr. Ouida SillsFagan for screening colonoscopy. His last one was 05/18/2007 by Dr. Jena Gaussourk. He had tubulovillous adenoma. Needs OV. I called and LMOM for him to call and schedule OV appt.

## 2013-07-22 NOTE — Telephone Encounter (Signed)
Pt is scheduled OV with Gerrit HallsAnna Sams, NP on 07/29/2013 at 8:00 AM.

## 2013-07-29 ENCOUNTER — Ambulatory Visit: Payer: BC Managed Care – PPO | Admitting: Gastroenterology

## 2013-08-19 ENCOUNTER — Ambulatory Visit: Payer: BC Managed Care – PPO | Admitting: Gastroenterology

## 2013-09-16 ENCOUNTER — Ambulatory Visit: Payer: BC Managed Care – PPO | Admitting: Gastroenterology

## 2014-03-07 ENCOUNTER — Ambulatory Visit (INDEPENDENT_AMBULATORY_CARE_PROVIDER_SITE_OTHER): Payer: 59 | Admitting: Neurology

## 2014-03-07 ENCOUNTER — Encounter (INDEPENDENT_AMBULATORY_CARE_PROVIDER_SITE_OTHER): Payer: Self-pay

## 2014-03-07 ENCOUNTER — Encounter: Payer: Self-pay | Admitting: Neurology

## 2014-03-07 VITALS — BP 161/98 | HR 80 | Ht 75.0 in | Wt 196.0 lb

## 2014-03-07 DIAGNOSIS — G609 Hereditary and idiopathic neuropathy, unspecified: Secondary | ICD-10-CM

## 2014-03-07 DIAGNOSIS — F1010 Alcohol abuse, uncomplicated: Secondary | ICD-10-CM

## 2014-03-07 MED ORDER — GABAPENTIN 300 MG PO CAPS: 300.0000 mg | ORAL_CAPSULE | Freq: Three times a day (TID) | ORAL | Status: DC

## 2014-03-07 NOTE — Patient Instructions (Signed)
Overall you are doing fairly well but I do want to suggest a few things today:   Remember to drink plenty of fluid, eat healthy meals and do not skip any meals. Try to eat protein with a every meal and eat a healthy snack such as fruit or nuts in between meals. Try to keep a regular sleep-wake schedule and try to exercise daily, particularly in the form of walking, 20-30 minutes a day, if you can.   As far as diagnostic testing: Lab tests and EMG/NCS  I would like to see you back in 3 months, sooner if we need to. Please call us with any interim questions, concerns, problems, updates or refill requests.   Please also call us for any test results so we can go over those with you on the phone.  My clinical assistant and will answer any of your questions and relay your messages to me and also relay most of my messages to you.   Our phone number is [phone]. We also have an after hours call service for urgent matters and there is a physician on-call for urgent questions. For any emergencies you know to call 911 or go to the nearest emergency room

## 2014-03-07 NOTE — Progress Notes (Signed)
[facility]    Provider:  Dr Lucia Gaskins Referring Provider: Carylon Perches, MD Primary Care Physician:  Carylon Perches, MD  CC:  Numbness  HPI:  Tony Patterson is a 59 y.o. male here as a referral from Dr. [name] for peripheral neuropathy  Feet have been numb for a year. Progressing. Also tingling. Not burning. Mainly in the toes and balls of feet. Worse when working or on feet. Has LBP and radicular symptoms. And cramping in the toes at night. Balance is worsening. Hasn't taken any medication to try and help with the symptoms. Getting off his feet makes the pain better. Severe and symptoms can get up to 8/10 in pain. No Fhx of neuromuscular disorders or CMT or neuropathy. Denies ever taking medications for chemotherapy, no medications for gout, no isoniazid, flagyl or any new medications at onset. No inciting factors. He has 3 drinks every weekday of bourbon and a pint daily on the weekends. He is falling, tripping. He rolls his ankles. Had a seizure one year ago and was taken to [facility], may have been alcohol related. No episodes for a year since taken to [facility] per patient.   Reviewed notes, labs and imaging from outside physicians, which showed: PMHx of CKD, Depression, HLD, HTN and alcohol abuse with continuous drinking behavior. Dr. [name] notes continuous alcholic consumption with elevated liver enzymes, drinks bourbon, HTN and HLD well controlled on metoprolol and statin. Last CBC and CMP significant for ALT 58 and AST 148 with a GFR of 81. Personally reviewed CT of the head from 11/2011, no large ischemic events, masses or tumors some questionably increased atrophy at the cerebellar vermis more than stated age.    Review of Systems: Patient complains of symptoms per HPI as well as the following symptoms numbness, seizures, cramps, not enough sleep. Pertinent negatives per HPI. All others negative.   History   Social History  . Marital Status: Divorced    Spouse Name:  N/A    Number of Children: 0  . Years of Education: college 1   Occupational History  . grocery     [employer]   Social History Main Topics  . Smoking status: Former Games developer  . Smokeless tobacco: Never Used  . Alcohol Use: 18.0 oz/week    30 Shots of liquor per week     Comment: daily liquor; beer occasionally  . Drug Use: No  . Sexual Activity: Not on file   Other Topics Concern  . Not on file   Social History Narrative  . No narrative on file    Family History  Problem Relation Age of Onset  . Fibromyalgia Mother   . Dementia Mother   . Congestive Heart Failure Mother   . Congestive Heart Failure Father   . Heart Problems Father   . Hypertension Brother   . Hypertension Brother   . Neuropathy Neg Hx     Past Medical History  Diagnosis Date  . Seizures   . Hypertension   . Anxiety   . Alcohol abuse     Past Surgical History  Procedure Laterality Date  . Throat surgery      polyps removed x2  . Colonoscopy  05/18/2007    [id number] hemorrhoids, a single anal papilla, otherwise normal/ Single polyp, as described above, removed     Current Outpatient Prescriptions  Medication Sig Dispense Refill  . aspirin EC 81 MG tablet Take 81 mg by mouth daily.      Marland Kitchen  hydrochlorothiazide (HYDRODIURIL) 25 MG tablet Take 25 mg by mouth daily.      . magnesium oxide (MAG-OX) 400 MG tablet Take 400 mg by mouth at bedtime.      . metoprolol (LOPRESSOR) 50 MG tablet Take 50 mg by mouth 2 (two) times daily. For systolic blood pressure above 90      . omeprazole (PRILOSEC) 20 MG capsule Take 20 mg by mouth every morning.      . ondansetron (ZOFRAN ODT) 8 MG disintegrating tablet Take 1 tablet (8 mg total) by mouth every 8 (eight) hours as needed for nausea or vomiting.  15 tablet  0  . simvastatin (ZOCOR) 20 MG tablet Take 20 mg by mouth every evening.      . tamsulosin (FLOMAX) 0.4 MG CAPS capsule Take 0.4 mg by mouth daily.      Marland Kitchen losartan (COZAAR) 100 MG tablet Take  100 mg by mouth daily.       No current facility-administered medications for this visit.    Allergies as of 03/07/2014 - Review Complete 03/07/2014  Allergen Reaction Noted  . Bee venom Anaphylaxis 10/08/2011  . Penicillins  10/08/2011    Vitals: BP 161/98  Pulse 80  Ht 6\' 3"  (1.905 m)  Wt 196 lb (88.905 kg)  BMI 24.50 kg/m2 Last Weight:  Wt Readings from Last 1 Encounters:  03/07/14 196 lb (88.905 kg)   Last Height:   Ht Readings from Last 1 Encounters:  03/07/14 6\' 3"  (1.905 m)    Physical exam: Exam: Gen: NAD, conversant, well nourised, overweight, well groomed                     CV: RRR, no MRG.  Eyes: Conjunctivae clear without exudates or hemorrhage  Neuro: Detailed Neurologic Exam  Speech:    Speech is normal; fluent and spontaneous with normal comprehension.  Cognition:    The patient is oriented to person, place, and time;     recent and remote memory intact;     language fluent;     normal attention, concentration,     fund of knowledge Cranial Nerves:    The pupils are equal, round, and reactive to light. The fundi are normal and spontaneous venous pulsations are present. Visual fields are full to finger confrontation. Extraocular movements are intact. Trigeminal sensation is intact and the muscles of mastication are normal. The face is symmetric. The palate elevates in the midline. Voice is normal. Shoulder shrug is normal. The tongue has normal motion without fasciculations.   Coordination:    Normal finger to nose and heel to shin.   Gait:    Good stride and turn. Difficulty with tandem  Motor Observation:    Postural tremor. Atrophy of the distal legs, tapering Tone:    Normal muscle tone.    Posture:    Posture is normal. normal erect    Strength:    Strength is V/V in the upper and lower limbs.      Sensation:   Decreased cold and pp to below knees and below ankles Absent vibaration at great toes, 10 seconds at medial  malleoli proprioception intact  Reflex Exam:  DTR's:    Absent achilles Toes:    The toes are downgoing bilaterally.   Clonus:    Clonus is absent.   Assessment/Plan:  58 year old male with a PMHx of long-standing continous alcohol abuse who is here for evaluation of peripheral neuropathy. Exam significant for absent acilles DTRs,  atrophy of the distal legs and tapering, decreased cold and pp to below knees and below ankles, absent vibaration at great toes. 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 Will need to evaluate for CMT given exam findings of tapering legs. Discussed alcohol cessation in detail, wife is present and appears to have a good understanding of the sequelae of his alcohol abuse and appears to be trying to support him. Low dose neurontin tid for symptoms.  Naomie DeanAntonia Avyanna Spada, MD  [facility] [address] [place], [place] [id number]  Phone [phone] Fax [phone]

## 2014-03-09 LAB — IFE AND PE, SERUM
ALBUMIN/GLOB SERPL: 1.5 (ref 0.7–2.0)
ALPHA2 GLOB SERPL ELPH-MCNC: 0.7 g/dL (ref 0.4–1.2)
Albumin SerPl Elph-Mcnc: 4 g/dL (ref 3.2–5.6)
Alpha 1: 0.1 g/dL (ref 0.1–0.4)
B-Globulin SerPl Elph-Mcnc: 0.9 g/dL (ref 0.6–1.3)
GLOBULIN, TOTAL: 2.8 g/dL (ref 2.0–4.5)
Gamma Glob SerPl Elph-Mcnc: 1.1 g/dL (ref 0.5–1.6)
IGG (IMMUNOGLOBIN G), SERUM: 1033 mg/dL (ref 700–1600)
IGM (IMMUNOGLOBULIN M), SRM: 249 mg/dL — AB (ref 40–230)
IgA/Immunoglobulin A, Serum: 146 mg/dL (ref 91–414)
Total Protein: 6.8 g/dL (ref 6.0–8.5)

## 2014-03-09 LAB — HIV ANTIBODY (ROUTINE TESTING W REFLEX)
HIV 1/HIV 2 AB: NONREACTIVE
HIV 1/O/2 Abs-Index Value: <1 (ref <1.00)

## 2014-03-09 LAB — HEAVY METALS, BLOOD
Arsenic: 5 ug/L (ref 2–23)
Lead, Blood: 1 ug/dL (ref 0–19)
MERCURY: 1.6 ug/L (ref 0.0–14.9)

## 2014-03-09 LAB — RPR: RPR: NONREACTIVE

## 2014-03-09 LAB — TSH: TSH: 0.612 u[IU]/mL (ref 0.450–4.500)

## 2014-03-09 LAB — B12 AND FOLATE PANEL
Folate: 13.1 ng/mL (ref >3.0)
Vitamin B-12: 402 pg/mL (ref 211–946)

## 2014-03-09 LAB — ANA W/REFLEX: ANA: NEGATIVE

## 2014-03-09 LAB — METHYLMALONIC ACID, SERUM: METHYLMALONIC ACID: 271 nmol/L (ref 0–378)

## 2014-03-09 LAB — VITAMIN B1, WHOLE BLOOD: THIAMINE: 137 nmol/L (ref 66.5–200.0)

## 2014-03-15 ENCOUNTER — Telehealth: Payer: Self-pay | Admitting: *Deleted

## 2014-03-15 NOTE — Telephone Encounter (Signed)
Please let patient know that their labs were normal.   

## 2014-03-17 ENCOUNTER — Ambulatory Visit (INDEPENDENT_AMBULATORY_CARE_PROVIDER_SITE_OTHER): Payer: 59 | Admitting: Neurology

## 2014-03-17 ENCOUNTER — Encounter (INDEPENDENT_AMBULATORY_CARE_PROVIDER_SITE_OTHER): Payer: Self-pay

## 2014-03-17 DIAGNOSIS — Z0289 Encounter for other administrative examinations: Secondary | ICD-10-CM

## 2014-03-17 DIAGNOSIS — G609 Hereditary and idiopathic neuropathy, unspecified: Secondary | ICD-10-CM

## 2014-03-20 NOTE — Progress Notes (Signed)
  [facility]    Provider:  Dr Lucia GaskinsAhern Referring Provider: Carylon PerchesFagan, Roy, MD Primary Care Physician:  Carylon PerchesFAGAN,ROY, MD  History: 58 year old male with a PMHx of long-standing continous alcohol abuse who is here for evaluation of peripheral neuropathy. Exam significant for absent acilles DTRs, atrophy of the distal legs and tapering, decreased cold and pp to below knees and below ankles, absent vibaration at great toes. 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 Marland Kitchen. Discussed alcohol cessation in detail, wife is present and appears to have a good understanding of the sequelae of his alcohol abuse and appears to be trying to support him. On low dose neurontin tid for symptoms.   Summary: Nerve conduction studies were performed on the right upper and bilateral lower extremities.  The right Median and Ulnar motor nerves were normal with normal F wave latencies  The right Median, Ulnar and radial sensory nerves were within normal limits.  The right Peroneal motor nerve showed reduced amplitude (1.645mV, N>2)  The left Peroneal motor nerve showed reduced amplitude (0.2113mV, N>2)  The left Tibial motor nerve showed reduced amplitude (2.383mV, N>3)  The right Tibial motor nerve showed borderline-low amplitude (3.823mV,N>3) The bilateral Peroneal and Sural sensory nerves showed no response.  The left Peroneal F wave showed no response The right Peroneal F wave showed delayed latency (63ms, N<56) The right Tibial F wave showed delayed latency (61.484ms, N<58) The left Tibial F wave showed delayed latency (62ms, N<58) Bilateral H Waves showed no response   EMG needle study was performed on selected left lower extremity muscles. The Abductor Hallucis muscle showed markedly increased spontaneous activity, increased motor unit amplitude, increased motor unit duration, and diminished recruitment. The following muscles were normal: Vastus Medialis, Anterior  Tibialis, Medial Gastrocnemius, Extensor Hallucis Longus.  Assessment/Plan: There is electrophysiologic evidence of a symmetric length-dependent severe axonal sensorimotor polyneuropathy.     Naomie DeanAntonia Robbert Langlinais, MD  [facility]  [address]  [place], [place] [id number]  Phone [phone] Fax [phone]

## 2014-03-22 ENCOUNTER — Telehealth: Payer: Self-pay | Admitting: Neurology

## 2014-03-22 NOTE — Telephone Encounter (Signed)
Sure, I can leave a sample out of front for him. I can leave it at the front desk with his name on it. Would you let him know he can pick it up at his convenience? thanks

## 2014-03-22 NOTE — Telephone Encounter (Signed)
Patient would like to proceed with samples of Gabapentin as discussed at last OV.  Please call and advise.

## 2014-03-22 NOTE — Telephone Encounter (Signed)
I called the patient back.  York SpanielSaid he will stop by later this afternoon to get meds.

## 2014-03-22 NOTE — Telephone Encounter (Signed)
Last OV note says try low dose Gabapentin.  Rx was sent at that time.  I called the patient back.  York SpanielSaid he has only been taking the Rx twice daily instead of three times daily as prescribed.  He takes it in the early afternoon and at bedtime.  Says it has given him benefit, however, he feels like it wears off around 4am.  He is "afraid" to take med in the morning for risk of drowsiness.  He would like to know if he can try samples of long acting Gabapentin to see if that will work better.  Please advise.  Thank you.

## 2014-03-28 ENCOUNTER — Telehealth: Payer: Self-pay | Admitting: Neurology

## 2014-03-28 NOTE — Telephone Encounter (Signed)
Patient requesting a return call regarding samples for Long Activity Gabapentin.  Please call and advise.

## 2014-04-07 ENCOUNTER — Telehealth: Payer: Self-pay | Admitting: Neurology

## 2014-04-07 NOTE — Telephone Encounter (Signed)
I called back and spoke with the patient.  He said he is currently taking Gralise 900mg  nightly, and feels this dose is sufficient.  He does not wish to increase it any further.  Patient would like to know if we can prescribe this dose.  York SpanielSaid he is aware his insurance may not cover this med, but he would like to proceed with having Rx sent to see if they will pay for it.  Please advise.  Thank you.

## 2014-04-07 NOTE — Telephone Encounter (Signed)
Patient calling to request Gralise script [facility], please call and advise.

## 2014-04-08 ENCOUNTER — Other Ambulatory Visit: Payer: Self-pay | Admitting: Neurology

## 2014-04-08 ENCOUNTER — Telehealth: Payer: Self-pay | Admitting: Neurology

## 2014-04-08 DIAGNOSIS — G609 Hereditary and idiopathic neuropathy, unspecified: Secondary | ICD-10-CM

## 2014-04-08 MED ORDER — GABAPENTIN (ONCE-DAILY) 600 MG PO TABS: 900.0000 mg | ORAL_TABLET | Freq: Every day | ORAL | Status: DC

## 2014-04-08 NOTE — Telephone Encounter (Signed)
I called back.  Spoke with pharmacist.  He said the medication is covered on Ins, however, it will be $246 per 30 days.  He has called the patient and left a message asking if they still want this Rx.  They will call us back once they hear from the patient regarding if he wants to fill Rx or not.

## 2014-04-08 NOTE — Telephone Encounter (Signed)
I called the patient back.  Got no answer.  Left message. 

## 2014-04-08 NOTE — Telephone Encounter (Signed)
Mardelle MatteAndy Pharmacist with [facility] @ [phone], received e-script for Rx Gabapentin, Once-Daily, (GRALISE) 600 MG TABS and states medication will cost 246.00.  Please call and advise.

## 2014-04-08 NOTE — Telephone Encounter (Signed)
Thanks [name]. Would you let him know that I called it in? thanks

## 2014-05-11 DIAGNOSIS — Z0289 Encounter for other administrative examinations: Secondary | ICD-10-CM

## 2014-12-07 ENCOUNTER — Other Ambulatory Visit: Payer: Self-pay | Admitting: Neurology

## 2015-01-19 ENCOUNTER — Other Ambulatory Visit: Payer: Self-pay | Admitting: Neurology

## 2015-02-21 ENCOUNTER — Telehealth: Payer: Self-pay

## 2015-02-21 NOTE — Telephone Encounter (Signed)
Pt has an appointment on 03/09/15 @ 9:00 with EG

## 2015-02-21 NOTE — Telephone Encounter (Signed)
Pt was referred from Dr. [name] for colonoscopy. Hx of adenoma polyps. Last done 05/18/2007 by Dr. Jena Gauss. Needs OV.  LMOM to return call.

## 2015-03-09 ENCOUNTER — Ambulatory Visit (INDEPENDENT_AMBULATORY_CARE_PROVIDER_SITE_OTHER): Payer: Managed Care, Other (non HMO) | Admitting: Nurse Practitioner

## 2015-03-09 ENCOUNTER — Other Ambulatory Visit: Payer: Self-pay

## 2015-03-09 ENCOUNTER — Encounter: Payer: Self-pay | Admitting: Nurse Practitioner

## 2015-03-09 VITALS — BP 119/70 | HR 76 | Temp 97.0°F | Ht 75.0 in | Wt 197.0 lb

## 2015-03-09 DIAGNOSIS — Z86010 Personal history of colonic polyps: Secondary | ICD-10-CM | POA: Diagnosis not present

## 2015-03-09 MED ORDER — PEG 3350-KCL-NA BICARB-NACL 420 G PO SOLR
4000.0000 mL | Freq: Once | ORAL | Status: DC
Start: 2015-03-09 — End: 2015-12-11

## 2015-03-09 NOTE — Patient Instructions (Signed)
1. We will schedule your colonoscopy for you 2. Further recommendations to be based on the results of your colonoscopy 

## 2015-03-09 NOTE — Progress Notes (Signed)
Primary Care Physician:  Carylon Perches, MD Primary Gastroenterologist:  Dr. Jena Gauss  Chief Complaint  Patient presents with  . Follow-up    HPI:   59 year old male presents for scheduling of surveillance colonoscopy. PCP notes reviewed. Last colonoscopy 05/18/2007 which found 8 mm sessile polyp, internal hemorrhoids, single anal papilla. Surgical pathology found tubulovillous adenoma. Recommended 3 year repeat, was due in 2011 and was notified of such. However, he did not come for his recall colonoscopy for reasons that are unknown to me.  Today he denies abdominal pain, N/V, hematochezia, melena. Has diarrhea 1-2 times a week which is chronic for him and well controlled with OTC Imodium and, per him, is related to diet. Denies fever, unintentional weight loss. Denies chest pain, dyspnea, dizziness, lightheadedness, syncope, near syncope. Denies any other upper or lower GI symptoms.  Past Medical History  Diagnosis Date  . Seizures (HCC)   . Hypertension   . Anxiety   . Alcohol abuse     Past Surgical History  Procedure Laterality Date  . Throat surgery      polyps removed x2  . Colonoscopy  05/18/2007    [id number] hemorrhoids, a single anal papilla, otherwise normal/ Single polyp, as described above, removed     Current Outpatient Prescriptions  Medication Sig Dispense Refill  . aspirin EC 81 MG tablet Take 81 mg by mouth daily.    Marland Kitchen GRALISE 600 MG TABS TAKE ONE AND ONE-HALF TABLETS ONCE DAILY. MAY INCREASE TO TWO TABLETSIF NEEDED. 30 tablet 0  . magnesium oxide (MAG-OX) 400 MG tablet Take 400 mg by mouth at bedtime.    . metoprolol (LOPRESSOR) 50 MG tablet Take 50 mg by mouth 2 (two) times daily. For systolic blood pressure above 90    . omeprazole (PRILOSEC) 20 MG capsule Take 20 mg by mouth every morning.    . simvastatin (ZOCOR) 20 MG tablet Take 20 mg by mouth every evening.    . tamsulosin (FLOMAX) 0.4 MG CAPS capsule Take 0.4 mg by mouth daily.    Marland Kitchen gabapentin  (NEURONTIN) 300 MG capsule Take 1 capsule (300 mg total) by mouth 3 (three) times daily. (Patient not taking: Reported on 03/09/2015) 90 capsule 11  . hydrochlorothiazide (HYDRODIURIL) 25 MG tablet Take 25 mg by mouth daily.    Marland Kitchen losartan (COZAAR) 100 MG tablet Take 100 mg by mouth daily.    . ondansetron (ZOFRAN ODT) 8 MG disintegrating tablet Take 1 tablet (8 mg total) by mouth every 8 (eight) hours as needed for nausea or vomiting. (Patient not taking: Reported on 03/09/2015) 15 tablet 0   No current facility-administered medications for this visit.    Allergies as of 03/09/2015 - Review Complete 03/09/2015  Allergen Reaction Noted  . Bee venom Anaphylaxis 10/08/2011  . Penicillins  10/08/2011    Family History  Problem Relation Age of Onset  . Fibromyalgia Mother   . Dementia Mother   . Congestive Heart Failure Mother   . Congestive Heart Failure Father   . Heart Problems Father   . Hypertension Brother   . Hypertension Brother   . Neuropathy Neg Hx     Social History   Social History  . Marital Status: Divorced    Spouse Name: N/A  . Number of Children: 0  . Years of Education: college 1   Occupational History  . grocery     [employer]   Social History Main Topics  . Smoking status: Former Games developer  . Smokeless tobacco: Never  Used  . Alcohol Use: 18.0 oz/week    30 Shots of liquor per week     Comment: daily liquor; beer occasionally  . Drug Use: No  . Sexual Activity: Not on file   Other Topics Concern  . Not on file   Social History Narrative    Review of Systems: General: Negative for anorexia, weight loss, fever, chills, fatigue, weakness. Eyes: Negative for vision changes.  ENT: Negative for hoarseness, difficulty swallowing. CV: Negative for chest pain, angina, palpitations,peripheral edema.  Respiratory: Negative for dyspnea at rest, cough, sputum, wheezing.  GI: See history of present illness. Derm: Negative for rash or itching.  Endo:  Negative for unusual weight change.  Heme: Negative for bruising or bleeding. Allergy: Negative for rash or hives.    Physical Exam: BP 119/70 mmHg  Pulse 76  Temp(Src) 97 F (36.1 C) (Oral)  Ht 6\' 3"  (1.905 m)  Wt 197 lb (89.359 kg)  BMI 24.62 kg/m2 General:   Alert and oriented. Pleasant and cooperative. Well-nourished and well-developed.  Head:  Normocephalic and atraumatic. Eyes:  Without icterus, sclera clear and conjunctiva pink.  Ears:  Normal auditory acuity. Cardiovascular:  S1, S2 present without murmurs appreciated. Extremities without clubbing or edema. Respiratory:  Clear to auscultation bilaterally. No wheezes, rales, or rhonchi. No distress.  Gastrointestinal:  +BS, soft, non-tender and non-distended. No HSM noted. No guarding or rebound. No masses appreciated.  Rectal:  Deferred  Neurologic:  Alert and oriented x4;  grossly normal neurologically. Psych:  Alert and cooperative. Normal mood and affect. Heme/Lymph/Immune: No excessive bruising noted.    03/09/2015 9:29 AM

## 2015-03-10 DIAGNOSIS — Z86010 Personal history of colonic polyps: Secondary | ICD-10-CM | POA: Insufficient documentation

## 2015-03-10 NOTE — Assessment & Plan Note (Signed)
History of tubulovillous adenoma on colonoscopy in 2008 recommend 3 year surveillance, which was not done. Patient is now overdue for colonoscopy. Currently asymptomatic from a GI standpoint. No red flag/warning signs/symptoms. Will proceed with surveillance TCS on porpofol/MAC due to current ETOH use of 2 liquor drinks a day and occasionally beer, although previously drank larger amounts.  Proceed with TCS in the OR with propofol/MAC with Dr. Jena Gauss in near future: the risks, benefits, and alternatives have been discussed with the patient in detail. The patient states understanding and desires to proceed.  The patient is not on any anticoagulants, anxiolytics, antidepressants, or chronic pain medications. Admits daily ETOH as per above, denies drug use. Will provide for propofol/MAC due to these reasons.

## 2015-03-10 NOTE — Patient Instructions (Signed)
CHANDRA FEGER  03/10/2015     @   Your procedure is scheduled on 03/16/15.  Report to [facility] at 12:00 A.M.  Call this number if you have problems the morning of surgery:  [phone]   Remember:  Do not eat food or drink liquids after midnight.  Take these medicines the morning of surgery with A SIP OF WATER Metoprolol, Prilosec, Flomax   Do not wear jewelry, make-up or nail polish.  Do not wear lotions, powders, or perfumes.  You may wear deodorant.  Do not shave 48 hours prior to surgery.  Men may shave face and neck.  Do not bring valuables to the hospital.  [facility] is not responsible for any belongings or valuables.  Contacts, dentures or bridgework may not be worn into surgery.  Leave your suitcase in the car.  After surgery it may be brought to your room.  For patients admitted to the hospital, discharge time will be determined by your treatment team.  Patients discharged the day of surgery will not be allowed to drive home.    Please read over the following fact sheets that you were given. Anesthesia Post-op Instructions     PATIENT INSTRUCTIONS POST-ANESTHESIA  IMMEDIATELY FOLLOWING SURGERY:  Do not drive or operate machinery for the first twenty four hours after surgery.  Do not make any important decisions for twenty four hours after surgery or while taking narcotic pain medications or sedatives.  If you develop intractable nausea and vomiting or a severe headache please notify your doctor immediately.  FOLLOW-UP:  Please make an appointment with your surgeon as instructed. You do not need to follow up with anesthesia unless specifically instructed to do so.  WOUND CARE INSTRUCTIONS (if applicable):  Keep a dry clean dressing on the anesthesia/puncture wound site if there is drainage.  Once the wound has quit draining you may leave it open to air.  Generally you should leave the bandage intact for twenty four hours unless there is  drainage.  If the epidural site drains for more than 36-48 hours please call the anesthesia department.  QUESTIONS?:  Please feel free to call your physician or the hospital operator if you have any questions, and they will be happy to assist you.      Colonoscopy A colonoscopy is an exam to look at the entire large intestine (colon). This exam can help find problems such as tumors, polyps, inflammation, and areas of bleeding. The exam takes about 1 hour.  LET [facility] CARE PROVIDER KNOW ABOUT:   Any allergies you have.  All medicines you are taking, including vitamins, herbs, eye drops, creams, and over-the-counter medicines.  Previous problems you or members of your family have had with the use of anesthetics.  Any blood disorders you have.  Previous surgeries you have had.  Medical conditions you have. RISKS AND COMPLICATIONS  Generally, this is a safe procedure. However, as with any procedure, complications can occur. Possible complications include:  Bleeding.  Tearing or rupture of the colon wall.  Reaction to medicines given during the exam.  Infection (rare). BEFORE THE PROCEDURE   Ask your health care provider about changing or stopping your regular medicines.  You may be prescribed an oral bowel prep. This involves drinking a large amount of medicated liquid, starting the day before your procedure. The liquid will cause you to have multiple loose stools until your stool is almost clear or light green. This cleans out your colon in  preparation for the procedure.  Do not eat or drink anything else once you have started the bowel prep, unless your health care provider tells you it is safe to do so.  Arrange for someone to drive you home after the procedure. PROCEDURE   You will be given medicine to help you relax (sedative).  You will lie on your side with your knees bent.  A long, flexible tube with a light and camera on the end (colonoscope) will be inserted  through the rectum and into the colon. The camera sends video back to a computer screen as it moves through the colon. The colonoscope also releases carbon dioxide gas to inflate the colon. This helps your health care provider see the area better.  During the exam, your health care provider may take a small tissue sample (biopsy) to be examined under a microscope if any abnormalities are found.  The exam is finished when the entire colon has been viewed. AFTER THE PROCEDURE   Do not drive for 24 hours after the exam.  You may have a small amount of blood in your stool.  You may pass moderate amounts of gas and have mild abdominal cramping or bloating. This is caused by the gas used to inflate your colon during the exam.  Ask when your test results will be ready and how you will get your results. Make sure you get your test results.   This information is not intended to replace advice given to you by your health care provider. Make sure you discuss any questions you have with your health care provider.   Document Released: 05/17/2000 Document Revised: 03/10/2013 Document Reviewed: 01/25/2013 Elsevier Interactive Patient Education [employer].

## 2015-03-13 NOTE — Progress Notes (Signed)
cc'd to pcp 

## 2015-03-14 ENCOUNTER — Encounter (HOSPITAL_COMMUNITY)
Admission: RE | Admit: 2015-03-14 | Discharge: 2015-03-14 | Disposition: A | Discharge status: Home / Self Care | Payer: Managed Care, Other (non HMO) | Source: Ambulatory Visit | Attending: Internal Medicine | Admitting: Internal Medicine

## 2015-03-14 ENCOUNTER — Other Ambulatory Visit: Payer: Self-pay

## 2015-03-14 ENCOUNTER — Encounter (HOSPITAL_COMMUNITY): Payer: Self-pay

## 2015-03-14 DIAGNOSIS — F419 Anxiety disorder, unspecified: Secondary | ICD-10-CM | POA: Diagnosis not present

## 2015-03-14 DIAGNOSIS — Z79899 Other long term (current) drug therapy: Secondary | ICD-10-CM | POA: Diagnosis not present

## 2015-03-14 DIAGNOSIS — Z01812 Encounter for preprocedural laboratory examination: Secondary | ICD-10-CM | POA: Diagnosis not present

## 2015-03-14 DIAGNOSIS — G4089 Other seizures: Secondary | ICD-10-CM | POA: Diagnosis not present

## 2015-03-14 DIAGNOSIS — Z87891 Personal history of nicotine dependence: Secondary | ICD-10-CM | POA: Diagnosis not present

## 2015-03-14 DIAGNOSIS — I10 Essential (primary) hypertension: Secondary | ICD-10-CM | POA: Diagnosis not present

## 2015-03-14 DIAGNOSIS — Z01810 Encounter for preprocedural cardiovascular examination: Secondary | ICD-10-CM | POA: Diagnosis not present

## 2015-03-14 DIAGNOSIS — Z7982 Long term (current) use of aspirin: Secondary | ICD-10-CM | POA: Diagnosis not present

## 2015-03-14 DIAGNOSIS — Z1211 Encounter for screening for malignant neoplasm of colon: Secondary | ICD-10-CM | POA: Diagnosis present

## 2015-03-14 HISTORY — DX: Polyneuropathy, unspecified: G62.9

## 2015-03-14 HISTORY — DX: Anemia, unspecified: D64.9

## 2015-03-14 LAB — BASIC METABOLIC PANEL
ANION GAP: 7 (ref 5–15)
BUN: 27 mg/dL — AB (ref 6–20)
CO2: 28 mmol/L (ref 22–32)
Calcium: 8.7 mg/dL — ABNORMAL LOW (ref 8.9–10.3)
Chloride: 103 mmol/L (ref 101–111)
Creatinine, Ser: 1.4 mg/dL — ABNORMAL HIGH (ref 0.61–1.24)
GFR calc Af Amer: >60 mL/min (ref >60)
GFR, EST NON AFRICAN AMERICAN: 54 mL/min — AB (ref >60)
Glucose, Bld: 167 mg/dL — ABNORMAL HIGH (ref 65–99)
POTASSIUM: 4.7 mmol/L (ref 3.5–5.1)
Sodium: 138 mmol/L (ref 135–145)

## 2015-03-14 LAB — CBC WITH DIFFERENTIAL/PLATELET
BASOS ABS: 0 10*3/uL (ref 0.0–0.1)
Basophils Relative: 1 %
EOS ABS: 0.1 10*3/uL (ref 0.0–0.7)
Eosinophils Relative: 2 %
HCT: 38.4 % — ABNORMAL LOW (ref 39.0–52.0)
Hemoglobin: 12.6 g/dL — ABNORMAL LOW (ref 13.0–17.0)
Lymphocytes Relative: 29 %
Lymphs Abs: 0.9 10*3/uL (ref 0.7–4.0)
MCH: 32.7 pg (ref 26.0–34.0)
MCHC: 32.8 g/dL (ref 30.0–36.0)
MCV: 99.7 fL (ref 78.0–100.0)
Monocytes Absolute: 0.4 10*3/uL (ref 0.1–1.0)
Monocytes Relative: 12 %
Neutro Abs: 1.8 10*3/uL (ref 1.7–7.7)
Neutrophils Relative %: 58 %
PLATELETS: 176 10*3/uL (ref 150–400)
RBC: 3.85 MIL/uL — AB (ref 4.22–5.81)
RDW: 15.6 % — ABNORMAL HIGH (ref 11.5–15.5)
WBC: 3 10*3/uL — AB (ref 4.0–10.5)

## 2015-03-14 NOTE — Pre-Procedure Instructions (Signed)
Patient given information to sign up for my chart at home. 

## 2015-03-16 ENCOUNTER — Ambulatory Visit (HOSPITAL_COMMUNITY): Payer: Managed Care, Other (non HMO) | Admitting: Anesthesiology

## 2015-03-16 ENCOUNTER — Encounter (HOSPITAL_COMMUNITY)
Admission: RE | Disposition: A | Discharge status: Home / Self Care | Payer: Self-pay | Source: Ambulatory Visit | Attending: Internal Medicine

## 2015-03-16 ENCOUNTER — Ambulatory Visit (HOSPITAL_COMMUNITY)
Admission: RE | Admit: 2015-03-16 | Discharge: 2015-03-16 | Disposition: A | Discharge status: Home / Self Care | Payer: Managed Care, Other (non HMO) | Source: Ambulatory Visit | Attending: Internal Medicine | Admitting: Internal Medicine

## 2015-03-16 ENCOUNTER — Encounter (HOSPITAL_COMMUNITY): Payer: Self-pay | Admitting: Certified Registered Nurse Anesthetist

## 2015-03-16 DIAGNOSIS — Z01810 Encounter for preprocedural cardiovascular examination: Secondary | ICD-10-CM | POA: Insufficient documentation

## 2015-03-16 DIAGNOSIS — Z7982 Long term (current) use of aspirin: Secondary | ICD-10-CM | POA: Insufficient documentation

## 2015-03-16 DIAGNOSIS — F419 Anxiety disorder, unspecified: Secondary | ICD-10-CM | POA: Insufficient documentation

## 2015-03-16 DIAGNOSIS — I10 Essential (primary) hypertension: Secondary | ICD-10-CM | POA: Insufficient documentation

## 2015-03-16 DIAGNOSIS — Z79899 Other long term (current) drug therapy: Secondary | ICD-10-CM | POA: Insufficient documentation

## 2015-03-16 DIAGNOSIS — Z1211 Encounter for screening for malignant neoplasm of colon: Secondary | ICD-10-CM | POA: Diagnosis not present

## 2015-03-16 DIAGNOSIS — Z86010 Personal history of colonic polyps: Secondary | ICD-10-CM | POA: Diagnosis not present

## 2015-03-16 DIAGNOSIS — Z87891 Personal history of nicotine dependence: Secondary | ICD-10-CM | POA: Insufficient documentation

## 2015-03-16 DIAGNOSIS — G4089 Other seizures: Secondary | ICD-10-CM | POA: Insufficient documentation

## 2015-03-16 DIAGNOSIS — Z01812 Encounter for preprocedural laboratory examination: Secondary | ICD-10-CM | POA: Insufficient documentation

## 2015-03-16 HISTORY — PX: COLONOSCOPY WITH PROPOFOL: SHX5780

## 2015-03-16 SURGERY — COLONOSCOPY WITH PROPOFOL
Anesthesia: Monitor Anesthesia Care

## 2015-03-16 MED ORDER — MIDAZOLAM HCL 2 MG/2ML IJ SOLN
1.0000 mg | INTRAMUSCULAR | Status: DC | PRN
  Administered 2015-03-16 (×2): 2 mg via INTRAVENOUS
  Filled 2015-03-16 (×2): qty 2

## 2015-03-16 MED ORDER — PROPOFOL 500 MG/50ML IV EMUL
INTRAVENOUS | Status: DC | PRN
  Administered 2015-03-16: 100 ug/kg/min via INTRAVENOUS

## 2015-03-16 MED ORDER — PROPOFOL 10 MG/ML IV BOLUS
INTRAVENOUS | Status: AC
  Filled 2015-03-16: qty 20

## 2015-03-16 MED ORDER — LACTATED RINGERS IV SOLN
INTRAVENOUS | Status: DC
  Administered 2015-03-16 (×2): via INTRAVENOUS

## 2015-03-16 MED ORDER — PROPOFOL 10 MG/ML IV BOLUS
INTRAVENOUS | Status: DC | PRN
  Administered 2015-03-16: 100 mg via INTRAVENOUS

## 2015-03-16 MED ORDER — ONDANSETRON HCL 4 MG/2ML IJ SOLN
4.0000 mg | Freq: Once | INTRAMUSCULAR | Status: AC
  Administered 2015-03-16: 4 mg via INTRAVENOUS
  Filled 2015-03-16: qty 2

## 2015-03-16 MED ORDER — GLYCOPYRROLATE 0.2 MG/ML IJ SOLN
0.2000 mg | Freq: Once | INTRAMUSCULAR | Status: AC
  Administered 2015-03-16: 0.2 mg via INTRAVENOUS
  Filled 2015-03-16: qty 1

## 2015-03-16 MED ORDER — WATER FOR IRRIGATION, STERILE IR SOLN
Status: DC | PRN
  Administered 2015-03-16: 1000 mL

## 2015-03-16 MED ORDER — ONDANSETRON HCL 4 MG/2ML IJ SOLN: 4.0000 mg | Freq: Once | INTRAMUSCULAR | Status: DC | PRN

## 2015-03-16 MED ORDER — FENTANYL CITRATE (PF) 100 MCG/2ML IJ SOLN: 25.0000 ug | INTRAMUSCULAR | Status: DC | PRN

## 2015-03-16 MED ORDER — STERILE WATER FOR IRRIGATION IR SOLN
Status: DC | PRN
  Administered 2015-03-16: 14:00:00

## 2015-03-16 MED ORDER — FENTANYL CITRATE (PF) 100 MCG/2ML IJ SOLN
25.0000 ug | INTRAMUSCULAR | Status: AC
  Administered 2015-03-16 (×2): 25 ug via INTRAVENOUS
  Filled 2015-03-16: qty 2

## 2015-03-16 SURGICAL SUPPLY — 22 items
ELECT REM PT RETURN 9FT ADLT (ELECTROSURGICAL)
ELECTRODE REM PT RTRN 9FT ADLT (ELECTROSURGICAL) IMPLANT
FCP BXJMBJMB 240X2.8X (CUTTING FORCEPS)
FLOOR PAD 36X40 (MISCELLANEOUS)
FORCEPS BIOP RAD 4 LRG CAP 4 (CUTTING FORCEPS) IMPLANT
FORCEPS BIOP RJ4 240 W/NDL (CUTTING FORCEPS)
FORCEPS BXJMBJMB 240X2.8X (CUTTING FORCEPS) IMPLANT
FORMALIN 10 PREFIL 20ML (MISCELLANEOUS) IMPLANT
INJECTOR/SNARE I SNARE (MISCELLANEOUS) IMPLANT
KIT ENDO PROCEDURE PEN (KITS) ×2 IMPLANT
MANIFOLD NEPTUNE II (INSTRUMENTS) ×2 IMPLANT
NEEDLE SCLEROTHERAPY 25GX240 (NEEDLE) IMPLANT
PAD FLOOR 36X40 (MISCELLANEOUS) IMPLANT
PROBE APC STR FIRE (PROBE) IMPLANT
PROBE INJECTION GOLD (MISCELLANEOUS)
PROBE INJECTION GOLD 7FR (MISCELLANEOUS) IMPLANT
SNARE ROTATE MED OVAL 20MM (MISCELLANEOUS) IMPLANT
SNARE SHORT THROW 13M SML OVAL (MISCELLANEOUS) IMPLANT
SYR 50ML LL SCALE MARK (SYRINGE) ×2 IMPLANT
TRAP SPECIMEN MUCOUS 40CC (MISCELLANEOUS) IMPLANT
TUBING IRRIGATION ENDOGATOR (MISCELLANEOUS) ×2 IMPLANT
WATER STERILE IRR 1000ML POUR (IV SOLUTION) ×2 IMPLANT

## 2015-03-16 NOTE — Op Note (Signed)
[facility] [address] [place], [id number]   COLONOSCOPY PROCEDURE REPORT  PATIENT: Tony Patterson, Tony Patterson  MR#: #[id number] BIRTHDATE: Oct 29, 1955 , 59  yrs. old GENDER: male ENDOSCOPIST: R.  Roetta SessionsMichael Burrel Legrand, MD FACP [facility] REFERRED BY:Roy Ouida SillsFagan, M.D. PROCEDURE DATE:  03/16/2015 PROCEDURE:   Colonoscopy, surveillance INDICATIONS:   Surveillance examination; history of colonic adenoma.  MEDICATIONS: Deep sedation per Dr.  [facility] ASA CLASS:       Class II  CONSENT: The risks, benefits, alternatives and imponderables including but not limited to bleeding, perforation as well as the possibility of a missed lesion have been reviewed.  The potential for biopsy, lesion removal, etc. have also been discussed. Questions have been answered.  All parties agreeable.  Please see the history and physical in the medical record for more information.  DESCRIPTION OF PROCEDURE:   After the risks benefits and alternatives of the procedure were thoroughly explained, informed consent was obtained.  The digital rectal exam      The endoscope was introduced through the anus and advanced to the cecum, which was identified by both the appendix and ileocecal valve. No adverse events experienced.   The quality of the prep was adequate  The instrument was then slowly withdrawn as the colon was fully examined. Estimated blood loss is zero unless otherwise noted in this procedure report.      COLON FINDINGS: Normal-appearing rectal mucosa aside from internal hemorrhoids.  Normal-appearing colonic mucosa.  Retroflexion was performed. .  Withdrawal time=8 minutes 0 seconds.  The scope was withdrawn and the procedure completed. COMPLICATIONS: There were no immediate complications.  ENDOSCOPIC IMPRESSION: Normal colonoscopy  RECOMMENDATIONS: Repeat examination in 5 years.  eSigned:  R. Roetta SessionsMichael Erendida Wrenn, MD Jerrel IvoryFACP [facility] 03/16/2015 2:09 PM   cc:  CPT CODES: ICD CODES:  The  ICD and CPT codes recommended by this software are interpretations from the data that the clinical staff has captured with the software.  The verification of the translation of this report to the ICD and CPT codes and modifiers is the sole responsibility of the health care institution and practicing physician where this report was generated.  [employer] will not be held responsible for the validity of the ICD and CPT codes included on this report.  AMA assumes no liability for data contained or not contained herein. CPT is a Publishing rights managerregistered trademark of the [employer].

## 2015-03-16 NOTE — Transfer of Care (Signed)
Immediate Anesthesia Transfer of Care Note  Patient: Tony RutterRobert H Stemmler  Procedure(s) Performed: Procedure(s) with comments: COLONOSCOPY WITH PROPOFOL (N/A) - in cecum at 1344. withdrawal time  8min  Patient Location: PACU  Anesthesia Type:MAC  Level of Consciousness: awake, alert , patient cooperative and responds to stimulation  Airway & Oxygen Therapy: Patient Spontanous Breathing and Patient connected to face mask oxygen  Post-op Assessment: Report given to RN, Post -op Vital signs reviewed and stable and Patient moving all extremities X 4  Post vital signs: Reviewed and stable  Last Vitals:  Filed Vitals:   03/16/15 1325  BP: 135/83  Pulse:   Temp:   Resp: 23    Complications: No apparent anesthesia complications

## 2015-03-16 NOTE — Anesthesia Preprocedure Evaluation (Signed)
Anesthesia Evaluation  Patient identified by MRN, date of birth, ID band Patient awake    Reviewed: Allergy & Precautions, NPO status , Patient's Chart, lab work & pertinent test results, reviewed documented beta blocker date and time   Airway Mallampati: I  TM Distance: >3 FB     Dental  (+) Teeth Intact   Pulmonary former smoker,    breath sounds clear to auscultation       Cardiovascular hypertension, Pt. on medications and Pt. on home beta blockers  Rhythm:Regular Rate:Normal     Neuro/Psych Seizures -, Well Controlled,  PSYCHIATRIC DISORDERS Anxiety  Neuromuscular disease (peripheral neuropathy)    GI/Hepatic GERD  ,(+)     substance abuse  alcohol use,   Endo/Other    Renal/GU      Musculoskeletal   Abdominal   Peds  Hematology   Anesthesia Other Findings   Reproductive/Obstetrics                             Anesthesia Physical Anesthesia Plan  ASA: III  Anesthesia Plan: MAC   Post-op Pain Management:    Induction: Intravenous  Airway Management Planned: Simple Face Mask  Additional Equipment:   Intra-op Plan:   Post-operative Plan:   Informed Consent: I have reviewed the patients History and Physical, chart, labs and discussed the procedure including the risks, benefits and alternatives for the proposed anesthesia with the patient or authorized representative who has indicated his/her understanding and acceptance.     Plan Discussed with:   Anesthesia Plan Comments:         Anesthesia Quick Evaluation

## 2015-03-16 NOTE — Discharge Instructions (Signed)
Colonoscopy, Care After °Refer to this sheet in the next few weeks. These instructions provide you with information on caring for yourself after your procedure. Your health care provider may also give you more specific instructions. Your treatment has been planned according to current medical practices, but problems sometimes occur. Call your health care provider if you have any problems or questions after your procedure. °WHAT TO EXPECT AFTER THE PROCEDURE  °After your procedure, it is typical to have the following: °· A small amount of blood in your stool. °· Moderate amounts of gas and mild abdominal cramping or bloating. °HOME CARE INSTRUCTIONS °· Do not drive, operate machinery, or sign important documents for 24 hours. °· You may shower and resume your regular physical activities, but move at a slower pace for the first 24 hours. °· Take frequent rest periods for the first 24 hours. °· Walk around or put a warm pack on your abdomen to help reduce abdominal cramping and bloating. °· Drink enough fluids to keep your urine clear or pale yellow. °· You may resume your normal diet as instructed by your health care provider. Avoid heavy or fried foods that are hard to digest. °· Avoid drinking alcohol for 24 hours or as instructed by your health care provider. °· Only take over-the-counter or prescription medicines as directed by your health care provider. °· If a tissue sample (biopsy) was taken during your procedure: °¨ Do not take aspirin or blood thinners for 7 days, or as instructed by your health care provider. °¨ Do not drink alcohol for 7 days, or as instructed by your health care provider. °¨ Eat soft foods for the first 24 hours. °SEEK MEDICAL CARE IF: °You have persistent spotting of blood in your stool 2-3 days after the procedure. °SEEK IMMEDIATE MEDICAL CARE IF: °· You have more than a small spotting of blood in your stool. °· You pass large blood clots in your stool. °· Your abdomen is swollen  (distended). °· You have nausea or vomiting. °· You have a fever. °· You have increasing abdominal pain that is not relieved with medicine. °  °This information is not intended to replace advice given to you by your health care provider. Make sure you discuss any questions you have with your health care provider. °  °Document Released: 01/02/2004 Document Revised: 03/10/2013 Document Reviewed: 01/25/2013 °Elsevier Interactive Patient Education ©2016 Elsevier Inc. ° °

## 2015-03-16 NOTE — H&P (View-Only) (Signed)
Primary Care Physician:  Carylon Perches, MD Primary Gastroenterologist:  Dr. Jena Gauss  Chief Complaint  Patient presents with  . Follow-up    HPI:   59 year old male presents for scheduling of surveillance colonoscopy. PCP notes reviewed. Last colonoscopy 05/18/2007 which found 8 mm sessile polyp, internal hemorrhoids, single anal papilla. Surgical pathology found tubulovillous adenoma. Recommended 3 year repeat, was due in 2011 and was notified of such. However, he did not come for his recall colonoscopy for reasons that are unknown to me.  Today he denies abdominal pain, N/V, hematochezia, melena. Has diarrhea 1-2 times a week which is chronic for him and well controlled with OTC Imodium and, per him, is related to diet. Denies fever, unintentional weight loss. Denies chest pain, dyspnea, dizziness, lightheadedness, syncope, near syncope. Denies any other upper or lower GI symptoms.  Past Medical History  Diagnosis Date  . Seizures (HCC)   . Hypertension   . Anxiety   . Alcohol abuse     Past Surgical History  Procedure Laterality Date  . Throat surgery      polyps removed x2  . Colonoscopy  05/18/2007    ONG:EXBMWUXL hemorrhoids, a single anal papilla, otherwise normal/ Single polyp, as described above, removed     Current Outpatient Prescriptions  Medication Sig Dispense Refill  . aspirin EC 81 MG tablet Take 81 mg by mouth daily.    Marland Kitchen GRALISE 600 MG TABS TAKE ONE AND ONE-HALF TABLETS ONCE DAILY. MAY INCREASE TO TWO TABLETSIF NEEDED. 30 tablet 0  . magnesium oxide (MAG-OX) 400 MG tablet Take 400 mg by mouth at bedtime.    . metoprolol (LOPRESSOR) 50 MG tablet Take 50 mg by mouth 2 (two) times daily. For systolic blood pressure above 90    . omeprazole (PRILOSEC) 20 MG capsule Take 20 mg by mouth every morning.    . simvastatin (ZOCOR) 20 MG tablet Take 20 mg by mouth every evening.    . tamsulosin (FLOMAX) 0.4 MG CAPS capsule Take 0.4 mg by mouth daily.    Marland Kitchen gabapentin  (NEURONTIN) 300 MG capsule Take 1 capsule (300 mg total) by mouth 3 (three) times daily. (Patient not taking: Reported on 03/09/2015) 90 capsule 11  . hydrochlorothiazide (HYDRODIURIL) 25 MG tablet Take 25 mg by mouth daily.    Marland Kitchen losartan (COZAAR) 100 MG tablet Take 100 mg by mouth daily.    . ondansetron (ZOFRAN ODT) 8 MG disintegrating tablet Take 1 tablet (8 mg total) by mouth every 8 (eight) hours as needed for nausea or vomiting. (Patient not taking: Reported on 03/09/2015) 15 tablet 0   No current facility-administered medications for this visit.    Allergies as of 03/09/2015 - Review Complete 03/09/2015  Allergen Reaction Noted  . Bee venom Anaphylaxis 10/08/2011  . Penicillins  10/08/2011    Family History  Problem Relation Age of Onset  . Fibromyalgia Mother   . Dementia Mother   . Congestive Heart Failure Mother   . Congestive Heart Failure Father   . Heart Problems Father   . Hypertension Brother   . Hypertension Brother   . Neuropathy Neg Hx     Social History   Social History  . Marital Status: Divorced    Spouse Name: N/A  . Number of Children: 0  . Years of Education: college 1   Occupational History  . grocery     [employer]   Social History Main Topics  . Smoking status: Former Games developer  . Smokeless tobacco: Never  Used  . Alcohol Use: 18.0 oz/week    30 Shots of liquor per week     Comment: daily liquor; beer occasionally  . Drug Use: No  . Sexual Activity: Not on file   Other Topics Concern  . Not on file   Social History Narrative    Review of Systems: General: Negative for anorexia, weight loss, fever, chills, fatigue, weakness. Eyes: Negative for vision changes.  ENT: Negative for hoarseness, difficulty swallowing. CV: Negative for chest pain, angina, palpitations,peripheral edema.  Respiratory: Negative for dyspnea at rest, cough, sputum, wheezing.  GI: See history of present illness. Derm: Negative for rash or itching.  Endo:  Negative for unusual weight change.  Heme: Negative for bruising or bleeding. Allergy: Negative for rash or hives.    Physical Exam: BP 119/70 mmHg  Pulse 76  Temp(Src) 97 F (36.1 C) (Oral)  Ht 6\' 3"  (1.905 m)  Wt 197 lb (89.359 kg)  BMI 24.62 kg/m2 General:   Alert and oriented. Pleasant and cooperative. Well-nourished and well-developed.  Head:  Normocephalic and atraumatic. Eyes:  Without icterus, sclera clear and conjunctiva pink.  Ears:  Normal auditory acuity. Cardiovascular:  S1, S2 present without murmurs appreciated. Extremities without clubbing or edema. Respiratory:  Clear to auscultation bilaterally. No wheezes, rales, or rhonchi. No distress.  Gastrointestinal:  +BS, soft, non-tender and non-distended. No HSM noted. No guarding or rebound. No masses appreciated.  Rectal:  Deferred  Neurologic:  Alert and oriented x4;  grossly normal neurologically. Psych:  Alert and cooperative. Normal mood and affect. Heme/Lymph/Immune: No excessive bruising noted.    03/09/2015 9:29 AM

## 2015-03-16 NOTE — Anesthesia Procedure Notes (Signed)
Procedure Name: MAC Date/Time: 03/16/2015 1:28 PM Performed by: Shary DecampMOSES, Arieana Somoza Pre-anesthesia Checklist: Patient identified, Emergency Drugs available, Suction available, Timeout performed and Patient being monitored Patient Re-evaluated:Patient Re-evaluated prior to inductionOxygen Delivery Method: Non-rebreather mask

## 2015-03-16 NOTE — Anesthesia Postprocedure Evaluation (Signed)
  Anesthesia Post-op Note  Patient: Tony RutterRobert H Baade  Procedure(s) Performed: Procedure(s) with comments: COLONOSCOPY WITH PROPOFOL (N/A) - in cecum at 1344. withdrawal time  8min  Patient Location: PACU  Anesthesia Type:MAC  Level of Consciousness: awake, alert , oriented, patient cooperative and responds to stimulation  Airway and Oxygen Therapy: Patient Spontanous Breathing and Patient connected to face mask oxygen  Post-op Pain: none  Post-op Assessment: Post-op Vital signs reviewed, Patient's Cardiovascular Status Stable, Respiratory Function Stable, Patent Airway, No signs of Nausea or vomiting and Pain level controlled              Post-op Vital Signs: Reviewed and stable  Last Vitals:  Filed Vitals:   03/16/15 1325  BP: 135/83  Pulse:   Temp:   Resp: 23    Complications: No apparent anesthesia complications

## 2015-03-16 NOTE — Interval H&P Note (Signed)
History and Physical Interval Note:  03/16/2015 12:33 PM  Tony Patterson  has presented today for surgery, with the diagnosis of hx of colon polyps  The various methods of treatment have been discussed with the patient and family. After consideration of risks, benefits and other options for treatment, the patient has consented to  Procedure(s) with comments: COLONOSCOPY WITH PROPOFOL (N/A) - 1115  as a surgical intervention .  The patient's history has been reviewed, patient examined, no change in status, stable for surgery.  I have reviewed the patient's chart and labs.  Questions were answered to the patient's satisfaction.     Tony Patterson  No change. Surveillance colonoscopy per plan.  The risks, benefits, limitations, alternatives and imponderables have been reviewed with the patient. Questions have been answered. All parties are agreeable.

## 2015-03-17 ENCOUNTER — Encounter (HOSPITAL_COMMUNITY): Payer: Self-pay | Admitting: Internal Medicine

## 2015-03-28 ENCOUNTER — Other Ambulatory Visit (HOSPITAL_COMMUNITY): Payer: Managed Care, Other (non HMO)

## 2015-03-30 ENCOUNTER — Ambulatory Visit: Admit: 2015-03-30 | Payer: Self-pay | Admitting: Internal Medicine

## 2015-03-30 SURGERY — COLONOSCOPY WITH PROPOFOL
Anesthesia: Monitor Anesthesia Care

## 2015-08-03 ENCOUNTER — Telehealth: Payer: Self-pay | Admitting: *Deleted

## 2015-08-03 NOTE — Telephone Encounter (Signed)
LVM for pt to call to set up f/u appt. Gave [facility] phone number and hours.   Called home number. Spoke to pt. He is going to call back to make appt. He has to check his work schedule first and then call back to make f/u.  I offered appt on 3/15 at 9am. He declined and is going to call back.

## 2015-08-16 ENCOUNTER — Telehealth: Payer: Self-pay | Admitting: Neurology

## 2015-08-16 NOTE — Telephone Encounter (Signed)
Pt called to schedule appt. I offered 1st available 3/29 but he will not be in town. York SpanielSaid he is off on 3/23 and is wanting an appt that day. He said it is hard for him to get off work.

## 2015-08-16 NOTE — Telephone Encounter (Signed)
Called pt back. Made f/u for 3/23 at 8am. Pt knows to check in 745am.

## 2015-08-24 ENCOUNTER — Encounter: Payer: Self-pay | Admitting: Neurology

## 2015-08-24 ENCOUNTER — Ambulatory Visit (INDEPENDENT_AMBULATORY_CARE_PROVIDER_SITE_OTHER): Payer: Managed Care, Other (non HMO) | Admitting: Neurology

## 2015-08-24 VITALS — BP 174/104 | HR 80 | Ht 75.0 in | Wt 200.4 lb

## 2015-08-24 DIAGNOSIS — R7302 Impaired glucose tolerance (oral): Secondary | ICD-10-CM | POA: Diagnosis not present

## 2015-08-24 DIAGNOSIS — G621 Alcoholic polyneuropathy: Secondary | ICD-10-CM

## 2015-08-24 NOTE — Progress Notes (Signed)
[facility]    Provider:  Dr Lucia Gaskins Referring Provider: Carylon Perches, MD Primary Care Physician:  Carylon Perches, MD  Provider: Dr Lucia Gaskins Referring Provider: Carylon Perches, MD Primary Care Physician: Carylon Perches, MD  CC: Numbness  Interval history 08/24/2015: His feet are mostly numb, they don't really hurt. He has checked circulation. His gralise is at . He increased about 2 months ago. The Gralise is stil working for him. He sleeps well. No significant pain in the feet. He continuous alcohol. He drinks bourbon and water at night. He goes through a half gallon of bourbon a week as well as beer. Discussed alcohol as a nerve toxin, patient acknowledges and understands the sequelae of alcoholism as far as his neuropathy is concerned.  HPI: Tony Patterson is a 60 y.o. male here as a referral from Dr. [name] for peripheral neuropathy.  PMHx of long-standing continous alcohol abuse.   Feet have been numb for a year. Progressing. Also tingling. Not burning. Mainly in the toes and balls of feet. Worse when working or on feet. Has LBP and radicular symptoms. And cramping in the toes at night. Balance is worsening. Hasn't taken any medication to try and help with the symptoms. Getting off his feet makes the pain better. Severe and symptoms can get up to 8/10 in pain. No Fhx of neuromuscular disorders or CMT or neuropathy. Denies ever taking medications for chemotherapy, no medications for gout, no isoniazid, flagyl or any new medications at onset. No inciting factors. He has 3 drinks every weekday of bourbon and a pint daily on the weekends. He is falling, tripping. He rolls his ankles. Had a seizure one year ago and was taken to [facility], may have been alcohol related. No episodes for a year since taken to [facility] per patient.   Reviewed notes, labs and imaging from outside physicians, which showed: PMHx of CKD, Depression, HLD, HTN and alcohol abuse with continuous drinking  behavior. Dr. [name] notes continuous alcholic consumption with elevated liver enzymes, drinks bourbon, HTN and HLD well controlled on metoprolol and statin. Last CBC and CMP significant for ALT 58 and AST 148 with a GFR of 81. Personally reviewed CT of the head from 11/2011, no large ischemic events, masses or tumors some questionably increased atrophy at the cerebellar vermis more than stated age.   EMG/NCS 03/2014: There is electrophysiologic evidence of a symmetric length-dependent severe axonal sensorimotor polyneuropathy.   Labs: TSH, HIV, ANA, IFE, RPR, heavy metals, B12, MMA, B1, all unremarkable in 2015   Review of Systems: Patient complains of symptoms per HPI as well as the following symptoms numbness, seizures, cramps, not enough sleep. Pertinent negatives per HPI. All others negative.  Social History   Social History  . Marital Status: Divorced    Spouse Name: N/A  . Number of Children: 0  . Years of Education: college 1   Occupational History  . grocery     [employer]   Social History Main Topics  . Smoking status: Former Smoker -- 0.50 packs/day for 20 years    Types: Cigarettes    Quit date: 06/03/1995  . Smokeless tobacco: Never Used  . Alcohol Use: 18.0 oz/week    30 Shots of liquor per week     Comment: daily liquor (about 2 drinks most evenings); beer occasionally; Previously drank more.  . Drug Use: No  . Sexual Activity: No   Other Topics Concern  . Not on file   Social History Narrative  Caffeine use: Coke (caffeine free) ocass    Family History  Problem Relation Age of Onset  . Fibromyalgia Mother   . Dementia Mother   . Congestive Heart Failure Mother   . Congestive Heart Failure Father   . Heart Problems Father   . Hypertension Brother   . Hypertension Brother   . Neuropathy Neg Hx   . Colon cancer Neg Hx     Past Medical History  Diagnosis Date  . Hypertension   . Anxiety   . Alcohol abuse   . Neuropathy (HCC)   . Seizures  (HCC)     unknown etiology and on no meds; been 4 years since seizure.  . Anemia     Past Surgical History  Procedure Laterality Date  . Throat surgery      polyps removed x2  . Colonoscopy  05/18/2007    ZOX:WRUEAVWU hemorrhoids, a single anal papilla, otherwise normal/ Single polyp, as described above, removed   . Colonoscopy with propofol N/A 03/16/2015    Procedure: COLONOSCOPY WITH PROPOFOL;  Surgeon: Corbin Ade, MD;  Location: [facility];  Service: Endoscopy;  Laterality: N/A;  in cecum at 1344. withdrawal time     Current Outpatient Prescriptions  Medication Sig Dispense Refill  . aspirin EC 81 MG tablet Take 81 mg by mouth daily.    Marland Kitchen GRALISE 600 MG TABS TAKE ONE AND ONE-HALF TABLETS ONCE DAILY. MAY INCREASE TO TWO TABLETSIF NEEDED. 30 tablet 0  . magnesium oxide (MAG-OX) 400 MG tablet Take 400 mg by mouth at bedtime.    . metoprolol (LOPRESSOR) 50 MG tablet Take 50 mg by mouth 2 (two) times daily. For systolic blood pressure above 90    . omeprazole (PRILOSEC) 20 MG capsule Take 20 mg by mouth every morning.    . polyethylene glycol-electrolytes (NULYTELY/GOLYTELY) 420 G solution Take 4,000 mLs by mouth once. 4000 mL 0  . simvastatin (ZOCOR) 20 MG tablet Take 20 mg by mouth every evening.    . tamsulosin (FLOMAX) 0.4 MG CAPS capsule Take 0.4 mg by mouth daily.     No current facility-administered medications for this visit.    Allergies as of 08/24/2015 - Review Complete 08/24/2015  Allergen Reaction Noted  . Bee venom Anaphylaxis 10/08/2011  . Penicillin g  08/24/2015  . Penicillins  10/08/2011    Vitals: BP 174/104 mmHg  Pulse 80  Ht  (1.905 m)  Wt 200 lb 6.4 oz (90.901 kg)  BMI 25.05 kg/m2 Last Weight:  Wt Readings from Last 1 Encounters:  08/24/15 200 lb 6.4 oz (90.901 kg)   Last Height:   Ht Readings from Last 1 Encounters:  08/24/15  (1.905 m)    Physical exam: Exam: Gen: NAD, conversant, well nourised, overweigh   Eyes: Conjunctivae clear without exudates or hemorrhage  Neuro: Detailed Neurologic Exam  Speech:  Speech is normal; fluent and spontaneous with normal comprehension.  Cognition:  The patient is oriented to person, place, and time;   Cranial Nerves:  The pupils are equal, round, and reactive to light.  Extraocular movements are intact. Trigeminal sensation is intact and the muscles of mastication are normal. The face is symmetric. The palate elevates in the midline. Voice is normal. Shoulder shrug is normal. The tongue has normal motion without fasciculations.   Gait:  Good stride and turn. Difficulty with tandem  Motor Observation:  Postural tremor. Atrophy of the distal legs, tapering Tone:  Normal muscle tone.   Posture:  Posture  is normal. normal erect   Strength:  Strength is V/V in the upper and lower limbs.    Sensation:   Decreased cold and pp to below knees and below elbows Absent vibaration at great toes, 8 seconds at medial malleoli proprioception intact  DTRs: Absent AJs   Assessment/Plan: 60 year old male with a PMHx of long-standing continous alcohol abuse who is here for follow up of stable peripheral neuropathy. Exam significant for absent acilles DTRs, atrophy of the distal legs and tapering, decreased cold and pp to below knees and below ankles, absent vibaration at great toes. 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 EMG/NCS with severe axonal polyneuropathy. Discussed alcohol cessation in detail, doing well on Gralise continue. Will check labs today including B vitamins which can be deficient in alcoholism.    Naomie DeanAntonia Ahern, MD  [facility] [address] [place], [place] [id number]  Phone [phone] Fax [phone]  A total of 30 minutes was spent face-to-face with this patient. Over half this time was spent on counseling  patient on the peripheral neuropathy diagnosis and different diagnostic and therapeutic options available.

## 2015-08-24 NOTE — Patient Instructions (Addendum)
Remember to drink plenty of fluid, eat healthy meals and do not skip any meals. Try to eat protein with a every meal and eat a healthy snack such as fruit or nuts in between meals. Try to keep a regular sleep-wake schedule and try to exercise daily, particularly in the form of walking, 20-30 minutes a day, if you can.   As far as your medications are concerned, I would like to suggest; Gralise 1200mg  at night  As far as diagnostic testing: labs today  I would like to see you back in 6 months, sooner if we need to. Please call us with any interim questions, concerns, problems, updates or refill requests.   Please also call us for any test results so we can go over those with you on the phone.  My clinical assistant and will answer any of your questions and relay your messages to me and also relay most of my messages to you.   Our phone number is [phone]. We also have an after hours call service for urgent matters and there is a physician on-call for urgent questions. For any emergencies you know to call 911 or go to the nearest emergency room

## 2015-08-28 LAB — MULTIPLE MYELOMA PANEL, SERUM
ALBUMIN/GLOB SERPL: 1.3 (ref 0.7–1.7)
Albumin SerPl Elph-Mcnc: 3.6 g/dL (ref 2.9–4.4)
Alpha 1: 0.1 g/dL (ref 0.0–0.4)
Alpha2 Glob SerPl Elph-Mcnc: 0.8 g/dL (ref 0.4–1.0)
B-Globulin SerPl Elph-Mcnc: 1 g/dL (ref 0.7–1.3)
Gamma Glob SerPl Elph-Mcnc: 1 g/dL (ref 0.4–1.8)
Globulin, Total: 3 g/dL (ref 2.2–3.9)
IGA/IMMUNOGLOBULIN A, SERUM: 145 mg/dL (ref 90–386)
IGM (IMMUNOGLOBULIN M), SRM: 217 mg/dL — AB (ref 20–172)
IgG (Immunoglobin G), Serum: 935 mg/dL (ref 700–1600)
Total Protein: 6.6 g/dL (ref 6.0–8.5)

## 2015-08-28 LAB — BASIC METABOLIC PANEL WITH GFR
BUN/Creatinine Ratio: 12 (ref 9–20)
BUN: 16 mg/dL (ref 6–24)
CO2: 26 mmol/L (ref 18–29)
Calcium: 8.2 mg/dL — ABNORMAL LOW (ref 8.7–10.2)
Chloride: 98 mmol/L (ref 96–106)
Creatinine, Ser: 1.32 mg/dL — ABNORMAL HIGH (ref 0.76–1.27)
GFR calc Af Amer: 68 mL/min/{1.73_m2}
GFR calc non Af Amer: 59 mL/min/{1.73_m2} — ABNORMAL LOW
Glucose: 90 mg/dL (ref 65–99)
Potassium: 4.4 mmol/L (ref 3.5–5.2)
Sodium: 141 mmol/L (ref 134–144)

## 2015-08-28 LAB — B12 AND FOLATE PANEL
FOLATE: 9.2 ng/mL (ref >3.0)
VITAMIN B 12: 283 pg/mL (ref 211–946)

## 2015-08-28 LAB — VITAMIN B1: THIAMINE: 165.1 nmol/L (ref 66.5–200.0)

## 2015-08-28 LAB — VITAMIN B6: Vitamin B6: 8.2 ug/L (ref 5.3–46.7)

## 2015-08-28 LAB — HEPATITIS C ANTIBODY: HCV AB: 0.1 {s_co_ratio} (ref 0.0–0.9)

## 2015-08-28 LAB — HEMOGLOBIN A1C
Est. average glucose Bld gHb Est-mCnc: 114 mg/dL
HEMOGLOBIN A1C: 5.6 % (ref 4.8–5.6)

## 2015-08-28 LAB — TSH: TSH: 0.629 u[IU]/mL (ref 0.450–4.500)

## 2015-08-28 LAB — FERRITIN: FERRITIN: 219 ng/mL (ref 30–400)

## 2015-08-29 ENCOUNTER — Telehealth: Payer: Self-pay | Admitting: *Deleted

## 2015-08-29 NOTE — Telephone Encounter (Signed)
Called and spoke to pt about lab results per Dr Lucia GaskinsAhern note. He verbalized understanding.  Added medication to pt med list.

## 2015-08-29 NOTE — Telephone Encounter (Signed)
-----   Message from Anson FretAntonia B Ahern, MD sent at 08/28/2015  6:04 PM EDT ----- Labs unremarkable. B12 was technically normal but in the low range. I do recommend he take vitamin B12 daily in a multivitamin or a b complex up to 1000-206500mcg daily. He has some chronic renal insufficiency but his creatinine is stable. Ferritin (iron stores) look good. He is not diabetic. Thyroid normal. Everything else looks good.

## 2015-12-11 ENCOUNTER — Ambulatory Visit (INDEPENDENT_AMBULATORY_CARE_PROVIDER_SITE_OTHER): Payer: Managed Care, Other (non HMO)

## 2015-12-11 ENCOUNTER — Ambulatory Visit (INDEPENDENT_AMBULATORY_CARE_PROVIDER_SITE_OTHER): Payer: Managed Care, Other (non HMO) | Admitting: Urgent Care

## 2015-12-11 VITALS — BP 161/99 | HR 130 | Temp 98.2°F | Resp 18 | Ht 75.0 in | Wt 201.2 lb

## 2015-12-11 DIAGNOSIS — R030 Elevated blood-pressure reading, without diagnosis of hypertension: Secondary | ICD-10-CM

## 2015-12-11 DIAGNOSIS — R0781 Pleurodynia: Secondary | ICD-10-CM

## 2015-12-11 DIAGNOSIS — S298XXA Other specified injuries of thorax, initial encounter: Secondary | ICD-10-CM

## 2015-12-11 DIAGNOSIS — S20212A Contusion of left front wall of thorax, initial encounter: Secondary | ICD-10-CM

## 2015-12-11 DIAGNOSIS — W19XXXA Unspecified fall, initial encounter: Secondary | ICD-10-CM

## 2015-12-11 DIAGNOSIS — I10 Essential (primary) hypertension: Secondary | ICD-10-CM | POA: Diagnosis not present

## 2015-12-11 DIAGNOSIS — S0081XA Abrasion of other part of head, initial encounter: Secondary | ICD-10-CM

## 2015-12-11 DIAGNOSIS — R0789 Other chest pain: Secondary | ICD-10-CM

## 2015-12-11 MED ORDER — METHOCARBAMOL 500 MG PO TABS: 500.0000 mg | ORAL_TABLET | Freq: Four times a day (QID) | ORAL | Status: DC | PRN

## 2015-12-11 NOTE — Progress Notes (Signed)
MRN: [id number] DOB: 11/05/1955  Subjective:   Tony Patterson is a 60 y.o. male presenting for chief complaint of Rib Injury  Reports 4 day history of fall while in the bathroom. He made direct impact with left side of his ribs against marble counter, also hit left side of head on the way down, made impact with commode. Has had dizziness, not new in onset but worse today. Also has some back pain. Patient did not lose consciousness. Denies headache, changes in vision, shob, n/v, confusion, swelling, weakness. Has longstanding neuropathy, history of seizures. This has not changed in nature.   Tony Patterson has a current medication list which includes the following prescription(s): atorvastatin calcium, cilostazol, gralise, magnesium oxide, metoprolol, omeprazole, tamsulosin, UNABLE TO FIND, vitamin b-12, polyethylene glycol-electrolytes, and simvastatin. Also is allergic to bee venom and penicillins.  Tony Patterson  has a past medical history of Hypertension; Anxiety; Alcohol abuse; Neuropathy (HCC); Seizures (HCC); and Anemia. Also  has past surgical history that includes Throat surgery; Colonoscopy (05/18/2007); and Colonoscopy with propofol (N/A, 03/16/2015).  His family history includes Congestive Heart Failure in his father and mother; Dementia in his mother; Fibromyalgia in his mother; Heart Problems in his father; Hypertension in his brother and brother. There is no history of Neuropathy or Colon cancer.   Objective:   Vitals: BP 152/90 mmHg  Pulse 130  Temp(Src) 98.2 F (36.8 C) (Oral)  Resp 18  Ht 6\' 3"  (1.905 m)  Wt 201 lb 3.2 oz (91.264 kg)  BMI 25.15 kg/m2  SpO2 99%  Physical Exam  Constitutional: He is oriented to person, place, and time. He appears well-developed and well-nourished.  HENT:  TM's intact bilaterally, no effusions or erythema. Nasal turbinates pink and moist, nasal passages patent. No sinus tenderness. Oropharynx clear, mucous membranes moist, dentition in good repair.   Eyes: EOM are normal. Pupils are equal, round, and reactive to light. Right eye exhibits no discharge. Left eye exhibits no discharge. No scleral icterus.  Neck: Normal range of motion. Neck supple.  Cardiovascular: Normal rate, regular rhythm and intact distal pulses.  Exam reveals no gallop and no friction rub.   No murmur heard. Pulmonary/Chest: No respiratory distress. He has no wheezes. He has no rales. He exhibits tenderness and bony tenderness (over area depicted). He exhibits no laceration, no edema, no swelling and no retraction.    Neurological: He is alert and oriented to person, place, and time. He has normal reflexes. No cranial nerve deficit.  Skin: Skin is warm and dry.   Dg Ribs Unilateral W/chest Left  12/11/2015  CLINICAL DATA:  60 year old male who fell on left side with rib pain. Initial encounter. EXAM: LEFT RIBS AND CHEST - 3+ VIEW COMPARISON:  08/29/2014. FINDINGS: Numerous chronic bilateral lateral rib fractures. Mildly lower lung volumes. Mediastinal contours remain normal. Visualized tracheal air column is within normal limits. No pneumothorax, pulmonary edema, pleural effusion or confluent pulmonary opacity. On the fourth image of the series a rib marker has been placed along the tenth/eleventh lateral ribs near the costochondral junctions. There are chronic fractures of the left lateral third through 5th ribs. No acute displaced rib fracture identified. There is an chronic right anterior seventh rib fracture which appears somewhat ununited. Other visualized osseous structures appear intact. Sclerotic foci projecting about the right humeral head appear to reflect joint space loose bodies. IMPRESSION: 1. Multiple bilateral chronic rib fractures. No acute displaced rib fracture identified. 2.  No acute cardiopulmonary abnormality. Electronically Signed   By:  Odessa Fleming M.D.   On: 12/11/2015 14:56     Assessment and Plan :   1. Rib contusion, left, initial encounter 2. Rib  pain on left side 3. Fall, initial encounter 4. Abrasion of face, initial encounter - Physical exam findings, radiology read are very reassuring. Anticipatory guidance provided. Patient will consider using Robaxin if needed. I declined using any stronger pain medication due to his history of alcoholism. He will use Tylenol, denies history of liver disease.  5. Elevated blood pressure reading 6. Essential hypertension - Reports that his blood pressure is well managed, I advised that he check back with his PCP for follow up. Patient agreed.  Wallis Bamberg, PA-C [facility] [phone] 12/11/2015 2:14 PM

## 2015-12-11 NOTE — Patient Instructions (Addendum)
Tylenol You may use 650mg  every 6 hours or 1,000mg  every 8 hours for pain and inflammation.     Rib Contusion A rib contusion is a deep bruise on your rib area. Contusions are the result of a blunt trauma that causes bleeding and injury to the tissues under the skin. A rib contusion may involve bruising of the ribs and of the skin and muscles in the area. The skin overlying the contusion may turn blue, purple, or yellow. Minor injuries will give you a painless contusion, but more severe contusions may stay painful and swollen for a few weeks. CAUSES  A contusion is usually caused by a blow, trauma, or direct force to an area of the body. This often occurs while playing contact sports. SYMPTOMS  Swelling and redness of the injured area.  Discoloration of the injured area.  Tenderness and soreness of the injured area.  Pain with or without movement. DIAGNOSIS  The diagnosis can be made by taking a medical history and performing a physical exam. An X-ray, CT scan, or MRI may be needed to determine if there were any associated injuries, such as broken bones (fractures) or internal injuries. TREATMENT  Often, the best treatment for a rib contusion is rest. Icing or applying cold compresses to the injured area may help reduce swelling and inflammation. Deep breathing exercises may be recommended to reduce the risk of partial lung collapse and pneumonia. Over-the-counter or prescription medicines may also be recommended for pain control. HOME CARE INSTRUCTIONS   Apply ice to the injured area:  Put ice in a plastic bag.  Place a towel between your skin and the bag.  Leave the ice on for 20 minutes, 2-3 times per day.  Take medicines only as directed by your health care provider.  Rest the injured area. Avoid strenuous activity and any activities or movements that cause pain. Be careful during activities and avoid bumping the injured area.  Perform deep-breathing exercises as directed by  your health care provider.  Do not lift anything that is heavier than 5 lb (2.3 kg) until your health care provider approves.  Do not use any tobacco products, including cigarettes, chewing tobacco, or electronic cigarettes. If you need help quitting, ask your health care provider. SEEK MEDICAL CARE IF:   You have increased bruising or swelling.  You have pain that is not controlled with treatment.  You have a fever. SEEK IMMEDIATE MEDICAL CARE IF:   You have difficulty breathing or shortness of breath.  You develop a continual cough, or you cough up thick or bloody sputum.  You feel sick to your stomach (nauseous), you throw up (vomit), or you have abdominal pain.   This information is not intended to replace advice given to you by your health care provider. Make sure you discuss any questions you have with your health care provider.   Document Released: 02/12/2001 Document Revised: 06/10/2014 Document Reviewed: 03/01/2014 Elsevier Interactive Patient Education 2016 [employer]     IF you received an x-ray today, you will receive an invoice from [facility]. Please contact [facility] at [phone] with questions or concerns regarding your invoice.   IF you received labwork today, you will receive an invoice from [facility]. Please contact [facility] at [phone] with questions or concerns regarding your invoice.   Our billing staff will not be able to assist you with questions regarding bills from these companies.  You will be contacted with the lab results as soon as  they are available. The fastest way to get your results is to activate your My Chart account. Instructions are located on the last page of this paperwork. If you have not heard from [place] regarding the results in 2 weeks, please contact this office.

## 2015-12-28 ENCOUNTER — Encounter (HOSPITAL_COMMUNITY): Payer: Self-pay

## 2015-12-28 ENCOUNTER — Emergency Department (HOSPITAL_COMMUNITY): Payer: Managed Care, Other (non HMO)

## 2015-12-28 ENCOUNTER — Emergency Department (HOSPITAL_COMMUNITY)
Admission: EM | Admit: 2015-12-28 | Discharge: 2015-12-28 | Disposition: A | Discharge status: Home / Self Care | Payer: Managed Care, Other (non HMO) | Attending: Emergency Medicine | Admitting: Emergency Medicine

## 2015-12-28 DIAGNOSIS — Y939 Activity, unspecified: Secondary | ICD-10-CM | POA: Insufficient documentation

## 2015-12-28 DIAGNOSIS — Y999 Unspecified external cause status: Secondary | ICD-10-CM | POA: Insufficient documentation

## 2015-12-28 DIAGNOSIS — S82831A Other fracture of upper and lower end of right fibula, initial encounter for closed fracture: Secondary | ICD-10-CM | POA: Diagnosis not present

## 2015-12-28 DIAGNOSIS — S82401A Unspecified fracture of shaft of right fibula, initial encounter for closed fracture: Secondary | ICD-10-CM

## 2015-12-28 DIAGNOSIS — Z79899 Other long term (current) drug therapy: Secondary | ICD-10-CM | POA: Insufficient documentation

## 2015-12-28 DIAGNOSIS — S99911A Unspecified injury of right ankle, initial encounter: Secondary | ICD-10-CM | POA: Diagnosis present

## 2015-12-28 DIAGNOSIS — Z87891 Personal history of nicotine dependence: Secondary | ICD-10-CM | POA: Diagnosis not present

## 2015-12-28 DIAGNOSIS — Y929 Unspecified place or not applicable: Secondary | ICD-10-CM | POA: Diagnosis not present

## 2015-12-28 DIAGNOSIS — I10 Essential (primary) hypertension: Secondary | ICD-10-CM | POA: Diagnosis not present

## 2015-12-28 DIAGNOSIS — W010XXA Fall on same level from slipping, tripping and stumbling without subsequent striking against object, initial encounter: Secondary | ICD-10-CM | POA: Diagnosis not present

## 2015-12-28 MED ORDER — LORAZEPAM 1 MG PO TABS
1.0000 mg | ORAL_TABLET | Freq: Once | ORAL | Status: AC
  Administered 2015-12-28: 1 mg via ORAL
  Filled 2015-12-28: qty 1

## 2015-12-28 MED ORDER — OXYCODONE HCL 5 MG PO TABS: 2.5000 mg | ORAL_TABLET | ORAL | 0 refills | Status: DC | PRN

## 2015-12-28 NOTE — ED Provider Notes (Addendum)
[facility]PT Provider Note   CSN: [id number] Arrival date & time: 12/28/15  1134  First Provider Contact:  First MD Initiated Contact with Patient 12/28/15 1150        History   Chief Complaint Chief Complaint  Patient presents with  . Ankle Pain    HPI Tony Patterson is a 60 y.o. male  With  a past medical history of Alcohol abuse; Anemia; Anxiety; Hypertension; alcoholic Neuropathy (HCC); and Seizures (HCC) who presents for ankle injury. Patient injured the R ankle 1 day(s) ago. Immediate symptoms: was able to bear weight directly after injury. Awoke this morning with severe pain.Symptoms have been worsening since that time. Prior history of related problems: no prior problems with this area in the past. There is pain and swelling at the lateral aspect of that ankle.      HPI  Past Medical History:  Diagnosis Date  . Alcohol abuse   . Anemia   . Anxiety   . Hypertension   . Neuropathy (HCC)   . Seizures (HCC)    unknown etiology and on no meds; been 4 years since seizure.    Patient Active Problem List   Diagnosis Date Noted  . History of adenomatous polyp of colon 03/10/2015  . Alcohol abuse, continuous 03/07/2014  . Hereditary and idiopathic peripheral neuropathy 03/07/2014    Past Surgical History:  Procedure Laterality Date  . COLONOSCOPY  05/18/2007   WUJ:[id number] hemorrhoids, a single anal papilla, otherwise normal/ Single polyp, as described above, removed   . COLONOSCOPY WITH PROPOFOL N/A 03/16/2015   Procedure: COLONOSCOPY WITH PROPOFOL;  Surgeon: Corbin Ade, MD;  Location: [facility];  Service: Endoscopy;  Laterality: N/A;  in cecum at 1344. withdrawal time   . THROAT SURGERY     polyps removed x2       Home Medications    Prior to Admission medications   Medication Sig Start Date End Date Taking? Authorizing Provider  Atorvastatin Calcium (LIPITOR PO) Take by mouth.    Historical Provider, MD  CILOSTAZOL PO Take by mouth.     Historical Provider, MD  GRALISE 600 MG TABS TAKE ONE AND ONE-HALF TABLETS ONCE DAILY. MAY INCREASE TO TWO TABLETSIF NEEDED. 01/19/15   Anson Fret, MD  magnesium oxide (MAG-OX) 400 MG tablet Take 400 mg by mouth at bedtime.    Historical Provider, MD  methocarbamol (ROBAXIN) 500 MG tablet Take 1 tablet (500 mg total) by mouth 4 (four) times daily as needed for muscle spasms. 12/11/15   Wallis Bamberg, PA-C  metoprolol (LOPRESSOR) 50 MG tablet Take 50 mg by mouth 2 (two) times daily. For systolic blood pressure above 90 10/09/11   Carylon Perches, MD  omeprazole (PRILOSEC) 20 MG capsule Take 20 mg by mouth every morning.    Historical Provider, MD  oxyCODONE (OXY IR/ROXICODONE) 5 MG immediate release tablet Take 0.5-1 tablets (2.5-5 mg total) by mouth every 4 (four) hours as needed for severe pain. 12/28/15   Arthor Captain, PA-C  tamsulosin (FLOMAX) 0.4 MG CAPS capsule Take 0.4 mg by mouth daily. 02/28/14   Historical Provider, MD  UNABLE TO FIND Med Name: Betahistine BID PRN    Historical Provider, MD  vitamin B-12 (CYANOCOBALAMIN) 1000 MCG tablet Take 1,000 mcg by mouth daily. 1000-2033mcg/day    Historical Provider, MD    Family History Family History  Problem Relation Age of Onset  . Fibromyalgia Mother   . Dementia Mother   . Congestive Heart Failure Mother   .  Congestive Heart Failure Father   . Heart Problems Father   . Hypertension Brother   . Hypertension Brother   . Neuropathy Neg Hx   . Colon cancer Neg Hx     Social History Social History  Substance Use Topics  . Smoking status: Former Smoker    Packs/day: 0.50    Years: 20.00    Types: Cigarettes    Quit date: 06/03/1995  . Smokeless tobacco: Never Used  . Alcohol use 18.0 oz/week    30 Shots of liquor per week     Comment: daily liquor (about 2 drinks most evenings); beer occasionally; Previously drank more.     Allergies   Bee venom and Penicillins   Review of Systems Review of Systems  Musculoskeletal: Positive for  gait problem and joint swelling.  Neurological: Positive for tremors.     Physical Exam Updated Vital Signs BP 150/77 (BP Location: Left Arm)   Pulse 110   Temp 99.4 F (37.4 C) (Oral)   Resp 16   Ht  (1.905 m)   Wt 90.7 kg   SpO2 99%   BMI 25.00 kg/m   Physical Exam  Constitutional: He is oriented to person, place, and time. He appears well-developed and well-nourished. No distress.  HENT:  Head: Normocephalic and atraumatic.  Eyes: Conjunctivae are normal. No scleral icterus.  Neck: Normal range of motion. Neck supple.  Cardiovascular: Normal rate, regular rhythm and normal heart sounds.   Pulmonary/Chest: Effort normal and breath sounds normal. No respiratory distress.  Abdominal: Soft. There is no tenderness.  Musculoskeletal: He exhibits edema and tenderness. He exhibits no deformity.  Right foot and ankle with moderate swelling, +bony tenderness over the lateral maleolus. Decreased ROM secondary to pain. NVI  Neurological: He is alert and oriented to person, place, and time.  tremulous  Skin: Skin is warm and dry. He is not diaphoretic.  Psychiatric: His behavior is normal.  Nursing note and vitals reviewed.    ED Treatments / Results  Labs (all labs ordered are listed, but only abnormal results are displayed) Labs Reviewed - No data to display  EKG  EKG Interpretation None       Radiology Dg Ankle Complete Right  Result Date: 12/28/2015 CLINICAL DATA:  Pain and swelling after fall EXAM: RIGHT ANKLE - COMPLETE 3+ VIEW COMPARISON:  None. FINDINGS: Frontal, oblique, and lateral views were obtained. There is generalized soft tissue swelling. There is a nondisplaced fracture of the distal fibular diaphysis. There is a tiny avulsion in the medial malleolar region. No other fractures are evident. No joint effusion. Ankle mortise appears intact. There is a small exostosis along the anterior inferior tibia. IMPRESSION: Nondisplaced fracture distal fibular  diaphysis. Tiny avulsion arising from the medial malleolus. Generalized soft tissue swelling. Ankle mortise appears intact. No appreciable joint effusion. Small exostosis along the anterior inferior tibia. Electronically Signed   By: Bretta Bang III M.D.   On: 12/28/2015 12:42   Procedures Procedures (including critical care time)  Medications Ordered in ED Medications  LORazepam (ATIVAN) tablet 1 mg (1 mg Oral Given 12/28/15 1315)     Initial Impression / Assessment and Plan / ED Course  I have reviewed the triage vital signs and the nursing notes.  Pertinent labs & imaging results that were available during my care of the patient were reviewed by me and considered in my medical decision making (see chart for details).  Clinical Course  Comment By Time  Patient with distal right  fibular fracture. The ankle mortise appears stable. Patient will be placed in a cam walker with crutches. Patient also has a history of chronic alcohol abuse and dependence. He is tremulous. I suspect that is due to withdrawal in between doses of alcohol. Patient given Ativan here in the emergency department. Discussed the case with Dr. Erma Heritage who agrees with plan of care. He ambulates well on crutches prior to discharge. Discussed follow-up and return precautions. He appears safe for discharge at this time Arthor Captain, PA-C 07/27 1324    I reviewed the patient's on the [place] controlled substances reporting system. He has no current controlled substances prescribed.  Final Clinical Impressions(s) / ED Diagnoses   Final diagnoses:  Closed right fibular fracture, initial encounter    New Prescriptions Discharge Medication List as of 12/28/2015  1:29 PM    START taking these medications   Details  oxyCODONE (OXY IR/ROXICODONE) 5 MG immediate release tablet Take 0.5-1 tablets (2.5-5 mg total) by mouth every 4 (four) hours as needed for severe pain., Starting Thu 12/28/2015, Print              [place], PA-C 12/28/15 1651    Shaune Pollack, MD 12/28/15 2042    Arthor Captain, PA-C 01/12/16 2025    Shaune Pollack, MD 01/13/16 1213

## 2015-12-28 NOTE — ED Triage Notes (Signed)
Reports of tripping over door frame last night. Complaining of right ankle pain.

## 2016-01-23 ENCOUNTER — Telehealth: Payer: Self-pay | Admitting: Neurology

## 2016-01-23 NOTE — Telephone Encounter (Signed)
Pt changed pharmacies and they are requesting a new rx stating   GRALISE 600 MG TABS    can be given out strictly generic. Please send to [facility] [place].

## 2016-01-23 NOTE — Telephone Encounter (Signed)
Dr Lucia GaskinsAhern- please advise. I updated pt pharmacy.  You last saw patient 08/24/2015. Last time it looks like Gralise was prescribed was 01/19/15 for 30 day supply.  Last office visit you recommended "Gralise 1200mg  at night".

## 2016-01-24 NOTE — Telephone Encounter (Signed)
Called pt home number. LVM to call. Called mobile. LVM for pt to call about refill for gralise. Gave [facility] phone number.

## 2016-01-24 NOTE — Telephone Encounter (Signed)
Dr Lucia GaskinsAhern- can you send in rx generic for gralise for 1800mg ? See message below.   Called and spoke to pt. He gave verbal ok to speak to nephew, Perlie GoldRussell. He stated [place] in [place] and they increased his dose to 1800mg . He takes 900mg  in the morning and 900mg  in the evening. They wanted to know if Dr Lucia GaskinsAhern is okay with calling in new rx with this dose.   I spoke to Dr Lucia GaskinsAhern while on the phone and Dr Lucia GaskinsAhern ok with this. I relayed message to Perlie GoldRussell and advised Dr Lucia GaskinsAhern will call in new rx for them. He verbalized understanding.

## 2016-01-24 NOTE — Telephone Encounter (Signed)
This goes [facility] can you fill out the form? Do you have a Gralise specialty pharmacy form? If not let me know and I can find one. thanks

## 2016-01-24 NOTE — Telephone Encounter (Signed)
Nephew Tony [name] [phone] called regarding GRALISE, requests that prescription for Generic be sent to [facility], Brand costs $450, Generic costs $25.

## 2016-01-24 NOTE — Telephone Encounter (Signed)
I am fine with a 10157-month refill refill, would you refill and see if patient wants to increase to 1200mg  a night or stay at 600mg ?

## 2016-01-25 ENCOUNTER — Other Ambulatory Visit: Payer: Self-pay | Admitting: Neurology

## 2016-01-25 DIAGNOSIS — M792 Neuralgia and neuritis, unspecified: Secondary | ICD-10-CM

## 2016-01-25 MED ORDER — GABAPENTIN (ONCE-DAILY) 300 MG PO TABS: 900.0000 mg | ORAL_TABLET | Freq: Two times a day (BID) | ORAL | 12 refills | Status: DC | PRN

## 2016-01-29 NOTE — Telephone Encounter (Signed)
Left message for a return call.  Need to clarify his refill request.

## 2016-01-29 NOTE — Telephone Encounter (Signed)
Patient's nephew is calling back stating he would like a new Rx for generic Gabapentin,600mg  [place] [facility] [place]. The patient takes 3 times a day. This Rx needs to be generic for insurance purposes.

## 2016-01-30 NOTE — Telephone Encounter (Addendum)
Still unable to reach. Left another message to return the call.

## 2016-01-30 NOTE — Telephone Encounter (Signed)
Attempted to reach patient again.  Left another message for a return call. 

## 2016-01-31 NOTE — Telephone Encounter (Signed)
Dr Lucia GaskinsAhern- can you send in rx gabapentin generic as requested? Thank you  Patient called office back. He stated gralise is too expensive for him and insurance will not cover this. He would like generic gabapentin 600mg  3x/day sent to [facility] [place], [place] - [address] Advised we will send this to his pharmacy.  He verbalized understanding.

## 2016-02-01 ENCOUNTER — Other Ambulatory Visit: Payer: Self-pay | Admitting: Neurology

## 2016-02-01 MED ORDER — GABAPENTIN 600 MG PO TABS: 600.0000 mg | ORAL_TABLET | Freq: Three times a day (TID) | ORAL | 12 refills | Status: DC

## 2016-02-01 NOTE — Telephone Encounter (Signed)
Dr Lucia Gaskinsahern- can you send in rx as requested? Gabapentin. See note below. Pt called requesting again, thank you

## 2016-02-01 NOTE — Telephone Encounter (Signed)
Pt called back to advise he will need the medication in the next day or two.

## 2016-02-22 ENCOUNTER — Ambulatory Visit (INDEPENDENT_AMBULATORY_CARE_PROVIDER_SITE_OTHER): Payer: Managed Care, Other (non HMO) | Admitting: Neurology

## 2016-02-22 ENCOUNTER — Encounter: Payer: Self-pay | Admitting: Neurology

## 2016-02-22 ENCOUNTER — Other Ambulatory Visit: Payer: Self-pay | Admitting: *Deleted

## 2016-02-22 ENCOUNTER — Ambulatory Visit
Admission: RE | Admit: 2016-02-22 | Discharge: 2016-02-22 | Disposition: A | Discharge status: Home / Self Care | Payer: Managed Care, Other (non HMO) | Source: Ambulatory Visit | Attending: Neurology | Admitting: Neurology

## 2016-02-22 ENCOUNTER — Other Ambulatory Visit: Payer: Self-pay | Admitting: Neurology

## 2016-02-22 VITALS — BP 109/71 | HR 78 | Ht 75.0 in | Wt 207.2 lb

## 2016-02-22 DIAGNOSIS — G621 Alcoholic polyneuropathy: Secondary | ICD-10-CM

## 2016-02-22 DIAGNOSIS — I82401 Acute embolism and thrombosis of unspecified deep veins of right lower extremity: Secondary | ICD-10-CM

## 2016-02-22 DIAGNOSIS — R2689 Other abnormalities of gait and mobility: Secondary | ICD-10-CM

## 2016-02-22 DIAGNOSIS — M25561 Pain in right knee: Secondary | ICD-10-CM

## 2016-02-22 DIAGNOSIS — R269 Unspecified abnormalities of gait and mobility: Secondary | ICD-10-CM | POA: Diagnosis not present

## 2016-02-22 DIAGNOSIS — S8991XA Unspecified injury of right lower leg, initial encounter: Secondary | ICD-10-CM | POA: Diagnosis not present

## 2016-02-22 DIAGNOSIS — W19XXXA Unspecified fall, initial encounter: Secondary | ICD-10-CM

## 2016-02-22 NOTE — Progress Notes (Signed)
[id number]    Provider:  Dr Lucia Gaskins Referring Provider: Carylon Perches, MD Primary Care Physician:  Carylon Perches, MD  CC: Numbness  Interval history 02/22/2016: Knee has been hurting for 8 weeks with swelling. A cortisone shot did not help. Xrays showed some arthritis. Patient says he fell and the right knee bent all the way back. Since then swelling and pain. He broke his ankle 7 weeks ago also after a fall he tripped over the threhold and also injured his knee. He decided to retire. He stopped drinking. He was given lorazepam for 3 days. Knee brace is helping. His feet are mostly numb. He does have some pain on the bottom of his feet. His big toes hurt a lot. The right leg is swollen below the knee and he has pain in his calf. [place] 15 hours straight without ambulating. Will refer to physical therapy for gait abnormality, u/s to eval for DVT and referral to [facility].  Interval history 08/24/2015: His feet are mostly numb, they don't really hurt. He has checked circulation. His gralise is at 1200mg . He increased about 2 months ago. The Gralise is stil working for him. He sleeps well. No significant pain in the feet. He continuous alcohol. He drinks bourbon and water at night. He goes through a half gallon of bourbon a week as well as beer. Discussed alcohol as a nerve toxin, patient acknowledges and understands the sequelae of alcoholism as far as his neuropathy is concerned.  HPI: Tony Patterson is a 60 y.o. male here as a referral from Dr. [name] for peripheral neuropathy.  PMHx of long-standing continous alcohol abuse.   Feet have been numb for a year. Progressing. Also tingling. Not burning. Mainly in the toes and balls of feet. Worse when working or on feet. Has LBP and radicular symptoms. And cramping in the toes at night. Balance is worsening. Hasn't taken any medication to try and help with the symptoms. Getting off his  feet makes the pain better. Severe and symptoms can get up to 8/10 in pain. No Fhx of neuromuscular disorders or CMT or neuropathy. Denies ever taking medications for chemotherapy, no medications for gout, no isoniazid, flagyl or any new medications at onset. No inciting factors. He has 3 drinks every weekday of bourbon and a pint daily on the weekends. He is falling, tripping. He rolls his ankles. Had a seizure one year ago and was taken to [facility], may have been alcohol related. No episodes for a year since taken to [facility] per patient.   Reviewed notes, labs and imaging from outside physicians, which showed: PMHx of CKD, Depression, HLD, HTN and alcohol abuse with continuous drinking behavior. Dr. [name] notes continuous alcholic consumption with elevated liver enzymes, drinks bourbon, HTN and HLD well controlled on metoprolol and statin. Last CBC and CMP significant for ALT 58 and AST 148 with a GFR of 81. Personally reviewed CT of the head from 11/2011, no large ischemic events, masses or tumors some questionably increased atrophy at the cerebellar vermis more than stated age.   EMG/NCS 03/2014: There is electrophysiologic evidence of a symmetric length-dependent severe axonal sensorimotor polyneuropathy.   Labs: TSH, HIV, ANA, IFE, RPR, heavy metals, B12, MMA, B1, all unremarkable in 2015    Social History   Social History  . Marital status: Divorced    Spouse name: N/A  . Number of children: 0  . Years of education: college  1   Occupational History  . [employer]     Grocery   Social History Main Topics  . Smoking status: Former Smoker    Packs/day: 0.50    Years: 20.00    Types: Cigarettes    Quit date: 06/03/1995  . Smokeless tobacco: Never Used  . Alcohol use 18.0 oz/week    30 Shots of liquor per week     Comment: daily liquor (about 2 drinks most evenings); beer occasionally; Previously drank more.  . Drug use: No  . Sexual activity: No     Other Topics Concern  . Not on file   Social History Narrative   Caffeine use: Coke (caffeine free) ocass    Family History  Problem Relation Age of Onset  . Fibromyalgia Mother   . Dementia Mother   . Congestive Heart Failure Mother   . Congestive Heart Failure Father   . Heart Problems Father   . Hypertension Brother   . Hypertension Brother   . Neuropathy Neg Hx   . Colon cancer Neg Hx     Past Medical History:  Diagnosis Date  . Alcohol abuse   . Anemia   . Anxiety   . Hypertension   . Neuropathy (HCC)   . Seizures (HCC)    unknown etiology and on no meds; been 4 years since seizure.    Past Surgical History:  Procedure Laterality Date  . COLONOSCOPY  05/18/2007   [id number] hemorrhoids, a single anal papilla, otherwise normal/ Single polyp, as described above, removed   . COLONOSCOPY WITH PROPOFOL N/A 03/16/2015   Procedure: COLONOSCOPY WITH PROPOFOL;  Surgeon: Corbin Ade, MD;  Location: [facility];  Service: Endoscopy;  Laterality: N/A;  in cecum at 1344. withdrawal time   . THROAT SURGERY     polyps removed x2    Current Outpatient Prescriptions  Medication Sig Dispense Refill  . Atorvastatin Calcium (LIPITOR PO) Take by mouth.    Marland Kitchen b complex vitamins capsule Take 1 capsule by mouth daily.    Marland Kitchen CILOSTAZOL PO Take by mouth.    . gabapentin (NEURONTIN) 600 MG tablet Take 1 tablet (600 mg total) by mouth 3 (three) times daily. 90 tablet 12  . Gabapentin, Once-Daily, 300 MG TABS Take 900 mg by mouth 2 (two) times daily as needed. 180 tablet 12  . magnesium oxide (MAG-OX) 400 MG tablet Take 400 mg by mouth at bedtime.    . metoprolol (LOPRESSOR) 50 MG tablet Take 50 mg by mouth 2 (two) times daily. For systolic blood pressure above 90    . omeprazole (PRILOSEC) 20 MG capsule Take 20 mg by mouth every morning.    . tamsulosin (FLOMAX) 0.4 MG CAPS capsule Take 0.4 mg by mouth daily.     No current facility-administered medications for this visit.      Allergies as of 02/22/2016 - Review Complete 12/28/2015  Allergen Reaction Noted  . Bee venom Anaphylaxis 10/08/2011  . Penicillins  10/08/2011    Vitals: BP 109/71 (BP Location: Right Arm, Patient Position: Sitting, Cuff Size: Normal)   Pulse 78   Ht 6\' 3"  (1.905 m)   Wt 207 lb 3.2 oz (94 kg)   BMI 25.90 kg/m  Last Weight:  Wt Readings from Last 1 Encounters:  02/22/16 207 lb 3.2 oz (94 kg)   Last Height:   Ht Readings from Last 1 Encounters:  02/22/16 6\' 3"  (1.905 m)        Physical exam: Exam:  Gen: NAD, conversant, well nourised, overweigh  Eyes: Conjunctivae clear without exudates or hemorrhage  Neuro: Detailed Neurologic Exam  Speech:  Speech is normal; fluent and spontaneous with normal comprehension.  Cognition:  The patient is oriented to person, place, and time;   Cranial Nerves:  The pupils are equal, round, and reactive to light.  Extraocular movements are intact. Trigeminal sensation is intact and the muscles of mastication are normal. The face is symmetric. The palate elevates in the midline. Voice is normal. Shoulder shrug is normal. The tongue has normal motion without fasciculations.   Gait:  Good stride and turn. Difficulty with tandem  Motor Observation:  Postural tremor. Atrophy of the distal legs, tapering Tone:  Normal muscle tone.   Posture:  Posture is normal. normal erect   Strength:  Strength is V/V in the upper and lower limbs.    Sensation:   Decreased cold and pp to below knees and below elbows Absent vibaration at great toes, 8 seconds at medial malleoli proprioception intact  DTRs: Absent AJs   Assessment/Plan: 60 year old male with a PMHx of long-standing continous alcohol abuse(recently stopped) who is here for follow up of stable peripheral neuropathy. Exam significant for absent acilles DTRs, atrophy of the distal legs and tapering, decreased cold and pp to  below knees and below ankles, absent vibaration at great toes. 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 EMG/NCS with severe axonal polyneuropathy. Discussed alcohol cessation in detail, doing well on Gralise continue. Will check labs today including B vitamins which can be deficient in alcoholism.  - PT gait and safety and balace for falls. Fall risk.  - US right LE for swelling and calf pain after a long flight - [facility] for knee pain - Decrease Gabapentin 2x a day for a week and then once a day just at night as he really has more numbness now and not pain.  - f/u 6 months   Naomie DeanAntonia Ahern, MD  [facility] [address] [place], [place] [id number]  Phone [phone] Fax [phone]  A total of 30 minutes was spent face-to-face with this patient. Over half this time was spent on counseling patient on the peripheral neuropathy, knee pain, leg swelling diagnosis and different diagnostic and therapeutic options available.

## 2016-02-22 NOTE — Patient Instructions (Addendum)
  Remember to drink plenty of fluid, eat healthy meals and do not skip any meals. Try to eat protein with a every meal and eat a healthy snack such as fruit or nuts in between meals. Try to keep a regular sleep-wake schedule and try to exercise daily, particularly in the form of walking, 20-30 minutes a day, if you can.   As far as your medications are concerned, I would like to suggest: Decrease gabapentin to 2x a day for one week and then just at bedtime. Physical therapy and Ultrasound of the right leg. Referral to [facility].   I would like to see you back in 6 month, sooner if we need to. Please call us with any interim questions, concerns, problems, updates or refill requests.    Our phone number is [phone]. We also have an after hours call service for urgent matters and there is a physician on-call for urgent questions. For any emergencies you know to call 911 or go to the nearest emergency room

## 2016-02-26 ENCOUNTER — Telehealth: Payer: Self-pay | Admitting: *Deleted

## 2016-02-26 NOTE — Telephone Encounter (Signed)
Called and spoke to pt. Relayed US showed no DVT's. (Blood clots). He verbalized understanding. I advised if he does not hear about scheduling PT within next week or two, to call me. I want to ensure he gets scheduled.

## 2016-02-26 NOTE — Telephone Encounter (Signed)
-----   Message from Anson FretAntonia B Ahern, MD sent at 02/22/2016  6:17 PM EDT ----- No dvt thanks

## 2016-03-06 ENCOUNTER — Ambulatory Visit: Payer: Managed Care, Other (non HMO) | Admitting: Physical Therapy

## 2016-03-06 DIAGNOSIS — M6281 Muscle weakness (generalized): Secondary | ICD-10-CM | POA: Insufficient documentation

## 2016-03-06 DIAGNOSIS — R2689 Other abnormalities of gait and mobility: Secondary | ICD-10-CM | POA: Insufficient documentation

## 2016-03-06 DIAGNOSIS — R209 Unspecified disturbances of skin sensation: Secondary | ICD-10-CM | POA: Insufficient documentation

## 2016-03-06 DIAGNOSIS — R2681 Unsteadiness on feet: Secondary | ICD-10-CM | POA: Insufficient documentation

## 2016-03-06 NOTE — Therapy (Signed)
[facility] [address] [place], [place], [address] Phone: [phone]   Fax:  [phone]  Patient Details  Name: Tony RutterRobert H Patterson MRN: [id number] Date of Birth: [birth year] Referring Provider:  Anson FretAhern, Antonia B, MD  Encounter Date: 03/06/2016  Patient arrived to PT evaluation using B axillary crutches, wearing R knee orthosis due to R knee injury sustained in recent fall. Per pt and nephew, orthopedist suspects R meniscal tear. MRI and follow up appt with orthopedist scheduled for this Friday (10/6). This PT recommended pt see orthopedist to determine nature and extend of R knee injury prior to initiating outpatient PT. Moreover, antalgic gait pattern and use of crutches will impact today's evaluation. Advised to reschedule PT evaluation until after appt on Friday (10/6). Pt and nephew verbalized understanding and were in full agreement.   Jorje GuildBlair Hobble, PT, DPT [facility] [address] [place], [place], [address] Phone: [phone]   Fax:  [phone] 03/06/16, 10:42 AM'

## 2016-03-12 ENCOUNTER — Ambulatory Visit: Payer: Managed Care, Other (non HMO) | Admitting: Physical Therapy

## 2016-03-12 ENCOUNTER — Encounter: Payer: Self-pay | Admitting: Physical Therapy

## 2016-03-12 ENCOUNTER — Ambulatory Visit: Payer: Managed Care, Other (non HMO) | Attending: Neurology | Admitting: Physical Therapy

## 2016-03-12 DIAGNOSIS — M6281 Muscle weakness (generalized): Secondary | ICD-10-CM | POA: Diagnosis present

## 2016-03-12 DIAGNOSIS — R2689 Other abnormalities of gait and mobility: Secondary | ICD-10-CM

## 2016-03-12 DIAGNOSIS — R2681 Unsteadiness on feet: Secondary | ICD-10-CM

## 2016-03-12 DIAGNOSIS — R209 Unspecified disturbances of skin sensation: Secondary | ICD-10-CM

## 2016-03-12 NOTE — Therapy (Signed)
[facility] [address] [place], [place], [address] Phone: [phone]   Fax:  [phone]  Physical Therapy Evaluation  Patient Details  Name: Tony Patterson MRN: [id number] Date of Birth: 1955-12-28 Referring Provider: Naomie Dean, MD  Encounter Date: 03/12/2016      PT End of Session - 03/12/16 1532    Visit Number 1   Number of Visits 17   Date for PT Re-Evaluation 05/11/16   Authorization Type [employer]   PT Start Time 1315   PT Stop Time 1408   PT Time Calculation (min) 53 min   Equipment Utilized During Treatment Gait belt   Activity Tolerance Patient tolerated treatment well   Behavior During Therapy [facility] for tasks assessed/performed      Past Medical History:  Diagnosis Date  . Alcohol abuse   . Anemia   . Anxiety   . Hypertension   . Neuropathy (HCC)   . Seizures (HCC)    unknown etiology and on no meds; been 4 years since seizure.    Past Surgical History:  Procedure Laterality Date  . COLONOSCOPY  05/18/2007   ONG:EXBMWUXL hemorrhoids, a single anal papilla, otherwise normal/ Single polyp, as described above, removed   . COLONOSCOPY WITH PROPOFOL N/A 03/16/2015   Procedure: COLONOSCOPY WITH PROPOFOL;  Surgeon: Corbin Ade, MD;  Location: [facility];  Service: Endoscopy;  Laterality: N/A;  in cecum at 1344. withdrawal time   . THROAT SURGERY     polyps removed x2    There were no vitals filed for this visit.       Subjective Assessment - 03/12/16 1324    Subjective Pt reports "after a long day being on my feet I tend to drag my foot".  He states that he continues to experience foot drag when on his feetor walking a long time.  Pt tripped and injured ankle around 3 months ago.     Patient is accompained by: Family member  Advertising account executive;Other (comment)  Can't lift heavy things; not comfortable driving   How long can you walk comfortably? <15  min   Patient Stated Goals "I want to drive and increase my walking distance longer than 15 min."   Currently in Pain? No/denies   Multiple Pain Sites No            [facility] PT Assessment - 03/12/16 0001      Assessment   Medical Diagnosis Falls, Abnormality of gait, Alcoholic peripheral neuropathy, Imbalance   Referring Provider Naomie Dean, MD   Onset Date/Surgical Date 02/22/16  Referral Date   Hand Dominance Left     Balance Screen   Has the patient fallen in the past 6 months Yes   How many times? 15   Has the patient had a decrease in activity level because of a fear of falling?  Yes   Is the patient reluctant to leave their home because of a fear of falling?  No     Home Environment   Living Environment Private residence   Living Arrangements Alone   Available Help at Discharge Family   Type of Home House   Home Access Stairs to enter   Entrance Stairs-Number of Steps 2   2 through the garage; 4 to the front porch   Entrance Stairs-Rails None   Home Layout One level   Home Equipment Crutches;Walker - 2 wheels;Grab bars - tub/shower;Shower seat - built in;Toilet riser  Walking sticks  Prior Function   Level of Independence Independent   Vocation Retired   Leisure Travel  Trying to do start Administrator, Civil Service Impaired by gross assessment   Additional Comments Pt reports diminished sensation in R LE below the knee and L foot, however, he is able to localize light touch in area.     ROM / Strength   AROM / PROM / Strength AROM;Strength     AROM   Overall AROM  Within functional limits for tasks performed     Strength   Overall Strength Deficits   Strength Assessment Site Hip;Knee;Ankle   Right/Left Hip Right;Left   Right Hip Flexion 3/5   Right Hip ABduction 3+/5   Left Hip Flexion 3/5   Left Hip ABduction 3+/5   Right/Left Knee Right;Left   Right Knee Flexion 3+/5   Right Knee Extension 4/5   Left Knee Flexion 3+/5   Left Knee  Extension 4/5   Right/Left Ankle Right;Left   Right Ankle Dorsiflexion 4+/5   Left Ankle Dorsiflexion 4+/5     Transfers   Transfers Sit to Stand;Stand to Sit;Stand Pivot Transfers   Sit to Stand 5: Supervision;With upper extremity assist;With armrests;From chair/3-in-1   Stand to Sit 5: Supervision;With upper extremity assist;With armrests;To chair/3-in-1   Stand Pivot Transfers 5: Supervision;With armrests     Ambulation/Gait   Ambulation/Gait Yes   Ambulation/Gait Assistance 5: Supervision   Ambulation Distance (Feet) 40 Feet  2x40', 7x30'   Assistive device None   Gait Pattern Step-through pattern;Decreased stride length;Decreased hip/knee flexion - right;Decreased hip/knee flexion - left;Trendelenburg;Trunk flexed   Ambulation Surface Level;Indoor   Gait velocity 3.83 ft/sec  Indicates community ambulator   Stairs Yes   Stairs Assistance 5: Supervision   Stair Management Technique No rails;Alternating pattern   Number of Stairs 4   Height of Stairs 6     Standardized Balance Assessment   Standardized Balance Assessment Berg Balance Test;Timed Up and Go Test     Berg Balance Test   Sit to Stand Able to stand  independently using hands   Standing Unsupported Able to stand safely 2 minutes   Sitting with Back Unsupported but Feet Supported on Floor or Stool Able to sit safely and securely 2 minutes   Stand to Sit Controls descent by using hands   Transfers Able to transfer with verbal cueing and /or supervision   Standing Unsupported with Eyes Closed Able to stand 10 seconds with supervision   Standing Ubsupported with Feet Together Able to place feet together independently and stand for 1 minute with supervision   From Standing, Reach Forward with Outstretched Arm Can reach confidently >25 cm (10")   From Standing Position, Pick up Object from Floor Able to pick up shoe safely and easily   From Standing Position, Turn to Look Behind Over each Shoulder Looks behind from both  sides and weight shifts well   Turn 360 Degrees Able to turn 360 degrees safely in 4 seconds or less   Standing Unsupported, Alternately Place Feet on Step/Stool Able to stand independently and safely and complete 8 steps in 20 seconds   Standing Unsupported, One Foot in Front Able to plae foot ahead of the other independently and hold 30 seconds   Standing on One Leg Tries to lift leg/unable to hold 3 seconds but remains standing independently   Total Score 46   Berg comment: Indicates moderate fall risk.     Functional Gait  Assessment   Gait assessed  Yes   Gait Level Surface Walks 20 ft in less than 7 sec but greater than 5.5 sec, uses assistive device, slower speed, mild gait deviations, or deviates 6-10 in outside of the 12 in walkway width.   Change in Gait Speed Able to change speed, demonstrates mild gait deviations, deviates 6-10 in outside of the 12 in walkway width, or no gait deviations, unable to achieve a major change in velocity, or uses a change in velocity, or uses an assistive device.   Gait with Horizontal Head Turns Performs head turns with moderate changes in gait velocity, slows down, deviates 10-15 in outside 12 in walkway width but recovers, can continue to walk.   Gait with Vertical Head Turns Performs task with slight change in gait velocity (eg, minor disruption to smooth gait path), deviates 6 - 10 in outside 12 in walkway width or uses assistive device   Gait and Pivot Turn Pivot turns safely within 3 sec and stops quickly with no loss of balance.   Step Over Obstacle Is able to step over 2 stacked shoe boxes taped together (9 in total height) without changing gait speed. No evidence of imbalance.   Gait with Narrow Base of Support Ambulates less than 4 steps heel to toe or cannot perform without assistance.   Gait with Eyes Closed Walks 20 ft, uses assistive device, slower speed, mild gait deviations, deviates 6-10 in outside 12 in walkway width. Ambulates 20 ft in  less than 9 sec but greater than 7 sec.   Ambulating Backwards Walks 20 ft, slow speed, abnormal gait pattern, evidence for imbalance, deviates 10-15 in outside 12 in walkway width.   Steps Alternating feet, no rail.   Total Score 19   FGA comment: Indicates medium fall risk.                           PT Education - 03/12/16 1530    Education provided Yes   Education Details Pt educated on eval results, POC, and prognosis.   Person(s) Educated Patient;Other (comment)  Nephew   Methods Explanation   Comprehension Verbalized understanding          PT Short Term Goals - 03/12/16 1549      PT SHORT TERM GOAL #1   Title Pt will be independent and verbalize understanding of initial HEP to maintain progress in therapy. (Target Date for all STGs: 04/09/16)   Time 4   Period Weeks   Status New     PT SHORT TERM GOAL #2   Title Pt will improve Berg Balance score to > or = 49/56 to indicate decr risk for falls.   Time 4   Period Weeks   Status New     PT SHORT TERM GOAL #3   Title Pt will improve FGA score to > or = 23/30 to inidicate decr risk for falls and improve functional mobility.   Time 4   Period Weeks   Status New     PT SHORT TERM GOAL #4   Title Pt will negotiate 4 stairs with mod I without the use of handrails to improve safety navigating stairs at home.   Time 4   Period Weeks   Status New     PT SHORT TERM GOAL #5   Title Pt will ambulate 56000ft with mod I indoors over levels surfaces, ramps, and curbs to indicate incr safety ambulating at home  and performing ADLs.   Time 4   Period Weeks   Status New           PT Long Term Goals - 03/12/16 1555      PT LONG TERM GOAL #1   Title Pt will be indiependent and verbalize understanding of HEP and on-going fitness plan to maintain progress made in therapy and improve overall health. (Target Date for all LTGs: 05/10/16)   Time 8   Period Weeks   Status New     PT LONG TERM GOAL #2   Title  Pt will improve Berg Balance score to > or = 52/56 to indicate impoved static balance.   Time 8   Period Weeks   Status New     PT LONG TERM GOAL #3   Title Pt will improve FGA score to > or = 27/30 to indicate improve functional mobilty and to incr safety ambulating in the community.   Time 8   Period Weeks   Status New     PT LONG TERM GOAL #4   Title Pt will negotiate 8 stairs with mod I and without use of handrails to incr safety negotiating stairs at home.   Time 8   Period Weeks   Status New     PT LONG TERM GOAL #5   Title Pt will ambulate 1070ft  with mod I outdoors over unlevel surfaces, pavement, gravel, grass, ramps, and curbs to incr safety while ambulating in the community.   Time 8   Period Weeks   Status New               Plan - 03/12/16 1534    Clinical Impression Statement Pt is a 60 year old male who presents to outpatient PT with diagnoses of falls, abnormality of gait, alcoholic peripheral neuropathy, and imbalance.  PMH is significant for alcohol abuse, anemia, anxiety, HTN, neuropathy, and seizures.  Pt's main complaint is decr sensation in B LE that effects his balance and causes him to have frequent falls.  He currently c/o pain in the R knee and has been cleared by his doctor to begin PT treatment.  Pt scored a 46/56 on the Berg Balance test indicating he is a moderate risk for falls.  He also scored a 19/30 on the FGA that is indicative of the pt being a medium risk for falls (<19 indicated high risk) and demonstrating impaired functional gait.  Pt also demonstrates decr strength in B LE that could potentially be a cause for his frequent LOB.  He reports that he has decr sensation in B LE due to neuropathy that prevents him from being able to distinguish surface changes, however, he was able to correctly localize light touch bilaterally.  Pt's condition appears to be evolving and of moderate complexity.  He would benefit from skilled PT to address his  impairments.   Rehab Potential Good   Clinical Impairments Affecting Rehab Potential Alcoholic peripheral neuropathy, HTN, Anemia   PT Frequency 2x / week   PT Duration 8 weeks   PT Treatment/Interventions ADLs/Self Care Home Management;Functional mobility training;Stair training;Gait training;DME Instruction;Therapeutic activities;Therapeutic exercise;Balance training;Neuromuscular re-education;Patient/family education;Manual techniques;Energy conservation   PT Next Visit Plan Assess pt for use of AD if needed, initiate HEP, begin balance and functional gait activities   Consulted and Agree with Plan of Care Patient;Family member/caregiver   Family Member Consulted [place], Perlie Gold      Patient will benefit from skilled therapeutic intervention in order to improve  the following deficits and impairments:  Abnormal gait, Decreased activity tolerance, Decreased balance, Decreased knowledge of use of DME, Decreased endurance, Decreased safety awareness, Decreased strength, Impaired perceived functional ability, Impaired flexibility, Impaired sensation, Improper body mechanics, Pain (Pain will be monitored but not directly addressed by PT due to nature of referral.)  Visit Diagnosis: Unsteadiness on feet - Plan: PT plan of care cert/re-cert  Other abnormalities of gait and mobility - Plan: PT plan of care cert/re-cert  Muscle weakness (generalized) - Plan: PT plan of care cert/re-cert  Unspecified disturbances of skin sensation - Plan: PT plan of care cert/re-cert     Problem List Patient Active Problem List   Diagnosis Date Noted  . Neuropathy, alcoholic (HCC) 02/22/2016  . History of adenomatous polyp of colon 03/10/2015  . Alcohol abuse, continuous 03/07/2014  . Hereditary and idiopathic peripheral neuropathy 03/07/2014    Vilinda Flake, SPT 03/12/2016, 5:19 PM  [facility] [address] [place], [place],  [address] Phone: [phone]   Fax:  [phone]  Name: Tony Patterson MRN: [id number] Date of Birth: 03/26/1956

## 2016-03-15 ENCOUNTER — Ambulatory Visit: Payer: Managed Care, Other (non HMO) | Admitting: Physical Therapy

## 2016-03-15 ENCOUNTER — Encounter: Payer: Self-pay | Admitting: Physical Therapy

## 2016-03-15 DIAGNOSIS — R2689 Other abnormalities of gait and mobility: Secondary | ICD-10-CM

## 2016-03-15 DIAGNOSIS — R2681 Unsteadiness on feet: Secondary | ICD-10-CM | POA: Diagnosis not present

## 2016-03-15 NOTE — Patient Instructions (Signed)
Feet Together, Head Motion - Eyes Closed    Stand with your back to a corner with a stable chair in front of you. With eyes closed and feet together, move head slowly: up and down 10 times; up and down 10 times.  Do __2-3__ sessions per day.  Feet Apart (Compliant Surface) Head Motion - Eyes Closed    Stand with your back to a corner with a stable chair in front of you. Stand on 2 pillows (or one couch cushion) with feet shoulder width apart. Close eyes and move head slowly: up and down 10 times; right to left 10 times. Repeat __2-3__ times per day.  Walking Head Turn    Standing close to a wall, walk __20__ feet while turning head from right to left (2 steps with head to right; then 2 steps with head to left). Touch wall if necessary to keep balance. Practice walking in this way for 2-3 minutes, __1-2__ times per day. Copyright  [employer]. All rights reserved.

## 2016-03-15 NOTE — Therapy (Signed)
[facility] [address] [place], [place], [address] Phone: [phone]   Fax:  [phone]  Physical Therapy Treatment  Patient Details  Name: Tony Patterson MRN: [id number] Date of Birth: [birth year] Referring Provider: Naomie Dean, MD  Encounter Date: 03/15/2016      PT End of Session - 03/15/16 1258    Visit Number 2   Number of Visits 17   Date for PT Re-Evaluation 05/11/16   Authorization Type Aetna/A[employer] Management   PT Start Time 1148   PT Stop Time 1237   PT Time Calculation (min) 49 min   Activity Tolerance Patient tolerated treatment well   Behavior During Therapy [facility] for tasks assessed/performed      Past Medical History:  Diagnosis Date  . Alcohol abuse   . Anemia   . Anxiety   . Hypertension   . Neuropathy (HCC)   . Seizures (HCC)    unknown etiology and on no meds; been 4 years since seizure.    Past Surgical History:  Procedure Laterality Date  . COLONOSCOPY  05/18/2007   ONG:EXBMWUXL hemorrhoids, a single anal papilla, otherwise normal/ Single polyp, as described above, removed   . COLONOSCOPY WITH PROPOFOL N/A 03/16/2015   Procedure: COLONOSCOPY WITH PROPOFOL;  Surgeon: Corbin Ade, MD;  Location: [facility];  Service: Endoscopy;  Laterality: N/A;  in cecum at 1344. withdrawal time   . THROAT SURGERY     polyps removed x2    There were no vitals filed for this visit.      Subjective Assessment - 03/15/16 1149    Subjective Pt reports he saw his PCP yesterday and he "wanted me to do more walking around my neighborhood and to use my recumbent bike."  No falls or issues since last visit.   Patient is accompained by: Family member  Advertising account executive;Other (comment)  Can't lift heavy things; not comfortable driving   How long can you walk comfortably? <15 min   Patient Stated Goals "I want to drive and increase my walking distance longer than 15 min."   Currently in Pain? No/denies   Multiple Pain Sites No            [facility] PT Assessment - 03/15/16 1145      Assessment   Medical Diagnosis Falls, Abnormality of gait, Alcoholic peripheral neuropathy, Imbalance   Referring Provider Naomie Dean, MD   Onset Date/Surgical Date 02/22/16  Referral Date            Vestibular Assessment - 03/15/16 0001      Positional Testing   Dix-Hallpike Dix-Hallpike Right;Dix-Hallpike Left     Dix-Hallpike Right   Dix-Hallpike Right Duration NA   Dix-Hallpike Right Symptoms No nystagmus     Dix-Hallpike Left   Dix-Hallpike Left Duration NA   Dix-Hallpike Left Symptoms No nystagmus     Orthostatics   BP supine (x 5 minutes) 128/87   HR supine (x 5 minutes) 80   BP standing (after 1 minute) 75/54  lightheadedness (concordant)   HR standing (after 1 minute) 96   Orthostatics Comment Pt did not stand for full 3 min due to significant decr in BP. After semi reclined, LE therex x3-5 minutes, BP increased to 121/87, HR 82.                 [facility] Adult PT Treatment/Exercise - 03/15/16 1145      Transfers   Transfers Sit to Stand;Stand to [employer]  Pivot Transfers   Sit to Stand With upper extremity assist;From bed;6: Modified independent (Device/Increase time)   Sit to Stand Details (indicate cue type and reason) Pt c/o "light headedness" upon standing that warrented further investigation into orthostatic causes.   Stand to Sit With upper extremity assist;To bed;6: Modified independent (Device/Increase time)   Stand Pivot Transfers 6: Modified independent (Device/Increase time)     Ambulation/Gait   Ambulation/Gait Yes   Ambulation/Gait Assistance 6: Modified independent (Device/Increase time)   Ambulation Distance (Feet) 500 Feet  1x500', 2x100', 7x30'   Assistive device None   Gait Pattern Step-through pattern;Decreased stride length;Trunk flexed   Ambulation Surface Level;Indoor;Unlevel;Outdoor;Paved   Gait Comments Pt  ambulated outdoors to determine if he needed an AD when ambulating in the community.  Pt demonstrated no causes for concern and was advised to be aware of his surroundings.     High Level Balance   High Level Balance Activities Head turns   High Level Balance Comments Pt performed multiple rounds of gait with head turns to challenge dynamic gait and mobility.  Pt required intermittent UE assist from wall to maintain balance with minor deviations in gait path.             Balance Exercises - 03/15/16 1251      Balance Exercises: Standing   Standing Eyes Closed Narrow base of support (BOS);Wide (BOA);Head turns;Foam/compliant surface;Solid surface;30 secs  Corner balance exercises   Gait with Head Turns --           PT Education - 03/15/16 1256    Education provided Yes   Education Details Pt educated on new HEP and new set up for therapy sessions.  Due to high co-pay, pt will now be seen once every other week.   Person(s) Educated Patient;Caregiver(s)  Jayme Cloud   Methods Explanation;Verbal cues;Handout   Comprehension Returned demonstration;Verbalized understanding          PT Short Term Goals - 03/12/16 1549      PT SHORT TERM GOAL #1   Title Pt will be independent and verbalize understanding of initial HEP to maintain progress in therapy. (Target Date for all STGs: 04/09/16)   Time 4   Period Weeks   Status New     PT SHORT TERM GOAL #2   Title Pt will improve Berg Balance score to > or = 49/56 to indicate decr risk for falls.   Time 4   Period Weeks   Status New     PT SHORT TERM GOAL #3   Title Pt will improve FGA score to > or = 23/30 to inidicate decr risk for falls and improve functional mobility.   Time 4   Period Weeks   Status New     PT SHORT TERM GOAL #4   Title Pt will negotiate 4 stairs with mod I without the use of handrails to improve safety navigating stairs at home.   Time 4   Period Weeks   Status New     PT SHORT TERM GOAL #5    Title Pt will ambulate 514ft with mod I indoors over levels surfaces, ramps, and curbs to indicate incr safety ambulating at home and performing ADLs.   Time 4   Period Weeks   Status New           PT Long Term Goals - 03/12/16 1555      PT LONG TERM GOAL #1   Title Pt will be indiependent and verbalize understanding of  HEP and on-going fitness plan to maintain progress made in therapy and improve overall health. (Target Date for all LTGs: 05/10/16)   Time 8   Period Weeks   Status New     PT LONG TERM GOAL #2   Title Pt will improve Berg Balance score to > or = 52/56 to indicate impoved static balance.   Time 8   Period Weeks   Status New     PT LONG TERM GOAL #3   Title Pt will improve FGA score to > or = 27/30 to indicate improve functional mobilty and to incr safety ambulating in the community.   Time 8   Period Weeks   Status New     PT LONG TERM GOAL #4   Title Pt will negotiate 8 stairs with mod I and without use of handrails to incr safety negotiating stairs at home.   Time 8   Period Weeks   Status New     PT LONG TERM GOAL #5   Title Pt will ambulate 105400ft  with mod I outdoors over unlevel surfaces, pavement, gravel, grass, ramps, and curbs to incr safety while ambulating in the community.   Time 8   Period Weeks   Status New               Plan - 03/15/16 1259    Clinical Impression Statement Pt arrived to therapy with c/o dizziness that he had been experiencing since he left to come to session although he stated that it wasn't "anything to worry about".  However, after performing gait with head turns, pt stated that his dizziness had worsened.  Dix-Hallpike was neg and asymptomatic bilaterally.  Pt did experience incr in light headedness upon sitting up from Dix-Hallpike indicating further investigation into orthostatic causes.  During orthostatic testing, pt's BP dropped from 128/87 to 75/54 from supine to standing with pt reporting incr in symptoms.   Testing indicates significant postural hypotension and pt was advised on various methods to prevent symptoms from occuring.  Due to financial reasons, PT and pt agreed to reduce therapy session to once every other week with the pt performing his HEP regularly.  He would continue to benefit from skilled PT to address his decr in functional gait and balance and to improve orthostatic symptoms.   Rehab Potential Good   Clinical Impairments Affecting Rehab Potential Alcoholic peripheral neuropathy, HTN, Anemia   PT Frequency Other (comment)  Once every other week   PT Duration 8 weeks   PT Treatment/Interventions ADLs/Self Care Home Management;Functional mobility training;Stair training;Gait training;DME Instruction;Therapeutic activities;Therapeutic exercise;Balance training;Neuromuscular re-education;Patient/family education;Manual techniques;Energy conservation   PT Next Visit Plan Progress pt's HEP for functional gait and balance; assess orthostatic symptoms   Consulted and Agree with Plan of Care Patient;Family member/caregiver   Family Member Consulted [place] Flat[place]ephew, Perlie GoldRussell      Patient will benefit from skilled therapeutic intervention in order to improve the following deficits and impairments:  Abnormal gait, Decreased activity tolerance, Decreased balance, Decreased knowledge of use of DME, Decreased endurance, Decreased safety awareness, Decreased strength, Impaired perceived functional ability, Impaired flexibility, Impaired sensation, Improper body mechanics, Pain (Pain will be monitored but not directly addressed by PT due to nature of referral.)  Visit Diagnosis: Unsteadiness on feet  Other abnormalities of gait and mobility     Problem List Patient Active Problem List   Diagnosis Date Noted  . Neuropathy, alcoholic (HCC) 02/22/2016  . History of adenomatous polyp of colon 03/10/2015  . Alcohol  abuse, continuous 03/07/2014  . Hereditary and idiopathic peripheral neuropathy 03/07/2014     Vilinda Flake, SPT 03/15/2016, 4:19 PM  [facility] [address] [place], [place], [address] Phone: [phone]   Fax:  [phone]  Name: Tony Patterson MRN: [id number] Date of Birth: 1956/05/07

## 2016-03-19 ENCOUNTER — Ambulatory Visit: Payer: Managed Care, Other (non HMO) | Admitting: Physical Therapy

## 2016-03-21 ENCOUNTER — Ambulatory Visit: Payer: Managed Care, Other (non HMO) | Admitting: Physical Therapy

## 2016-03-26 ENCOUNTER — Ambulatory Visit: Payer: Managed Care, Other (non HMO) | Admitting: Physical Therapy

## 2016-03-26 DIAGNOSIS — R2689 Other abnormalities of gait and mobility: Secondary | ICD-10-CM

## 2016-03-26 DIAGNOSIS — R2681 Unsteadiness on feet: Secondary | ICD-10-CM | POA: Diagnosis not present

## 2016-03-26 DIAGNOSIS — R209 Unspecified disturbances of skin sensation: Secondary | ICD-10-CM

## 2016-03-26 DIAGNOSIS — M6281 Muscle weakness (generalized): Secondary | ICD-10-CM

## 2016-03-26 NOTE — Therapy (Addendum)
[facility] [address] [place], [place], [address] Phone: [phone]   Fax:  [phone]  Physical Therapy Treatment and Discharge Summary  Patient Details  Name: Tony Patterson MRN: [id number] Date of Birth: March 28, 1956 Referring Provider: Sarina Ill, MD  Encounter Date: 03/26/2016      PT End of Session - 03/26/16 0946    Visit Number 3   Number of Visits 17   Date for PT Re-Evaluation 05/11/16   Authorization Type Aetna/Aetna Management   PT Start Time 0848   PT Stop Time 0930   PT Time Calculation (min) 42 min   Activity Tolerance Patient tolerated treatment well   Behavior During Therapy [facility] for tasks assessed/performed      Past Medical History:  Diagnosis Date  . Alcohol abuse   . Anemia   . Anxiety   . Hypertension   . Neuropathy ([place])   . Seizures ([place])    unknown etiology and on no meds; been 4 years since seizure.    Past Surgical History:  Procedure Laterality Date  . COLONOSCOPY  05/18/2007   YME:BRAXENMM hemorrhoids, a single anal papilla, otherwise normal/ Single polyp, as described above, removed   . COLONOSCOPY WITH PROPOFOL N/A 03/16/2015   Procedure: COLONOSCOPY WITH PROPOFOL;  Surgeon: Daneil Dolin, MD;  Location: [facility];  Service: Endoscopy;  Laterality: N/A;  in cecum at 1344. withdrawal time  33mn  . THROAT SURGERY     polyps removed x2    There were no vitals filed for this visit.      Subjective Assessment - 03/26/16 0849    Subjective Pt reports he had a bug last week, so he got a shot of Phenergan last week.   Patient is accompained by: Family member  nephew, Tony Patterson  Limitations Walking;Standing;Other (comment)   Patient Stated Goals "I want to drive and increase my walking distance longer than 15 min."   Currently in Pain? Yes   Pain Score 8    Pain Location Back   Pain Orientation Right;Left;Lower   Pain Descriptors / Indicators Sharp   Pain Type Chronic  pain   Pain Onset In the past 7 days   Pain Frequency Constant   Aggravating Factors  standing up after prolonged sitting   Pain Relieving Factors Tylenol   Multiple Pain Sites No                         [facility] Adult PT Treatment/Exercise - 03/26/16 0001      Ambulation/Gait   Ambulation/Gait Yes   Ambulation/Gait Assistance 6: Modified independent (Device/Increase time)   Ambulation Distance (Feet) 300 Feet   Assistive device None   Gait Pattern Step-through pattern;Decreased stride length;Trunk flexed;Trendelenburg;Lateral hip instability   Ambulation Surface Level;Indoor   Stairs Yes   Stairs Assistance 4: Min guard;5: Supervision   Stairs Assistance Details (indicate cue type and reason) Initial trial with pt demonstrating preferred technique for stair negotiation (reciprocal pattern), which required min guard due to decreased stability/balance. With verbal/demo cueing, pt negotiated subsequent 4 stairs x2 trials with step-to pattern, leading with RLE to ascend, with LLE to descend with verbal reminders.   Stair Management Technique No rails;Alternating pattern;Step to pattern;Forwards  no rails to simulate home entrance   Number of Stairs 12  4 stairs x3 consecutive trials   Height of Stairs 6     Therapeutic Activites    Therapeutic Activities Other Therapeutic Activities  Other Therapeutic Activities Explained and demonstrated use of logroll technique with supine <> sit to decrease excessive thoracolumbar rotation and to mitigate pain. Also provided cueing for TA activation during transitional movements, which pt reports mitigates low back pain.     Exercises   Exercises Other Exercises   Other Exercises  R clamshells x10 reps without resistance, then progressed to x10 reps bilaterally with resistance of green Tband with cueing for alignment, technique. Performed supine transverse abdominus activation (pillow case between lumbar spine and mat table) 5 x5-sec  holds; progressed to TA activation with concurrent Le marching x5 reps then x10 reps.              Balance Exercises - 03/26/16 0945      Balance Exercises: Standing   Standing Eyes Closed Wide (BOA);Head turns;Foam/compliant surface;Other reps (comment)  1 pillow; horiz, vertical, diagonal head turns x10 each           PT Education - 03/26/16 0936    Education provided Yes   Education Details Reviewed/modified HEP. Explained and utilized video to convey impact of lateral hip weakness, Trendelenburg gait pattern on low back.    Person(s) Educated Patient;Other (comment)  newphew, Tony Patterson   Methods Explanation;Demonstration;Handout;Verbal cues;Tactile cues   Comprehension Verbalized understanding;Returned demonstration          PT Short Term Goals - 03/12/16 1549      PT SHORT TERM GOAL #1   Title Pt will be independent and verbalize understanding of initial HEP to maintain progress in therapy. (Target Date for all STGs: 04/09/16)   Time 4   Period Weeks   Status New     PT SHORT TERM GOAL #2   Title Pt will improve Berg Balance score to > or = 49/56 to indicate decr risk for falls.   Time 4   Period Weeks   Status New     PT SHORT TERM GOAL #3   Title Pt will improve FGA score to > or = 23/30 to inidicate decr risk for falls and improve functional mobility.   Time 4   Period Weeks   Status New     PT SHORT TERM GOAL #4   Title Pt will negotiate 4 stairs with mod I without the use of handrails to improve safety navigating stairs at home.   Time 4   Period Weeks   Status New     PT SHORT TERM GOAL #5   Title Pt will ambulate 577f with mod I indoors over levels surfaces, ramps, and curbs to indicate incr safety ambulating at home and performing ADLs.   Time 4   Period Weeks   Status New           PT Long Term Goals - 03/12/16 1555      PT LONG TERM GOAL #1   Title Pt will be indiependent and verbalize understanding of HEP and on-going fitness plan  to maintain progress made in therapy and improve overall health. (Target Date for all LTGs: 05/10/16)   Time 8   Period Weeks   Status New     PT LONG TERM GOAL #2   Title Pt will improve Berg Balance score to > or = 52/56 to indicate impoved static balance.   Time 8   Period Weeks   Status New     PT LONG TERM GOAL #3   Title Pt will improve FGA score to > or = 27/30 to indicate improve functional mobilty and to incr  safety ambulating in the community.   Time 8   Period Weeks   Status New     PT LONG TERM GOAL #4   Title Pt will negotiate 8 stairs with mod I and without use of handrails to incr safety negotiating stairs at home.   Time 8   Period Weeks   Status New     PT LONG TERM GOAL #5   Title Pt will ambulate 1079f  with mod I outdoors over unlevel surfaces, pavement, gravel, grass, ramps, and curbs to incr safety while ambulating in the community.   Time 8   Period Weeks   Status New               Plan - 03/26/16 0947    Clinical Impression Statement Skilled session focused on reviewing/modifying HEP, addressing back pain, and increasing pt safety using primary home entrance. Back pain appears to be exacerbated by transitional movements; unable to rule out core/hip muscle weakness as attributing factor. Pain mitigated by core muscle activation and modification of body mechanics with functional mobility.    Rehab Potential Good   Clinical Impairments Affecting Rehab Potential Alcoholic peripheral neuropathy, HTN, Anemia   PT Frequency Other (comment)  once every other week   PT Duration 8 weeks   PT Treatment/Interventions ADLs/Self Care Home Management;Functional mobility training;Stair training;Gait training;DME Instruction;Therapeutic activities;Therapeutic exercise;Balance training;Neuromuscular re-education;Patient/family education;Manual techniques;Energy conservation   PT Next Visit Plan Check STG's. Assess current HEP and progress prn.   Consulted and  Agree with Plan of Care Patient;Family member/caregiver   Family Member Consulted [place]ie[place] Tony Patterson     Patient will benefit from skilled therapeutic intervention in order to improve the following deficits and impairments:  Abnormal gait, Decreased activity tolerance, Decreased balance, Decreased knowledge of use of DME, Decreased endurance, Decreased safety awareness, Decreased strength, Impaired perceived functional ability, Impaired flexibility, Impaired sensation, Improper body mechanics, Pain  Visit Diagnosis: Unsteadiness on feet  Other abnormalities of gait and mobility  Muscle weakness (generalized)  Unspecified disturbances of skin sensation     Problem List Patient Active Problem List   Diagnosis Date Noted  . Neuropathy, [place] ([place] 031/51/7616 . History of adenomatous polyp of colon 03/10/2015  . Alcohol abuse, continuous 03/07/2014  . Hereditary and idiopathic peripheral neuropathy 03/07/2014   BBillie Ruddy PT, DPT CBlake [facility]urel Mountain[place]ontcal[place] [address]Phone: [phone]  Fax:  [phone]/24/17, 9:50 AM  Name: Tony CONDREYMRN: [id number] of Birth: 516-Dec-1957  Addendum: PHYSICAL THERAPY DISCHARGE SUMMARY  Visits from Start of Care: 3  Current functional level related to goals / functional outcomes: Unknown, as patient did not return to PT after initial 3 sessions.    Remaining deficits: Unknown, as patient did not return to PT after initial 3 sessions.    Education / Equipment: See above.  Plan: Patient agrees to discharge.  Patient goals were not met. Patient is being discharged due to being pleased with the current functional level.  ?????         BBillie Ruddy PT, DBlackduck962 Rockville StreetSSpringfieldGEast Middle[place] [address]Phone: [phone]  Fax:  [phone]/16/17, 9:11 AM

## 2016-03-26 NOTE — Patient Instructions (Addendum)
Feet Apart (Compliant Surface) Head Motion - Eyes Closed    Stand with your back to a corner with a stable chair in front of you. Stand on 1 pillow with feet shoulder width apart. Close eyes and move head slowly: up and down 10 times; right to left 10 times; diagonally from up-right to down-left 10 times; and diagonally from up-left to down-right 10 times. Repeat __2-3__ times per day.   Walking Head Turn    Standing close to a wall, walk __20__ feet while turning head from right to left (2 steps with head to right; then 2 steps with head to left). Touch wall if necessary to keep balance. Repeat walking in the same way, but while turning head up to down. Practice walking in this way for 2-3 minutes, __1-2__ times per day.  HIP: Abduction / External Rotation (Band)    Place GREEN and around knees. Lie on side with hips and knees bent. Raise top knee up, squeezing glutes. * Don't let your top hip roll backwards. Keep feet together. Hold _2__ seconds. _10__ reps per set, _2__ sets per day. Copyright  [employer]. All rights reserved.

## 2016-03-29 ENCOUNTER — Ambulatory Visit: Payer: Managed Care, Other (non HMO) | Admitting: Physical Therapy

## 2016-04-02 ENCOUNTER — Ambulatory Visit: Payer: Managed Care, Other (non HMO) | Admitting: Physical Therapy

## 2016-04-04 ENCOUNTER — Ambulatory Visit: Payer: Managed Care, Other (non HMO) | Admitting: Physical Therapy

## 2016-04-09 ENCOUNTER — Ambulatory Visit: Payer: Managed Care, Other (non HMO) | Admitting: Physical Therapy

## 2016-04-11 ENCOUNTER — Ambulatory Visit: Payer: Managed Care, Other (non HMO) | Admitting: Physical Therapy

## 2016-04-12 ENCOUNTER — Emergency Department (HOSPITAL_COMMUNITY): Payer: Managed Care, Other (non HMO)

## 2016-04-12 ENCOUNTER — Inpatient Hospital Stay (HOSPITAL_COMMUNITY)
Admission: EM | Admit: 2016-04-12 | Discharge: 2016-04-18 | DRG: 208 | Disposition: A | Discharge status: Skilled Nursing Facility | Payer: Managed Care, Other (non HMO) | Attending: Internal Medicine | Admitting: Internal Medicine

## 2016-04-12 ENCOUNTER — Inpatient Hospital Stay (HOSPITAL_COMMUNITY): Payer: Managed Care, Other (non HMO)

## 2016-04-12 ENCOUNTER — Encounter (HOSPITAL_COMMUNITY): Payer: Self-pay | Admitting: Emergency Medicine

## 2016-04-12 DIAGNOSIS — J9601 Acute respiratory failure with hypoxia: Secondary | ICD-10-CM | POA: Diagnosis present

## 2016-04-12 DIAGNOSIS — R7401 Elevation of levels of liver transaminase levels: Secondary | ICD-10-CM

## 2016-04-12 DIAGNOSIS — N179 Acute kidney failure, unspecified: Secondary | ICD-10-CM

## 2016-04-12 DIAGNOSIS — G4089 Other seizures: Secondary | ICD-10-CM | POA: Diagnosis present

## 2016-04-12 DIAGNOSIS — E785 Hyperlipidemia, unspecified: Secondary | ICD-10-CM | POA: Diagnosis present

## 2016-04-12 DIAGNOSIS — E46 Unspecified protein-calorie malnutrition: Secondary | ICD-10-CM

## 2016-04-12 DIAGNOSIS — R4182 Altered mental status, unspecified: Secondary | ICD-10-CM | POA: Diagnosis not present

## 2016-04-12 DIAGNOSIS — A4901 Methicillin susceptible Staphylococcus aureus infection, unspecified site: Secondary | ICD-10-CM | POA: Diagnosis not present

## 2016-04-12 DIAGNOSIS — M6282 Rhabdomyolysis: Secondary | ICD-10-CM | POA: Diagnosis present

## 2016-04-12 DIAGNOSIS — Z87891 Personal history of nicotine dependence: Secondary | ICD-10-CM

## 2016-04-12 DIAGNOSIS — Z880 Allergy status to penicillin: Secondary | ICD-10-CM | POA: Diagnosis not present

## 2016-04-12 DIAGNOSIS — F1010 Alcohol abuse, uncomplicated: Secondary | ICD-10-CM

## 2016-04-12 DIAGNOSIS — R509 Fever, unspecified: Secondary | ICD-10-CM | POA: Diagnosis present

## 2016-04-12 DIAGNOSIS — D62 Acute posthemorrhagic anemia: Secondary | ICD-10-CM

## 2016-04-12 DIAGNOSIS — I10 Essential (primary) hypertension: Secondary | ICD-10-CM

## 2016-04-12 DIAGNOSIS — F10230 Alcohol dependence with withdrawal, uncomplicated: Secondary | ICD-10-CM | POA: Diagnosis not present

## 2016-04-12 DIAGNOSIS — W19XXXA Unspecified fall, initial encounter: Secondary | ICD-10-CM

## 2016-04-12 DIAGNOSIS — Z79899 Other long term (current) drug therapy: Secondary | ICD-10-CM | POA: Diagnosis not present

## 2016-04-12 DIAGNOSIS — Z6825 Body mass index (BMI) 25.0-25.9, adult: Secondary | ICD-10-CM

## 2016-04-12 DIAGNOSIS — E873 Alkalosis: Secondary | ICD-10-CM | POA: Diagnosis present

## 2016-04-12 DIAGNOSIS — J189 Pneumonia, unspecified organism: Secondary | ICD-10-CM

## 2016-04-12 DIAGNOSIS — R748 Abnormal levels of other serum enzymes: Secondary | ICD-10-CM | POA: Diagnosis present

## 2016-04-12 DIAGNOSIS — G40901 Epilepsy, unspecified, not intractable, with status epilepticus: Secondary | ICD-10-CM | POA: Diagnosis not present

## 2016-04-12 DIAGNOSIS — K7010 Alcoholic hepatitis without ascites: Secondary | ICD-10-CM | POA: Diagnosis present

## 2016-04-12 DIAGNOSIS — Z8249 Family history of ischemic heart disease and other diseases of the circulatory system: Secondary | ICD-10-CM

## 2016-04-12 DIAGNOSIS — E876 Hypokalemia: Secondary | ICD-10-CM

## 2016-04-12 DIAGNOSIS — R4020 Unspecified coma: Secondary | ICD-10-CM

## 2016-04-12 DIAGNOSIS — F329 Major depressive disorder, single episode, unspecified: Secondary | ICD-10-CM | POA: Diagnosis present

## 2016-04-12 DIAGNOSIS — G621 Alcoholic polyneuropathy: Secondary | ICD-10-CM | POA: Diagnosis present

## 2016-04-12 DIAGNOSIS — F10239 Alcohol dependence with withdrawal, unspecified: Secondary | ICD-10-CM | POA: Diagnosis present

## 2016-04-12 DIAGNOSIS — E8809 Other disorders of plasma-protein metabolism, not elsewhere classified: Secondary | ICD-10-CM

## 2016-04-12 DIAGNOSIS — F1020 Alcohol dependence, uncomplicated: Secondary | ICD-10-CM | POA: Diagnosis not present

## 2016-04-12 DIAGNOSIS — I129 Hypertensive chronic kidney disease with stage 1 through stage 4 chronic kidney disease, or unspecified chronic kidney disease: Secondary | ICD-10-CM | POA: Diagnosis present

## 2016-04-12 DIAGNOSIS — Z91030 Bee allergy status: Secondary | ICD-10-CM

## 2016-04-12 DIAGNOSIS — Z818 Family history of other mental and behavioral disorders: Secondary | ICD-10-CM

## 2016-04-12 DIAGNOSIS — F419 Anxiety disorder, unspecified: Secondary | ICD-10-CM | POA: Diagnosis present

## 2016-04-12 DIAGNOSIS — F10939 Alcohol use, unspecified with withdrawal, unspecified: Secondary | ICD-10-CM

## 2016-04-12 DIAGNOSIS — J9811 Atelectasis: Secondary | ICD-10-CM | POA: Diagnosis present

## 2016-04-12 DIAGNOSIS — N183 Chronic kidney disease, stage 3 (moderate): Secondary | ICD-10-CM | POA: Diagnosis present

## 2016-04-12 DIAGNOSIS — M6281 Muscle weakness (generalized): Secondary | ICD-10-CM

## 2016-04-12 DIAGNOSIS — N400 Enlarged prostate without lower urinary tract symptoms: Secondary | ICD-10-CM | POA: Diagnosis present

## 2016-04-12 DIAGNOSIS — R740 Nonspecific elevation of levels of transaminase and lactic acid dehydrogenase [LDH]: Secondary | ICD-10-CM

## 2016-04-12 DIAGNOSIS — G9341 Metabolic encephalopathy: Secondary | ICD-10-CM | POA: Diagnosis present

## 2016-04-12 DIAGNOSIS — R569 Unspecified convulsions: Secondary | ICD-10-CM | POA: Diagnosis not present

## 2016-04-12 DIAGNOSIS — N189 Chronic kidney disease, unspecified: Secondary | ICD-10-CM | POA: Diagnosis present

## 2016-04-12 DIAGNOSIS — F32A Depression, unspecified: Secondary | ICD-10-CM | POA: Diagnosis present

## 2016-04-12 DIAGNOSIS — Z9911 Dependence on respirator [ventilator] status: Secondary | ICD-10-CM

## 2016-04-12 DIAGNOSIS — J690 Pneumonitis due to inhalation of food and vomit: Principal | ICD-10-CM | POA: Diagnosis present

## 2016-04-12 DIAGNOSIS — A419 Sepsis, unspecified organism: Secondary | ICD-10-CM

## 2016-04-12 DIAGNOSIS — F10231 Alcohol dependence with withdrawal delirium: Secondary | ICD-10-CM | POA: Diagnosis not present

## 2016-04-12 DIAGNOSIS — Z8269 Family history of other diseases of the musculoskeletal system and connective tissue: Secondary | ICD-10-CM

## 2016-04-12 DIAGNOSIS — Z800 Family history of malignant neoplasm of digestive organs: Secondary | ICD-10-CM

## 2016-04-12 DIAGNOSIS — Z978 Presence of other specified devices: Secondary | ICD-10-CM

## 2016-04-12 LAB — BRAIN NATRIURETIC PEPTIDE: B NATRIURETIC PEPTIDE 5: 610 pg/mL — AB (ref 0.0–100.0)

## 2016-04-12 LAB — CBC
HEMATOCRIT: 36 % — AB (ref 39.0–52.0)
HEMOGLOBIN: 11.6 g/dL — AB (ref 13.0–17.0)
MCH: 28.9 pg (ref 26.0–34.0)
MCHC: 32.2 g/dL (ref 30.0–36.0)
MCV: 89.6 fL (ref 78.0–100.0)
Platelets: 146 10*3/uL — ABNORMAL LOW (ref 150–400)
RBC: 4.02 MIL/uL — AB (ref 4.22–5.81)
RDW: 16.5 % — ABNORMAL HIGH (ref 11.5–15.5)
WBC: 8.4 10*3/uL (ref 4.0–10.5)

## 2016-04-12 LAB — BLOOD GAS, ARTERIAL
Acid-base deficit: 0.4 mmol/L (ref 0.0–2.0)
Bicarbonate: 24.5 mmol/L (ref 20.0–28.0)
Drawn by: 221791
FIO2: 100
O2 SAT: 99.7 %
PEEP: 5 cmH2O
PH ART: 7.444 (ref 7.350–7.450)
Patient temperature: 37
RATE: 14 resp/min
VT: 550 mL
pCO2 arterial: 34.4 mmHg (ref 32.0–48.0)
pO2, Arterial: 337 mmHg — ABNORMAL HIGH (ref 83.0–108.0)

## 2016-04-12 LAB — URINALYSIS, ROUTINE W REFLEX MICROSCOPIC
GLUCOSE, UA: NEGATIVE mg/dL
Ketones, ur: 15 mg/dL — AB
LEUKOCYTES UA: NEGATIVE
NITRITE: NEGATIVE
PH: 6.5 (ref 5.0–8.0)
Protein, ur: >300 mg/dL — AB
Specific Gravity, Urine: 1.025 (ref 1.005–1.030)

## 2016-04-12 LAB — BLOOD GAS, VENOUS
ACID-BASE DEFICIT: 1.5 mmol/L (ref 0.0–2.0)
BICARBONATE: 24 mmol/L (ref 20.0–28.0)
FIO2: 100
O2 Saturation: 99 %
PATIENT TEMPERATURE: 37
PH VEN: 7.475 — AB (ref 7.250–7.430)
PO2 VEN: 96.3 mmHg — AB (ref 32.0–45.0)
pCO2, Ven: 1.9 mmHg — CL (ref 44.0–60.0)

## 2016-04-12 LAB — COMPREHENSIVE METABOLIC PANEL
ALK PHOS: 71 U/L (ref 38–126)
ALT: 68 U/L — ABNORMAL HIGH (ref 17–63)
AST: 344 U/L — AB (ref 15–41)
Albumin: 4 g/dL (ref 3.5–5.0)
Anion gap: 17 — ABNORMAL HIGH (ref 5–15)
BILIRUBIN TOTAL: 2.1 mg/dL — AB (ref 0.3–1.2)
BUN: 19 mg/dL (ref 6–20)
CALCIUM: 9.1 mg/dL (ref 8.9–10.3)
CO2: 22 mmol/L (ref 22–32)
Chloride: 99 mmol/L — ABNORMAL LOW (ref 101–111)
Creatinine, Ser: 2.04 mg/dL — ABNORMAL HIGH (ref 0.61–1.24)
GFR calc Af Amer: 39 mL/min — ABNORMAL LOW (ref >60)
GFR, EST NON AFRICAN AMERICAN: 34 mL/min — AB (ref >60)
Glucose, Bld: 136 mg/dL — ABNORMAL HIGH (ref 65–99)
POTASSIUM: 4 mmol/L (ref 3.5–5.1)
Sodium: 138 mmol/L (ref 135–145)
TOTAL PROTEIN: 7.7 g/dL (ref 6.5–8.1)

## 2016-04-12 LAB — CSF CELL COUNT WITH DIFFERENTIAL
RBC COUNT CSF: 20 /mm3 — AB
Tube #: 1
WBC CSF: 2 /mm3 (ref 0–5)

## 2016-04-12 LAB — TROPONIN I
TROPONIN I: 0.96 ng/mL — AB (ref <0.03)
TROPONIN I: 0.68 ng/mL — AB (ref <0.03)

## 2016-04-12 LAB — URINE MICROSCOPIC-ADD ON
SQUAMOUS EPITHELIAL / LPF: NONE SEEN
WBC, UA: NONE SEEN WBC/hpf (ref 0–5)

## 2016-04-12 LAB — I-STAT CG4 LACTIC ACID, ED: LACTIC ACID, VENOUS: 3.54 mmol/L — AB (ref 0.5–1.9)

## 2016-04-12 LAB — CBC WITH DIFFERENTIAL/PLATELET
BASOS ABS: 0 10*3/uL (ref 0.0–0.1)
BASOS PCT: 0 %
EOS ABS: 0 10*3/uL (ref 0.0–0.7)
EOS PCT: 0 %
HCT: 41.8 % (ref 39.0–52.0)
Hemoglobin: 13.5 g/dL (ref 13.0–17.0)
LYMPHS PCT: 6 %
Lymphs Abs: 0.6 10*3/uL — ABNORMAL LOW (ref 0.7–4.0)
MCH: 29 pg (ref 26.0–34.0)
MCHC: 32.3 g/dL (ref 30.0–36.0)
MCV: 89.7 fL (ref 78.0–100.0)
MONO ABS: 0.7 10*3/uL (ref 0.1–1.0)
Monocytes Relative: 7 %
Neutro Abs: 8.9 10*3/uL — ABNORMAL HIGH (ref 1.7–7.7)
Neutrophils Relative %: 87 %
Platelets: 189 10*3/uL (ref 150–400)
RBC: 4.66 MIL/uL (ref 4.22–5.81)
RDW: 16.5 % — AB (ref 11.5–15.5)
WBC: 10.3 10*3/uL (ref 4.0–10.5)

## 2016-04-12 LAB — LACTIC ACID, PLASMA
LACTIC ACID, VENOUS: 1.7 mmol/L (ref 0.5–1.9)
LACTIC ACID, VENOUS: 1.4 mmol/L (ref 0.5–1.9)

## 2016-04-12 LAB — POCT I-STAT 3, ART BLOOD GAS (G3+)
Acid-base deficit: 1 mmol/L (ref 0.0–2.0)
Bicarbonate: 20.6 mmol/L (ref 20.0–28.0)
O2 SAT: 100 %
PCO2 ART: 25.7 mmHg — AB (ref 32.0–48.0)
PH ART: 7.512 — AB (ref 7.350–7.450)
PO2 ART: 183 mmHg — AB (ref 83.0–108.0)
TCO2: 21 mmol/L (ref 0–100)

## 2016-04-12 LAB — OSMOLALITY: Osmolality: 292 mOsm/kg (ref 275–295)

## 2016-04-12 LAB — CREATININE, SERUM
Creatinine, Ser: 1.99 mg/dL — ABNORMAL HIGH (ref 0.61–1.24)
GFR calc non Af Amer: 35 mL/min — ABNORMAL LOW (ref >60)
GFR, EST AFRICAN AMERICAN: 41 mL/min — AB (ref >60)

## 2016-04-12 LAB — DIFFERENTIAL
BASOS ABS: 0 10*3/uL (ref 0.0–0.1)
Basophils Relative: 0 %
EOS ABS: 0 10*3/uL (ref 0.0–0.7)
EOS PCT: 0 %
LYMPHS ABS: 0.8 10*3/uL (ref 0.7–4.0)
Lymphocytes Relative: 10 %
MONOS PCT: 9 %
Monocytes Absolute: 0.7 10*3/uL (ref 0.1–1.0)
Neutro Abs: 7 10*3/uL (ref 1.7–7.7)
Neutrophils Relative %: 81 %

## 2016-04-12 LAB — I-STAT TROPONIN, ED: TROPONIN I, POC: 0.75 ng/mL — AB (ref 0.00–0.08)

## 2016-04-12 LAB — PROTIME-INR
INR: 1.02
PROTHROMBIN TIME: 13.4 s (ref 11.4–15.2)

## 2016-04-12 LAB — PROTEIN AND GLUCOSE, CSF
Glucose, CSF: 88 mg/dL — ABNORMAL HIGH (ref 40–70)
TOTAL PROTEIN, CSF: 38 mg/dL (ref 15–45)

## 2016-04-12 LAB — CK
CK TOTAL: 42653 U/L — AB (ref 49–397)
Total CK: 39251 U/L — ABNORMAL HIGH (ref 49–397)
Total CK: 40016 U/L — ABNORMAL HIGH (ref 49–397)

## 2016-04-12 LAB — MRSA PCR SCREENING: MRSA by PCR: NEGATIVE

## 2016-04-12 LAB — CBG MONITORING, ED: GLUCOSE-CAPILLARY: 142 mg/dL — AB (ref 65–99)

## 2016-04-12 LAB — LIPASE, BLOOD: Lipase: 22 U/L (ref 11–51)

## 2016-04-12 LAB — ETHANOL: Alcohol, Ethyl (B): <5 mg/dL (ref <5)

## 2016-04-12 LAB — TRIGLYCERIDES: Triglycerides: 150 mg/dL — ABNORMAL HIGH (ref <150)

## 2016-04-12 MED ORDER — DEXMEDETOMIDINE HCL IN NACL 200 MCG/50ML IV SOLN: 0.0000 ug/kg/h | INTRAVENOUS | Status: DC

## 2016-04-12 MED ORDER — DEXTROSE 5 % IV SOLN
2.0000 g | Freq: Once | INTRAVENOUS | Status: AC
  Administered 2016-04-12: 2 g via INTRAVENOUS
  Filled 2016-04-12: qty 2

## 2016-04-12 MED ORDER — VANCOMYCIN HCL IN DEXTROSE 750-5 MG/150ML-% IV SOLN
750.0000 mg | Freq: Two times a day (BID) | INTRAVENOUS | Status: DC
  Administered 2016-04-12 – 2016-04-14 (×4): 750 mg via INTRAVENOUS
  Filled 2016-04-12 (×5): qty 150

## 2016-04-12 MED ORDER — ETOMIDATE 2 MG/ML IV SOLN
20.0000 mg | Freq: Once | INTRAVENOUS | Status: AC
  Administered 2016-04-12: 20 mg via INTRAVENOUS

## 2016-04-12 MED ORDER — DEXTROSE 5 % IV SOLN
900.0000 mg | Freq: Two times a day (BID) | INTRAVENOUS | Status: DC
  Administered 2016-04-12: 900 mg via INTRAVENOUS
  Filled 2016-04-12: qty 18

## 2016-04-12 MED ORDER — LEVOFLOXACIN IN D5W 750 MG/150ML IV SOLN
750.0000 mg | Freq: Once | INTRAVENOUS | Status: AC
  Administered 2016-04-12: 750 mg via INTRAVENOUS
  Filled 2016-04-12: qty 150

## 2016-04-12 MED ORDER — FENTANYL CITRATE (PF) 100 MCG/2ML IJ SOLN
100.0000 ug | INTRAMUSCULAR | Status: DC | PRN
  Administered 2016-04-12 – 2016-04-13 (×2): 100 ug via INTRAVENOUS
  Filled 2016-04-12 (×2): qty 2

## 2016-04-12 MED ORDER — VANCOMYCIN HCL 10 G IV SOLR
1500.0000 mg | Freq: Once | INTRAVENOUS | Status: AC
  Administered 2016-04-12: 1500 mg via INTRAVENOUS
  Filled 2016-04-12: qty 1500

## 2016-04-12 MED ORDER — SODIUM CHLORIDE 0.9 % IV SOLN: 250.0000 mL | INTRAVENOUS | Status: DC | PRN

## 2016-04-12 MED ORDER — DEXTROSE 5 % IV SOLN
1.0000 g | Freq: Three times a day (TID) | INTRAVENOUS | Status: DC
  Administered 2016-04-12: 1 g via INTRAVENOUS
  Filled 2016-04-12 (×2): qty 1

## 2016-04-12 MED ORDER — ADULT MULTIVITAMIN LIQUID CH
15.0000 mL | Freq: Every day | ORAL | Status: DC
  Administered 2016-04-12: 15 mL via ORAL
  Filled 2016-04-12 (×3): qty 15

## 2016-04-12 MED ORDER — FAMOTIDINE IN NACL 20-0.9 MG/50ML-% IV SOLN
20.0000 mg | Freq: Two times a day (BID) | INTRAVENOUS | Status: DC
  Administered 2016-04-12 – 2016-04-13 (×3): 20 mg via INTRAVENOUS
  Filled 2016-04-12 (×4): qty 50

## 2016-04-12 MED ORDER — ACETAMINOPHEN 650 MG RE SUPP
RECTAL | Status: AC
  Administered 2016-04-12: 11:00:00
  Filled 2016-04-12: qty 1

## 2016-04-12 MED ORDER — CHLORHEXIDINE GLUCONATE 0.12% ORAL RINSE (MEDLINE KIT)
15.0000 mL | Freq: Two times a day (BID) | OROMUCOSAL | Status: DC
  Administered 2016-04-12 – 2016-04-18 (×8): 15 mL via OROMUCOSAL

## 2016-04-12 MED ORDER — PROPOFOL 1000 MG/100ML IV EMUL
INTRAVENOUS | Status: AC
  Filled 2016-04-12: qty 100

## 2016-04-12 MED ORDER — HEPARIN SODIUM (PORCINE) 5000 UNIT/ML IJ SOLN
5000.0000 [IU] | Freq: Three times a day (TID) | INTRAMUSCULAR | Status: DC
  Administered 2016-04-12 – 2016-04-18 (×15): 5000 [IU] via SUBCUTANEOUS
  Filled 2016-04-12 (×18): qty 1

## 2016-04-12 MED ORDER — SODIUM CHLORIDE 0.9 % IV BOLUS (SEPSIS)
1000.0000 mL | Freq: Once | INTRAVENOUS | Status: AC
  Administered 2016-04-12: 1000 mL via INTRAVENOUS

## 2016-04-12 MED ORDER — VANCOMYCIN HCL IN DEXTROSE 1-5 GM/200ML-% IV SOLN: 1000.0000 mg | Freq: Once | INTRAVENOUS | Status: DC

## 2016-04-12 MED ORDER — THIAMINE HCL 100 MG/ML IJ SOLN
100.0000 mg | Freq: Every day | INTRAMUSCULAR | Status: DC
  Administered 2016-04-12 – 2016-04-14 (×3): 100 mg via INTRAVENOUS
  Filled 2016-04-12: qty 2
  Filled 2016-04-12 (×2): qty 1
  Filled 2016-04-12: qty 2

## 2016-04-12 MED ORDER — FENTANYL CITRATE (PF) 100 MCG/2ML IJ SOLN: 100.0000 ug | INTRAMUSCULAR | Status: DC | PRN

## 2016-04-12 MED ORDER — PROPOFOL 1000 MG/100ML IV EMUL
0.0000 ug/kg/min | INTRAVENOUS | Status: DC
  Administered 2016-04-12: 50 ug/kg/min via INTRAVENOUS
  Administered 2016-04-12: 40 ug/kg/min via INTRAVENOUS
  Administered 2016-04-12: 50 ug/kg/min via INTRAVENOUS
  Administered 2016-04-13: 40 ug/kg/min via INTRAVENOUS
  Administered 2016-04-13: 30 ug/kg/min via INTRAVENOUS
  Filled 2016-04-12 (×4): qty 100

## 2016-04-12 MED ORDER — SODIUM BICARBONATE 8.4 % IV SOLN
INTRAVENOUS | Status: DC
  Administered 2016-04-12 – 2016-04-13 (×2): via INTRAVENOUS
  Filled 2016-04-12 (×5): qty 150

## 2016-04-12 MED ORDER — LEVOFLOXACIN IN D5W 750 MG/150ML IV SOLN
750.0000 mg | INTRAVENOUS | Status: DC
  Administered 2016-04-14: 750 mg via INTRAVENOUS
  Filled 2016-04-12 (×2): qty 150

## 2016-04-12 MED ORDER — LORAZEPAM 2 MG/ML IJ SOLN
INTRAMUSCULAR | Status: AC
  Administered 2016-04-12: 11:00:00
  Filled 2016-04-12: qty 1

## 2016-04-12 MED ORDER — SODIUM CHLORIDE 0.9 % IV SOLN
INTRAVENOUS | Status: DC
  Administered 2016-04-12: 15:00:00 via INTRAVENOUS

## 2016-04-12 MED ORDER — PROPOFOL 1000 MG/100ML IV EMUL
INTRAVENOUS | Status: AC
  Administered 2016-04-12: 40 ug/kg/min
  Administered 2016-04-12: 10 ug/kg/min
  Filled 2016-04-12: qty 100

## 2016-04-12 MED ORDER — SUCCINYLCHOLINE CHLORIDE 20 MG/ML IJ SOLN
100.0000 mg | Freq: Once | INTRAMUSCULAR | Status: AC
  Administered 2016-04-12: 100 mg via INTRAVENOUS

## 2016-04-12 MED ORDER — LORAZEPAM 2 MG/ML IJ SOLN
INTRAMUSCULAR | Status: AC
  Administered 2016-04-12: 18:00:00
  Filled 2016-04-12: qty 1

## 2016-04-12 MED ORDER — ORAL CARE MOUTH RINSE
15.0000 mL | Freq: Four times a day (QID) | OROMUCOSAL | Status: DC
  Administered 2016-04-13 – 2016-04-17 (×11): 15 mL via OROMUCOSAL

## 2016-04-12 MED ORDER — FOLIC ACID 5 MG/ML IJ SOLN
1.0000 mg | Freq: Every day | INTRAMUSCULAR | Status: DC
  Administered 2016-04-12 – 2016-04-14 (×3): 1 mg via INTRAVENOUS
  Filled 2016-04-12 (×4): qty 0.2

## 2016-04-12 NOTE — ED Notes (Signed)
Patient transported to CT with nurse and RT 

## 2016-04-12 NOTE — Procedures (Signed)
New arrival from [facility], placed pt on vent at this time.

## 2016-04-12 NOTE — ED Notes (Signed)
MD at bedside. 

## 2016-04-12 NOTE — Progress Notes (Signed)
Pharmacy Antibiotic Note Tony RutterRobert H Patterson is a 60 y.o. male admitted on 04/12/2016 with fever and seizures.  Currently patient is on Levaquin and vancomycin. S/p LP by neurology and pharmacy asked to add acyclovir to current treatment regimen.   Plan: - Begin Acyclovir 900 mg IV every 12 hours based on current renal function   Height: 6\' 3"  (190.5 cm) Weight: 204 lb (92.5 kg) IBW/kg (Calculated) : 84.5  Temp (24hrs), Avg:101.6 F (38.7 C), Min:99 F (37.2 C), Max:102.8 F (39.3 C)   Recent Labs Lab 04/12/16 1051 04/12/16 1102 04/12/16 1536  WBC 10.3  --  8.4  CREATININE 2.04*  --  1.99*  LATICACIDVEN  --  3.54* 1.7    Estimated Creatinine Clearance: 47.8 mL/min (by C-G formula based on SCr of 1.99 mg/dL (H)).    Allergies  Allergen Reactions  . Bee Venom Anaphylaxis  . Penicillins     Reaction: unknown, childhood allergy   Antimicrobials this admission:  11/10 levofloxacin  >>  11/10 vancomycin >>  11/10 acyclovir >>   Dose adjustments this admission:  n/a  Microbiology results:  11/10 BCx: px 11/10 UCx:  px 11/10 Sputum: px   11/10 CSF px    Thank you for allowing pharmacy to be a part of this patient's care.  Pollyann SamplesAndy Felix Meras, PharmD, BCPS 04/12/2016, 6:33 PM Pager: [phone]

## 2016-04-12 NOTE — ED Triage Notes (Signed)
Per EMS: Pt with hx of seizure, hasnt had seizure in two years, unwitnessed seizure this morning.  Pt nephew came into his room to find post-ictal. Pt still post-ictal at this time, snoring respirations for EMS, none at this time.  Pt is bleeding from mouth and left hand.  Pt was laying on right side on the floor.  Pt nephew says he was not as responsive yesterday.  Pt nephew states he takes seizure medication daily.

## 2016-04-12 NOTE — Consult Note (Signed)
Neurology Consultation Reason for Consult: Febrile altered mental status Referring Physician: Molli KnockYacoub,   CC: Altered mental status  History is obtained from: Family  HPI: Tony Patterson is a 60 y.o. male who was found down today after being confused. He had a single seizure 4 years ago,? Related to withdrawal. His family describes that yesterday, he was confused and walking around with a blank stare on his face. Today when his nephew went over to check on him, he found him down and unresponsive. He was brought into the emergency department where he had multiple further seizures. He was intubated.  His family febrile up to 104 and was therefore started on antibiotics including vancomycin, Levaquin, aztreonam(PCN allergy). He was there transferred to [facility] for further evaluation.  Of note, he typically drinks at least multiple times per week, family is not certain if he drinks every day. He has not smoked any alcohol on him in the past 3 days.    RUE:[id number]:Unable to obtain due to altered mental status.   Past Medical History:  Diagnosis Date  . Alcohol abuse   . Anemia   . Anxiety   . Hypertension   . Neuropathy (HCC)   . Seizures (HCC)    unknown etiology and on no meds; been 4 years since seizure.     Family History  Problem Relation Age of Onset  . Fibromyalgia Mother   . Dementia Mother   . Congestive Heart Failure Mother   . Congestive Heart Failure Father   . Heart Problems Father   . Hypertension Brother   . Hypertension Brother   . Neuropathy Neg Hx   . Colon cancer Neg Hx      Social History:  reports that he quit smoking about 20 years ago. His smoking use included Cigarettes. He has a 10.00 pack-year smoking history. He has never used smokeless tobacco. He reports that he drinks about 18.0 oz of alcohol per week . He reports that he does not use drugs.   Exam: Current vital signs: BP (!) 147/106   Pulse (!) 101   Temp 101.3 F (38.5 C)   Resp 10   Ht 6\' 3"   (1.905 m)   Wt 92.5 kg (204 lb)   SpO2 100%   BMI 25.50 kg/m  Vital signs in last 24 hours: Temp:  [99 F (37.2 C)-102.8 F (39.3 C)] 101.3 F (38.5 C) (11/10 1415) Pulse Rate:  [101-129] 101 (11/10 1800) Resp:  [10-26] 10 (11/10 1800) BP: (106-194)/(82-156) 147/106 (11/10 1800) SpO2:  [89 %-100 %] 100 % (11/10 1819) FiO2 (%):  [50 %-100 %] 50 % (11/10 1528) Weight:  [92.5 kg (204 lb)] 92.5 kg (204 lb) (11/10 1024)   Physical Exam  Constitutional: Appears well-developed and well-nourished.  Psych: Unresponsive Eyes: No scleral injection HENT: ET tube in place Head: Normocephalic.  Cardiovascular: Normal rate and regular rhythm.  Respiratory: Ventilated GI: Soft.  No distension. There is no tenderness.  Skin: WDI  Neuro: Mental Status: Patient is sedated, but still opens eyes partially and moves both sides purposefully he does not follow commands Cranial Nerves: II: Does not blink to threat Pupils are equal, round, and reactive to light.   III,IV, VI: Eyes are midline, doll's eye intact V:VII: Blinks to eyelid stimulation bilaterally VIII, X, XI, XII: Unable to assess secondary to patient's altered mental status.  Motor: He moves bilateral arms and legs purposefully and noxious stimulation Sensory: Response to noxious stimuli in all 4 extremities Cerebellar: Does  not perform  I have reviewed labs in epic and the results pertinent to this consultation are: Elevated creatinine at 2.04 Elevated AST and healthy CK-markedly elevated at 39,000  I have reviewed the images obtained: CT head-unremarkable  Impression: 60 year old male found down with altered mental status, high fever, new onset seizures. Possibilities include CNS infection such as herpes, withdrawal seizures leading to status epilepticus, other etiology for him being down such as drug abuse with subsequent rhabdomyolysis and metabolic abnormalities leading seizures. He is being evaluated with stat EEG and I  think he needs a lumbar puncture to rule out CNS infection. Unclear if there is any significance of his dog being diagnosed with Lyme disease.  Recommendations: 1) LP for cells, glucose, protein, HSV, Lyme DNA 2) empiric antibiotics including acyclovir 3) appreciate ID assistance 4) neurology will follow.   Ritta SlotMcNeill Daxson Reffett, MD [facility] [phone]  If 7pm- 7am, please page neurology on call as listed in AMION.

## 2016-04-12 NOTE — ED Provider Notes (Signed)
[facility]-EMERGENCY DEPT Provider Note   CSN: [id number] Arrival date & time: 04/12/16  1021  By signing my name below, I, Soijett Blue, attest that this documentation has been prepared under the direction and in the presence of Heide Scales, MD. Electronically Signed: Soijett Blue, ED Scribe. 04/12/16. 10:55 AM.   History   Chief Complaint Chief Complaint  Patient presents with  . Seizures     LEVEL 5 CAVEAT: ACUITY OF CONDITION  HPI  Tony Patterson is a 60 y.o. male with a PMHx of seizures, alcohol abuse, HTN, who presents to the Emergency Department brought in by EMS complaining of seizures onset PTA. Pt nephew states that the pt hasn't had a seizure in over 2 years and had a witnessed seizure this morning. Pt was found down. Pt nephew denies the pt taking any seizure medications at this time. EMS reports that upon arrival the pt had snoring respirations and was laying on his right side. Pt nephew noted that the pt wasn't at his baseline yesterday and he was not as responsive to questions. Pt nephew also notes that the pt recently put his dog down 4 days ago due to it having lyme dx. Nephew reports that the pt was evaluated for flu last week with symptoms similar to a "stomach bug" and it was found that he didn't have flu. Pt nephew is unsure if the pt consumed ETOH this week, but notes that the pt is able to hide his ETOH use. Family denies the pt having difficulty urinating, cough, rash, and any other symptoms. Family denies the pt having a hx of stroke.    Patient is unable to answer any questions. History is completely provided by EMS and family.    The history is provided by the EMS personnel and a relative (pt nephew). The history is limited by the condition of the patient. No language interpreter was used.  Seizures   This is a recurrent problem. The current episode started less than 1 hour ago. The problem has not changed since onset.Number of times: unsure, one witnessed  by [facility] and EMS. Associated symptoms include confusion. Characteristics include rhythmic jerking and bit tongue. The episode was witnessed. The seizure(s) had no focality. Possible causes include recent illness (recent viral infection per family). The maximum temperature recorded prior to his arrival was 102 to 102.9 F. There were no medications administered prior to arrival.    Past Medical History:  Diagnosis Date  . Alcohol abuse   . Anemia   . Anxiety   . Hypertension   . Neuropathy (HCC)   . Seizures (HCC)    unknown etiology and on no meds; been 4 years since seizure.    Patient Active Problem List   Diagnosis Date Noted  . Neuropathy, alcoholic (HCC) 02/22/2016  . History of adenomatous polyp of colon 03/10/2015  . Alcohol abuse, continuous 03/07/2014  . Hereditary and idiopathic peripheral neuropathy 03/07/2014    Past Surgical History:  Procedure Laterality Date  . COLONOSCOPY  05/18/2007   NWG:[id number] hemorrhoids, a single anal papilla, otherwise normal/ Single polyp, as described above, removed   . COLONOSCOPY WITH PROPOFOL N/A 03/16/2015   Procedure: COLONOSCOPY WITH PROPOFOL;  Surgeon: Corbin Ade, MD;  Location: [facility];  Service: Endoscopy;  Laterality: N/A;  in cecum at 1344. withdrawal time   . THROAT SURGERY     polyps removed x2       Home Medications    Prior to Admission medications  Medication Sig Start Date End Date Taking? Authorizing Provider  Atorvastatin Calcium (LIPITOR PO) Take by mouth.    Historical Provider, MD  b complex vitamins capsule Take 1 capsule by mouth daily.    Historical Provider, MD  CILOSTAZOL PO Take by mouth.    Historical Provider, MD  gabapentin (NEURONTIN) 600 MG tablet Take 1 tablet (600 mg total) by mouth 3 (three) times daily. 02/01/16   Anson Fret, MD  Gabapentin, Once-Daily, 300 MG TABS Take 900 mg by mouth 2 (two) times daily as needed. 01/25/16   Anson Fret, MD  magnesium oxide (MAG-OX) 400 MG  tablet Take 400 mg by mouth at bedtime.    Historical Provider, MD  metoprolol (LOPRESSOR) 50 MG tablet Take 50 mg by mouth 2 (two) times daily. For systolic blood pressure above 90 10/09/11   Carylon Perches, MD  omeprazole (PRILOSEC) 20 MG capsule Take 20 mg by mouth every morning.    Historical Provider, MD  tamsulosin (FLOMAX) 0.4 MG CAPS capsule Take 0.4 mg by mouth daily. 02/28/14   Historical Provider, MD    Family History Family History  Problem Relation Age of Onset  . Fibromyalgia Mother   . Dementia Mother   . Congestive Heart Failure Mother   . Congestive Heart Failure Father   . Heart Problems Father   . Hypertension Brother   . Hypertension Brother   . Neuropathy Neg Hx   . Colon cancer Neg Hx     Social History Social History  Substance Use Topics  . Smoking status: Former Smoker    Packs/day: 0.50    Years: 20.00    Types: Cigarettes    Quit date: 06/03/1995  . Smokeless tobacco: Never Used  . Alcohol use 18.0 oz/week    30 Shots of liquor per week     Comment: daily liquor (about 2 drinks most evenings); beer occasionally; Previously drank more.     Allergies   Bee venom and Penicillins   Review of Systems Review of Systems  Unable to perform ROS: Acuity of condition  Constitutional: Positive for fever.  Neurological: Positive for seizures.  Psychiatric/Behavioral: Positive for confusion.     Physical Exam Updated Vital Signs BP (!) 184/130 (BP Location: Left Arm)   Pulse (!) 129   Temp 102.8 F (39.3 C) (Rectal)   Resp 19   Ht 6\' 3"  (1.905 m)   Wt 204 lb (92.5 kg)   SpO2 97%   BMI 25.50 kg/m   Physical Exam  Constitutional: He appears well-developed. He appears distressed.  HENT:  Head: Normocephalic and atraumatic.  Nose: Nose normal.  Dried blood on mouth and tongue. No laceration seen.    Eyes: Conjunctivae are normal. Pupils are equal, round, and reactive to light. Right eye exhibits no discharge. Left eye exhibits no discharge. No  scleral icterus.  Neck:  Pt did not grimace with neck movement.    Cardiovascular: Regular rhythm and intact distal pulses.  Tachycardia present.   No murmur heard. Pulmonary/Chest: No stridor. Tachypnea noted. No respiratory distress. He has no wheezes. He has rhonchi. He has no rales. He exhibits no tenderness.  PT had coarse breath sounds and transmitted upper airway sounds. Pt had snoring breathing.    Abdominal: Bowel sounds are normal. He exhibits no distension. There is no tenderness.  Musculoskeletal: He exhibits no tenderness.  Neurological: He is unresponsive. He displays abnormal reflex. No sensory deficit. He exhibits abnormal muscle tone. GCS eye subscore is 4. GCS  verbal subscore is 2. GCS motor subscore is 4.  Patient was hyperreflexic in legs.   Patient had seizure witnessed shortly after initial eval. Patient subsequently had posturing with all extremities. Patient's pupils were 4 mm and reactive bilaterally. Patient did not have a gaze preference.   Skin: Skin is warm. Capillary refill takes less than 2 seconds. No rash noted. He is diaphoretic.  Nursing note and vitals reviewed.   ED Treatments / Results  DIAGNOSTIC STUDIES: Oxygen Saturation is 97% on RA, nl by my interpretation.    COORDINATION OF CARE: 10:45 AM Discussed treatment plan with pt family at bedside which includes labs, CT head, EKG, CXR, UA, and pt family agreed to plan.   Labs (all labs ordered are listed, but only abnormal results are displayed) Labs Reviewed  COMPREHENSIVE METABOLIC PANEL - Abnormal; Notable for the following:       Result Value   Chloride 99 (*)    Glucose, Bld 136 (*)    Creatinine, Ser 2.04 (*)    AST 344 (*)    ALT 68 (*)    Total Bilirubin 2.1 (*)    GFR calc non Af Amer 34 (*)    GFR calc Af Amer 39 (*)    Anion gap 17 (*)    All other components within normal limits  CBC WITH DIFFERENTIAL/PLATELET - Abnormal; Notable for the following:    RDW 16.5 (*)    Neutro  Abs 8.9 (*)    Lymphs Abs 0.6 (*)    All other components within normal limits  URINALYSIS, ROUTINE W REFLEX MICROSCOPIC (NOT AT [facility]) - Abnormal; Notable for the following:    APPearance HAZY (*)    Hgb urine dipstick LARGE (*)    Bilirubin Urine MODERATE (*)    Ketones, ur 15 (*)    Protein, ur >300 (*)    All other components within normal limits  BRAIN NATRIURETIC PEPTIDE - Abnormal; Notable for the following:    B Natriuretic Peptide 610.0 (*)    All other components within normal limits  BLOOD GAS, VENOUS - Abnormal; Notable for the following:    pH, Ven 7.475 (*)    pCO2, Ven 1.9 (*)    pO2, Ven 96.3 (*)    All other components within normal limits  CK - Abnormal; Notable for the following:    Total CK 39,251 (*)    All other components within normal limits  URINE MICROSCOPIC-ADD ON - Abnormal; Notable for the following:    Bacteria, UA FEW (*)    All other components within normal limits  BLOOD GAS, ARTERIAL - Abnormal; Notable for the following:    pO2, Arterial 337 (*)    All other components within normal limits  CBC - Abnormal; Notable for the following:    RBC 4.02 (*)    Hemoglobin 11.6 (*)    HCT 36.0 (*)    RDW 16.5 (*)    Platelets 146 (*)    All other components within normal limits  I-STAT CG4 LACTIC ACID, ED - Abnormal; Notable for the following:    Lactic Acid, Venous 3.54 (*)    All other components within normal limits  CBG MONITORING, ED - Abnormal; Notable for the following:    Glucose-Capillary 142 (*)    All other components within normal limits  CULTURE, BLOOD (ROUTINE X 2)  CULTURE, BLOOD (ROUTINE X 2)  URINE CULTURE  LIPASE, BLOOD  PROTIME-INR  ROCKY MTN SPOTTED FVR ABS PNL(IGG+IGM)  B.  BURGDORFI ANTIBODIES  LYME DISEASE DNA BY PCR(BORRELIA BURG)  CREATININE, SERUM  CK  LACTIC ACID, PLASMA  LACTIC ACID, PLASMA  TROPONIN I  RAPID URINE DRUG SCREEN, HOSP PERFORMED  ETHANOL  TRIGLYCERIDES  DIFFERENTIAL  I-STAT TROPOININ, ED  I-STAT  CG4 LACTIC ACID, ED    EKG  EKG Interpretation None       Radiology Ct Head Wo Contrast  Result Date: 04/12/2016 CLINICAL DATA:  Unwitnessed seizure today. History of seizure disorder, but no seizure in 2 years. Fever, tachycardia and intubation. EXAM: CT HEAD WITHOUT CONTRAST TECHNIQUE: Contiguous axial images were obtained from the base of the skull through the vertex without intravenous contrast. COMPARISON:  CT head 11/02/2011. FINDINGS: Brain: There is no evidence of acute intracranial hemorrhage, mass lesion, brain edema or extra-axial fluid collection. Stable mild generalized atrophy. There is no CT evidence of acute cortical infarction. Vascular: Intracranial vascular calcifications are present. Skull: Negative for fracture or focal lesion. Sinuses/Orbits: Patient is intubated. There is mild mucosal thickening in the ethmoid sinuses. There is a small amount of fluid dependently in the sphenoid sinus. The frontal and maxillary sinuses are clear. The mastoid air cells and middle ears are clear. No orbital abnormalities are seen. Other: None. IMPRESSION: 1. No acute intracranial findings. 2. Mild paranasal sinus mucosal thickening and fluid attributed to intubation. Electronically Signed   By: Carey BullocksWilliam  Veazey M.D.   On: 04/12/2016 12:30   Dg Chest Port 1 View  Result Date: 04/12/2016 CLINICAL DATA:  Fever and tachycardia EXAM: PORTABLE CHEST 1 VIEW COMPARISON:  December 11, 2015 FINDINGS: There is no edema or consolidation. The heart size and pulmonary vascularity are normal. No adenopathy. There old healed rib fractures on the right. IMPRESSION: No edema or consolidation. Electronically Signed   By: Bretta BangWilliam  Woodruff III M.D.   On: 04/12/2016 11:18   Dg Chest Port 1v Same Day  Result Date: 04/12/2016 CLINICAL DATA:  Intubation and nasal/ orogastric tube placement. EXAM: PORTABLE CHEST 1 VIEW COMPARISON:  04/12/2016 FINDINGS: Endotracheal tube tip projects 4 cm above the Carina.  Nasal/orogastric tube passes well below the diaphragm into the mid stomach. No other change from the prior study. No acute findings in the lungs. IMPRESSION: 1. Endotracheal tube tip projects 4 cm above the Carina. 2. Nasal/orogastric tube is well positioned passing well into the stomach. Electronically Signed   By: Amie Portlandavid  Ormond M.D.   On: 04/12/2016 11:42    Procedures Procedure Name: Intubation Date/Time: 04/12/2016 4:06 PM Performed by: Heide ScalesEGELER, CHRISTOPHER J Pre-anesthesia Checklist: Patient identified, Emergency Drugs available, Suction available, Timeout performed and Patient being monitored Oxygen Delivery Method: Non-rebreather mask Preoxygenation: Pre-oxygenation with 100% oxygen Intubation Type: Rapid sequence Ventilation: Mask ventilation without difficulty Laryngoscope Size: Glidescope Grade View: Grade I Tube size: 7.5 mm Number of attempts: 1 Placement Confirmation: ETT inserted through vocal cords under direct vision,  CO2 detector and Breath sounds checked- equal and bilateral Secured at: 26 cm Tube secured with: ETT holder      (including critical care time)   CRITICAL CARE Performed by: Canary Brimhristopher J Tegeler Total critical care time: 60 minutes Critical care time was exclusive of separately billable procedures and treating other patients. Critical care was necessary to treat or prevent imminent or life-threatening deterioration. Critical care was time spent personally by me on the following activities: development of treatment plan with patient and/or surrogate as well as nursing, discussions with consultants, evaluation of patient's response to treatment, examination of patient, obtaining history from patient or surrogate,  ordering and performing treatments and interventions, ordering and review of laboratory studies, ordering and review of radiographic studies, pulse oximetry and re-evaluation of patient's condition.     Medications Ordered in ED Medications    vancomycin (VANCOCIN) IVPB 750 mg/150 ml premix (not administered)  aztreonam (AZACTAM) 1 g in dextrose 5 % 50 mL IVPB (not administered)  levofloxacin (LEVAQUIN) IVPB 750 mg (not administered)  propofol (DIPRIVAN) 1000 MG/100ML infusion (  Transfusing/Transfer 04/12/16 1417)  0.9 %  sodium chloride infusion (not administered)  heparin injection 5,000 Units (not administered)  famotidine (PEPCID) IVPB 20 mg premix (not administered)  0.9 %  sodium chloride infusion (not administered)  fentaNYL (SUBLIMAZE) injection 100 mcg (not administered)  fentaNYL (SUBLIMAZE) injection 100 mcg (not administered)  propofol (DIPRIVAN) 1000 MG/100ML infusion (not administered)  sodium chloride 0.9 % bolus 1,000 mL (1,000 mLs Intravenous Transfusing/Transfer 04/12/16 1400)    And  sodium chloride 0.9 % bolus 1,000 mL (1,000 mLs Intravenous Transfusing/Transfer 04/12/16 1401)    And  sodium chloride 0.9 % bolus 1,000 mL (1,000 mLs Intravenous New Bag/Given 04/12/16 1200)  levofloxacin (LEVAQUIN) IVPB 750 mg (0 mg Intravenous Stopped 04/12/16 1338)  aztreonam (AZACTAM) 2 g in dextrose 5 % 50 mL IVPB (0 g Intravenous Stopped 04/12/16 1338)  vancomycin (VANCOCIN) 1,500 mg in sodium chloride 0.9 % 500 mL IVPB (0 mg Intravenous Stopped 04/12/16 1339)  LORazepam (ATIVAN) 2 MG/ML injection (  Given 04/12/16 1100)  acetaminophen (TYLENOL) 650 MG suppository (  Given 04/12/16 1115)  etomidate (AMIDATE) injection 20 mg (20 mg Intravenous Given 04/12/16 1119)  succinylcholine (ANECTINE) injection 100 mg (100 mg Intravenous Given 04/12/16 1119)  propofol (DIPRIVAN) 1000 MG/100ML infusion (  Transfusing/Transfer 04/12/16 1400)     Initial Impression / Assessment and Plan / ED Course  I have reviewed the triage vital signs and the nursing notes.  Pertinent labs & imaging results that were available during my care of the patient were reviewed by me and considered in my medical decision making (see chart for  details).  Clinical Course as of Apr 14 831  Caleen Essex Apr 12, 2016  1608 CK Total: Marland Kitchen 39,251 [CT]    Clinical Course User Index [CT] Canary Brim Tegeler, MD    VERLYN LAMBERT is a 60 y.o. male with a past medical history significant for epilepsy and prior alcohol abuse who presents with altered mental status, seizure, and fever.  Upon arrival, patient assessed and was febrile, tachycardic, hypertensive, tachypnea, and altered. Patient was clinically encephalopathic. He was not following any commands. He was however moving all extremities.  On other exam, patient did not have any visible rashes. Upper airway sounds were transmitted but no wheezing in the lungs. Patient had snoring type breathing as he is likely postictal on arrival. Abdomen was nontender. Skin exam did not reveal any evidence of cellulitis. Patient's neck was flexed and extended and he did not appear to grimace.   Code sepsis was called due to altered mental status, fever, recent seizure, and vital signs. Patient given fluids and broad-spectrum antibiotics were ordered.  Shin then had another seizure, it was witnessed by me and was generalized and shaking. Ativan was ordered. Patient stopped seizing. Patient returned to snoring type breathing but then began posturing. Patient was posturing with all extremities.  Due to worsening mental status and concern for aspiration, patient was intubated. Patient was intubated with RSI as described above. Post intubation x-ray confirmed tube placement. Patient was started on propofol and did not  have any further seizures or agitation.  Vertical care was called and patient will be admitted to ICU at [facility] for further management. Initially, there was going to be a delay with transfer due to availability of critical care transport. ICU team requested patient be brought to them as quickly as possible. Solution was determined and ED nurse will accompany EMS crew and transfer patient on a  normal truck. EMS supervisor spoke with charge nurse and agreed with this plan.  Patient symptoms, multiple etiologies are considered. Patient may have had breakthrough seizure in setting of action. He has been exposed to a tick that was carrying Lyme disease due to his dog being put down. Lyme and RMSF tests were sent.   CT of the head was completed and showed no acute intracranial abnormalities. Specifically, no hemorrhage or other space-occupying lesions.   Meningitis or encephalitis is still on differential. Due to patient being agitated, posturing, and not protecting his airway, did not perform lumbar puncture during initial workup. Did not want to delay antibiotics while awaiting lumbar puncture. Broad-spectrum antibiotics, cultures obtained and fluids were given without lumbar puncture. Patient will likely need LP during his admission. Did not feel patient was safe for lumbar puncture during initial workup. Feel Etoh withdrawal seizures also might be possibility  Patient had improvement in his blood pressure on propofol. Patient had improvement in heart rate after fluids. When patient initially had fever, Tylenol was given. Patient continued to have some fever.    Patient transported without further complication to [facility] for ICU management. Family was informed of all workup decisions, findings of workup, and plan to transport. We agreed with plan of management and agreed with plan to send patient with ED nurse on regular EMS vehicle. Family had no other questions and patient was transferred in serious condition with stable vital signs.      Final Clinical Impressions(s) / ED Diagnoses   Final diagnoses:  Altered mental status, unspecified altered mental status type  Sepsis, due to unspecified organism [facility])  Seizure (HCC)     I personally performed the services described in this documentation, which was scribed in my presence. The recorded information has been reviewed and is  accurate.   Clinical Impression: 1. Altered mental status, unspecified altered mental status type   2. Endotracheally intubated   3. Sepsis, due to unspecified organism (HCC)   4. Seizure [facility])     Disposition: Transfer and Admit to [facility] ICU      Heide Scales, MD 04/13/16 [phone]

## 2016-04-12 NOTE — ED Notes (Addendum)
Patient transported via [facility] traffic to [facility]. Paramedic Young in attendance. Patient remained stable throughout transport on rescue ventilator with FiO2 50%, RR12, PEEP 5 and TV 600. CO2 ranged from 27-35 throughout transport. ETT stable and 24 at lip. Propofol drip increased to 55mcg for restlessness at 1455. New vial of Propofol 100ml hung at 1455 on pump at 7255mcg/min.  HR 110-118 throughout transport. Unable to obtain accurate O2 sats due to wide variation of readings and no HR correlation. NG tube to low suction and IV NS infusing inL AC at 125ml.hr. Patient passed a small amount of liquid dark stool at 1500. Bedside report given to Synetta FailAnita RN at bedside at 1510.

## 2016-04-12 NOTE — Procedures (Signed)
Indication: Altered mental status, fever  Risks of the procedure were dicussed with the patient including post-LP headache, bleeding, infection, weakness/numbness of legs(radiculopathy).  The patient/patient's proxy agreed and written consent was obtained.   The patient was prepped and draped, and using sterile technique a 20 gauge quinke spinal needle was inserted in the L 45 space. Despite multiple attempts, bony resistance was met each time. The needle was then placed at the L3-4 space and again bony resistance was met. Finally at L5-S1, on the second pass CSF was obtained that was blood-tinged but cleared quickly. The opening pressure was 14 mL H2O. Approximately 10 cc of CSF were obtained and sent for analysis.    Roland Rack, MD [facility] [phone]  If 7pm- 7am, please page neurology on call as listed in [place].

## 2016-04-12 NOTE — Progress Notes (Signed)
EEG Completed; Results Pending  

## 2016-04-12 NOTE — ED Notes (Signed)
Called [facility] to let them know that [facility] left about 1439 [facility].  Spoke to [employer] FoodPhil.

## 2016-04-12 NOTE — ED Notes (Signed)
CRITICAL VALUE ALERT  Critical value received:  pH 4.475, pCO2 29.7, pO2 149, Bicarb 24, venous sat 99, HgB .8  Date of notification:  04/12/16  Time of notification:  1155  Critical value read back:Yes.    Nurse who received alert:  Tilman NeatJennifer Kendrick,RN  MD notified (1st page):  Dr. Rush Landmarkegeler  Time of first page:  1155  MD notified (2nd page):  Time of second page:  Responding MD:  Dr. Rush Landmarkegeler  Time MD responded:  1155

## 2016-04-12 NOTE — Progress Notes (Signed)
eLink Physician-Brief Progress Note Patient Name: Tony RutterRobert H Patterson DOB: April 15, 1956 MRN: [id number]   Date of Service  04/12/2016  HPI/Events of Note  ABG reviewed - resp alkalosis Troponin 0.97, elevated CKs. Previous EKG with no ischemia  eICU Interventions  Reduce RR on vent from 14 to 10 Recheck EKG, follow troponin, CK     Intervention Category Evaluation Type: Other  Tony Patterson 04/12/2016, 6:19 PM

## 2016-04-12 NOTE — Progress Notes (Signed)
CRITICAL VALUE ALERT  Critical value received:  Troponin 0.96  Date of notification:  04/12/16  Time of notification:  1653  Critical value read back:Yes.    Nurse who received alert:  Tory EmeraldMichelle Diontae Route, Theola SequinrN  MD notified (1st page): Pola CornELINK MD  Time of first page:  1653  MD notified (2nd page):  Time of second page:  Responding MD:  Pola CornELINK MD  Time MD responded: [phone]

## 2016-04-12 NOTE — Progress Notes (Signed)
Pharmacy Antibiotic Note  Tony RutterRobert H Patterson is a 60 y.o. male admitted on 04/12/2016 with sepsis.  Pharmacy has been consulted for vancomycin, aztreonam and levaquin dosing. Initial doses ordered in the ED.  Plan: Change vanc to 1500 mg IV X 1 then 750 mg IV q12 hours Cont aztreonam 1gm IV q8 hours Cont levaquin 750 mg IV q48 hours F/u renal function, cultures and clinical course  Height: 6\' 3"  (190.5 cm) Weight: 204 lb (92.5 kg) IBW/kg (Calculated) : 84.5  Temp (24hrs), Avg:102.8 F (39.3 C), Min:102.8 F (39.3 C), Max:102.8 F (39.3 C)  No results for input(s): WBC, CREATININE, LATICACIDVEN, VANCOTROUGH, VANCOPEAK, VANCORANDOM, GENTTROUGH, GENTPEAK, GENTRANDOM, TOBRATROUGH, TOBRAPEAK, TOBRARND, AMIKACINPEAK, AMIKACINTROU, AMIKACIN in the last 168 hours.  CrCl cannot be calculated (Patient's most recent lab result is older than the maximum 21 days allowed.).    Allergies  Allergen Reactions  . Bee Venom Anaphylaxis  . Penicillins     Reaction: unknown, childhood allergy    Antimicrobials this admission: vanc 11/10 >>  aztreonam 11/10 >>  levaquin  11/10>>  Dose adjustments this admission:   Microbiology results: 11/10 BCx:  11/10 UCx:    Thank you for allowing pharmacy to be a part of this patient's care.  Talbert CageSeay, Keora Eccleston Poteet 04/12/2016 10:59 AM

## 2016-04-12 NOTE — H&P (Signed)
PULMONARY / CRITICAL CARE MEDICINE   Name: Tony Patterson MRN: [id number] DOB: Aug 30, 1955    ADMISSION DATE:  04/12/2016 CONSULTATION DATE:  04/22/16  REFERRING MD:  Dr. Rush Landmark  CHIEF COMPLAINT:  AMS / Acute Respiratory Failure  HISTORY OF PRESENT ILLNESS:   60 y/o M with PMH of anemia, anxiety, seizures (last in 2015), neuropathy, and prior ETOH abuse who [facility] to [facility] on 11/10 after being found altered at home.    Per chart review, the patient's nephew reported he entered the patients room and found him lying on the floor with blood from his mouth and left hand - family thought he was postictal.  Family also reported he wasn't as responsive as usual the day prior to admit and had fever.  There was also reports of the patients dog having lyme disease and his dog had to be put down 11/9.  EMS was activated and reported snoring respirations. ER exam notes him to be febrile to 104, hypertensive, tachypneic, altered and no visualized rashes, no nuchal regidity. During lab draw, he had a witnessed seizure and he was treated with ativan.  Due to poor mental status and concern for airway protection, he was intubated and started on propofol.  CT of the head assessed and negative for acute process.  LP was not performed in the ER.  He was treated with IVF and empiric broad spectrum antibiotics. CXR did not show acute findings.  UA with few bacteria, large Hgb, 15 ketones, protein >300, no wbc.  Initial labs - WBC 10.3, Hgb 13.5, platelets 189, Na 138, K 4, Cl 99, CO2 22, sr Cr 2.04, glucose 136 AST 344, ALT 68, CK 39,251, BNP 610 & lactic acid 3.54.  The patient was transferred to [facility] for further evaluation.      PAST MEDICAL HISTORY :  He  has a past medical history of Alcohol abuse; Anemia; Anxiety; Hypertension; Neuropathy (HCC); and Seizures (HCC).  PAST SURGICAL HISTORY: He  has a past surgical history that includes Throat surgery; Colonoscopy (05/18/2007); and Colonoscopy with propofol  (N/A, 03/16/2015).  Allergies  Allergen Reactions  . Bee Venom Anaphylaxis  . Penicillins     Reaction: unknown, childhood allergy    No current facility-administered medications on file prior to encounter.    Current Outpatient Prescriptions on File Prior to Encounter  Medication Sig  . Atorvastatin Calcium (LIPITOR PO) Take by mouth.  Marland Kitchen b complex vitamins capsule Take 1 capsule by mouth daily.  Marland Kitchen CILOSTAZOL PO Take by mouth.  . gabapentin (NEURONTIN) 600 MG tablet Take 1 tablet (600 mg total) by mouth 3 (three) times daily.  . Gabapentin, Once-Daily, 300 MG TABS Take 900 mg by mouth 2 (two) times daily as needed.  . magnesium oxide (MAG-OX) 400 MG tablet Take 400 mg by mouth at bedtime.  . metoprolol (LOPRESSOR) 50 MG tablet Take 50 mg by mouth 2 (two) times daily. For systolic blood pressure above 90  . omeprazole (PRILOSEC) 20 MG capsule Take 20 mg by mouth every morning.  . tamsulosin (FLOMAX) 0.4 MG CAPS capsule Take 0.4 mg by mouth daily.    FAMILY HISTORY:  His indicated that his mother is alive. He indicated that his father is deceased. He indicated that both of his brothers are alive. He indicated that the status of his neg hx is unknown.    SOCIAL HISTORY: He  reports that he quit smoking about 20 years ago. His smoking use included Cigarettes. He has a 10.00 pack-year  smoking history. He has never used smokeless tobacco. He reports that he drinks about 18.0 oz of alcohol per week . He reports that he does not use drugs.  REVIEW OF SYSTEMS:  Unable to complete as patient is altered on mechanical ventilation.    SUBJECTIVE:  RN reports BM on arrival to unit, agitated at times, BP elevated in route (SBP 200)  VITAL SIGNS: BP (!) 148/114   Pulse 114   Temp 101.3 F (38.5 C)   Resp 14   Ht 6\' 3"  (1.905 m)   Wt 204 lb (92.5 kg)   SpO2 100%   BMI 25.50 kg/m   HEMODYNAMICS:    VENTILATOR SETTINGS: Vent Mode: PRVC FiO2 (%):  [100 %] 100 % Set Rate:  [14 bmp] 14  bmp Vt Set:  [550 mL] 550 mL PEEP:  [5 cmH20] 5 cmH20 Plateau Pressure:  [7 cmH20] 7 cmH20  INTAKE / OUTPUT: No intake/output data recorded.  PHYSICAL EXAMINATION: General:  Adult male in NAD on vent  Neuro:  Sedate, moves all ext's to painful stimuli, neck supple HEENT:  ETT, mm pink/moist, no jvd  Cardiovascular:  s1s2 rrr, no m/r/g  Lungs:  Even/non-labored, lungs bilaterally clear  Abdomen:  Obese/soft, bsx4 active  Musculoskeletal:  No acute deformities Skin:  No rashes or lesions, no edema   LABS:  BMET  Recent Labs Lab 04/12/16 1051  NA 138  K 4.0  CL 99*  CO2 22  BUN 19  CREATININE 2.04*  GLUCOSE 136*    Electrolytes  Recent Labs Lab 04/12/16 1051  CALCIUM 9.1    CBC  Recent Labs Lab 04/12/16 1051  WBC 10.3  HGB 13.5  HCT 41.8  PLT 189    Coag's  Recent Labs Lab 04/12/16 1051  INR 1.02    Sepsis Markers  Recent Labs Lab 04/12/16 1102  LATICACIDVEN 3.54*    ABG  Recent Labs Lab 04/12/16 1300  PHART 7.444  PCO2ART 34.4  PO2ART 337*    Liver Enzymes  Recent Labs Lab 04/12/16 1051  AST 344*  ALT 68*  ALKPHOS 71  BILITOT 2.1*  ALBUMIN 4.0    Cardiac Enzymes No results for input(s): TROPONINI, PROBNP in the last 168 hours.  Glucose  Recent Labs Lab 04/12/16 1108  GLUCAP 142*    Imaging Ct Head Wo Contrast  Result Date: 04/12/2016 CLINICAL DATA:  Unwitnessed seizure today. History of seizure disorder, but no seizure in 2 years. Fever, tachycardia and intubation. EXAM: CT HEAD WITHOUT CONTRAST TECHNIQUE: Contiguous axial images were obtained from the base of the skull through the vertex without intravenous contrast. COMPARISON:  CT head 11/02/2011. FINDINGS: Brain: There is no evidence of acute intracranial hemorrhage, mass lesion, brain edema or extra-axial fluid collection. Stable mild generalized atrophy. There is no CT evidence of acute cortical infarction. Vascular: Intracranial vascular calcifications are  present. Skull: Negative for fracture or focal lesion. Sinuses/Orbits: Patient is intubated. There is mild mucosal thickening in the ethmoid sinuses. There is a small amount of fluid dependently in the sphenoid sinus. The frontal and maxillary sinuses are clear. The mastoid air cells and middle ears are clear. No orbital abnormalities are seen. Other: None. IMPRESSION: 1. No acute intracranial findings. 2. Mild paranasal sinus mucosal thickening and fluid attributed to intubation. Electronically Signed   By: Carey Bullocks M.D.   On: 04/12/2016 12:30   Dg Chest Port 1 View  Result Date: 04/12/2016 CLINICAL DATA:  Fever and tachycardia EXAM: PORTABLE CHEST 1 VIEW COMPARISON:  December 11, 2015 FINDINGS: There is no edema or consolidation. The heart size and pulmonary vascularity are normal. No adenopathy. There old healed rib fractures on the right. IMPRESSION: No edema or consolidation. Electronically Signed   By: Bretta BangWilliam  Woodruff III M.D.   On: 04/12/2016 11:18   Dg Chest Port 1v Same Day  Result Date: 04/12/2016 CLINICAL DATA:  Intubation and nasal/ orogastric tube placement. EXAM: PORTABLE CHEST 1 VIEW COMPARISON:  04/12/2016 FINDINGS: Endotracheal tube tip projects 4 cm above the Carina. Nasal/orogastric tube passes well below the diaphragm into the mid stomach. No other change from the prior study. No acute findings in the lungs. IMPRESSION: 1. Endotracheal tube tip projects 4 cm above the Carina. 2. Nasal/orogastric tube is well positioned passing well into the stomach. Electronically Signed   By: Amie Portlandavid  Ormond M.D.   On: 04/12/2016 11:42     STUDIES:  CT Head 11/10 >> negative   CULTURES: BCx2 11/10 >>  UA 11/10 >> neg UC 11/10 >> Tracheal Aspirate 11/10 >>   ANTIBIOTICS: Vanco 11/10 >>  Aztreonam 11/10 >>  Levaquin 11/10 >>   SIGNIFICANT EVENTS: 11/10  Admit with AMS, seizures, fever to 104   LINES/TUBES: ETT 11/10 >>   Canary BrimBrandi Ollis, NP-C [facility] Pgr: [phone] or if no answer [phone]   DISCUSSION:  60 year old male with PMH of seizure and etoh abuse who was found down, febrile to 103 and with severe rhabdo.  On exam, neck is supple, lungs are clear, pupils are reactive, moving all ext to pain (not command while on sedation).  I reviewed [facility] CXR myself, ETT ok, no infiltrate.  Discussed with [facility]-EDP.    ASSESSMENT / PLAN:  PULMONARY A: VDRF due to inability to protect his airway. P:   - Full vent support. - ABG and CXR now and in AM - Titrate O2 for sat of 88-92% - Adjust vent to ABG  CARDIOVASCULAR A:  Sinus tach and hypertension P:  - Propofol drip - Monitor post sedation if remains hypertensive will add a beta blocker - Tele monitoring  RENAL A:   Cr 2.0 Rhabdo P:   - Alkalinize urine - BMET in AM - Replace electrolytes as indicated. - Check serum osmolality - Very unlikely to be DKA. - UDS  GASTROINTESTINAL A:   Elevated LFTs, likely etoh. P:   - LFTs in AM - NPO - TF in AM.  HEMATOLOGIC A:   Nothing acute P:  - CBC in AM - Transfuse per ICU protocol  INFECTIOUS A:   ?encephalitis vs asp pna that is yet to blossom on CXR ? Tick born disease. P:   - ID consult - Aztreonam/vanc/levofloxacin - F/U on cultures. - ?need for acyclovir, will defer to neuro and ID.  ENDOCRINE A:   No history of DM   P:   - Monitor.  NEUROLOGIC A:   AMS Seizure ?encephalitis Etoh abuse P:   RASS goal: 0 - EEG - Neuro consult - Propofol drip. - Fentanyl pushes. - Defer LP to neurology - Thiamine/folate/MVI.  FAMILY  - Updates: No family bedside to update.  - Inter-disciplinary family meet or Palliative Care meeting due by:  11/17  The patient is critically ill with multiple organ systems failure and requires high complexity decision making for assessment and support, frequent evaluation and titration of therapies, application of advanced monitoring technologies and extensive  interpretation of multiple databases.   Critical Care Time devoted to patient care services  described in this note is  45  Minutes. This time reflects time of care of this signee Dr Koren BoundWesam Nataliee Shurtz. This critical care time does not reflect procedure time, or teaching time or supervisory time of PA/NP/Med student/Med Resident etc but could involve care discussion time.  Alyson ReedyWesam G. Lundynn Cohoon, M.D. [facility]. Pager: [phone]. After hours pager: [phone].  04/12/2016, 3:24 PM

## 2016-04-12 NOTE — ED Notes (Signed)
[facility] her to transfer

## 2016-04-12 NOTE — ED Notes (Signed)
Lab called RN into room, pt having seizure with white frothy sputum draining from mouth; EDP made aware, pt placed on NRB and orders given for ativan

## 2016-04-12 NOTE — Consult Note (Addendum)
[facility]    Date of Admission:  04/12/2016           Day 1 vancomycin        Day 1 aztreonam        Day 1 levofloxacin       Reason for Consult: Fever following seizure    Referring Physician: Dr. Jefm MilesJake Patterson  Active Problems:   Altered mental status   Fever   Seizure (HCC)   Rhabdomyolysis   Hypertension   Dyslipidemia   Chronic kidney disease   Elevated liver enzymes   Depression   Anxiety   . aztreonam  1 g Intravenous Q8H  . famotidine (PEPCID) IV  20 mg Intravenous Q12H  . folic acid  1 mg Intravenous Daily  . heparin  5,000 Units Subcutaneous Q8H  . [START ON 04/14/2016] levofloxacin (LEVAQUIN) IV  750 mg Intravenous Q48H  . multivitamin  15 mL Oral Daily  . propofol      . thiamine injection  100 mg Intravenous Daily  . vancomycin  750 mg Intravenous Q12H    Recommendations: 1. Continue vancomycin and levofloxacin 2. Discontinue aztreonam 3. Await results of blood cultures and lumbar puncture 4. HIV antibody   Assessment: The source of his fever is uncertain. I doubt that there is any direct association between euthanizing his dog and his acute illness. His presentation is not at all suggestive of Lyme disease. Certainly need another results of his lumbar puncture. I will cover with vancomycin and levofloxacin for now pending further observation and results of blood cultures and lumbar puncture.    HPI: Tony Patterson is a 60 y.o. male with past history of heavy alcohol use and seizures. He was noted by family yesterday to not be answering questions as normal. This morning he was found down on the floor unresponsive. He is brought to the hospital by EMS was found to be febrile to 102.8. He had a witnessed seizure. He was intubated to protect his airway. He has had no more witnessed seizures today but remains unresponsive. He appears to have acute on chronic renal insufficiency. He also appears to have rhabdomyolysis.  Admission notes indicate that he was evaluated by someone within the past week for a flulike illness. He also had to euthanize his dog, supposedly because the dog had Lyme disease.   Review of Systems: Review of Systems  Unable to perform ROS: Mental acuity    Past Medical History:  Diagnosis Date  . Alcohol abuse   . Anemia   . Anxiety   . Hypertension   . Neuropathy (HCC)   . Seizures (HCC)    unknown etiology and on no meds; been 4 years since seizure.    Social History  Substance Use Topics  . Smoking status: Former Smoker    Packs/day: 0.50    Years: 20.00    Types: Cigarettes    Quit date: 06/03/1995  . Smokeless tobacco: Never Used  . Alcohol use 18.0 oz/week    30 Shots of liquor per week     Comment: daily liquor (about 2 drinks most evenings); beer occasionally; Previously drank more.    Family History  Problem Relation Age of Onset  . Fibromyalgia Mother   . Dementia Mother   . Congestive Heart Failure Mother   . Congestive Heart Failure Father   . Heart Problems Father   . Hypertension Brother   . Hypertension Brother   .  Neuropathy Neg Hx   . Colon cancer Neg Hx    Allergies  Allergen Reactions  . Bee Venom Anaphylaxis  . Penicillins     Reaction: unknown, childhood allergy    OBJECTIVE: Blood pressure (!) 148/114, pulse 114, temperature 101.3 F (38.5 C), resp. rate 14, height 6\' 3"  (1.905 m), weight 204 lb (92.5 kg), SpO2 100 %.  Physical Exam  Constitutional:  He is unresponsive on the ventilator.  Eyes: Conjunctivae are normal.  Cardiovascular: Normal rate and regular rhythm.   No murmur heard. Pulmonary/Chest: Effort normal and breath sounds normal. He has no rales.  Abdominal: He exhibits no distension and no mass.  Musculoskeletal: Normal range of motion. He exhibits no edema.  Skin:  He has erythema over his knees and hands. There are scattered bruises on his forearms. There are some small abrasions on his feet.    Lab  Results Lab Results  Component Value Date   WBC 8.4 04/12/2016   HGB 11.6 (L) 04/12/2016   HCT 36.0 (L) 04/12/2016   MCV 89.6 04/12/2016   PLT 146 (L) 04/12/2016    Lab Results  Component Value Date   CREATININE 1.99 (H) 04/12/2016   BUN 19 04/12/2016   NA 138 04/12/2016   K 4.0 04/12/2016   CL 99 (L) 04/12/2016   CO2 22 04/12/2016    Lab Results  Component Value Date   ALT 68 (H) 04/12/2016   AST 344 (H) 04/12/2016   ALKPHOS 71 04/12/2016   BILITOT 2.1 (H) 04/12/2016     Microbiology: Recent Results (from the past 240 hour(s))  Blood Culture (routine x 2)     Status: None (Preliminary result)   Collection Time: 04/12/16 10:52 AM  Result Value Ref Range Status   Specimen Description BLOOD BLOOD LEFT ARM  Final   Special Requests BOTTLES DRAWN AEROBIC AND ANAEROBIC 6cc each  Final   Culture PENDING  Incomplete   Report Status PENDING  Incomplete  Blood Culture (routine x 2)     Status: None (Preliminary result)   Collection Time: 04/12/16 11:07 AM  Result Value Ref Range Status   Specimen Description BLOOD RIGHT ANTECUBITAL  Final   Special Requests BOTTLES DRAWN AEROBIC AND ANAEROBIC 6cc each  Final   Culture PENDING  Incomplete   Report Status PENDING  Incomplete    Tony AstersJohn Brooke Steinhilber, MD [facility] [phone] pager   [phone] cell 04/12/2016, 6:03 PM

## 2016-04-12 NOTE — Progress Notes (Signed)
RT mistakenly reported incorrect VBG values to  Esperanza RichtersJennifer Kendrick RN at [phone]. Correct values are entered on the patient flowsheet.

## 2016-04-12 NOTE — Procedures (Signed)
History: 60 year old male who presented with status epilepticus, still confused  Sedation: Propofol  Technique: This is a 21 channel routine scalp EEG performed at the bedside with bipolar and monopolar montages arranged in accordance to the international 10/20 system of electrode placement. One channel was dedicated to EKG recording.    Background: The background consists of low voltage irregular delta activities with superimposed runs of alpha resuming sleep structures. This pattern is present throughout the EEG.  Photic stimulation: Physiologic driving is now performed  EEG Abnormalities: 1) sleep only EEG  Clinical Interpretation: This EEG is consistent with sedation effect. There was no seizure or seizure predisposition recorded on this study. Please note that a normal EEG does not preclude the possibility of epilepsy.   Ritta SlotMcNeill Samyukta Cura, MD [facility] [phone]  If 7pm- 7am, please page neurology on call as listed in [place].

## 2016-04-12 NOTE — Progress Notes (Signed)
Critical lab result troponin 0.97 received from bedside RN.  Relayed to Dr Isaiah SergeMannam.

## 2016-04-12 NOTE — ED Notes (Signed)
Returned from CT.

## 2016-04-13 ENCOUNTER — Inpatient Hospital Stay (HOSPITAL_COMMUNITY): Payer: Managed Care, Other (non HMO)

## 2016-04-13 DIAGNOSIS — G40901 Epilepsy, unspecified, not intractable, with status epilepticus: Secondary | ICD-10-CM

## 2016-04-13 DIAGNOSIS — J9601 Acute respiratory failure with hypoxia: Secondary | ICD-10-CM | POA: Diagnosis present

## 2016-04-13 DIAGNOSIS — J9811 Atelectasis: Secondary | ICD-10-CM

## 2016-04-13 DIAGNOSIS — R748 Abnormal levels of other serum enzymes: Secondary | ICD-10-CM

## 2016-04-13 LAB — COMPREHENSIVE METABOLIC PANEL
ALBUMIN: 2.6 g/dL — AB (ref 3.5–5.0)
ALT: 71 U/L — AB (ref 17–63)
ANION GAP: 13 (ref 5–15)
AST: 290 U/L — ABNORMAL HIGH (ref 15–41)
Alkaline Phosphatase: 53 U/L (ref 38–126)
BUN: 19 mg/dL (ref 6–20)
CHLORIDE: 102 mmol/L (ref 101–111)
CO2: 26 mmol/L (ref 22–32)
Calcium: 7.5 mg/dL — ABNORMAL LOW (ref 8.9–10.3)
Creatinine, Ser: 2.38 mg/dL — ABNORMAL HIGH (ref 0.61–1.24)
GFR calc non Af Amer: 28 mL/min — ABNORMAL LOW (ref >60)
GFR, EST AFRICAN AMERICAN: 33 mL/min — AB (ref >60)
GLUCOSE: 102 mg/dL — AB (ref 65–99)
Potassium: 3.3 mmol/L — ABNORMAL LOW (ref 3.5–5.1)
SODIUM: 141 mmol/L (ref 135–145)
Total Bilirubin: 1 mg/dL (ref 0.3–1.2)
Total Protein: 5.5 g/dL — ABNORMAL LOW (ref 6.5–8.1)

## 2016-04-13 LAB — CBC
HEMATOCRIT: 36.3 % — AB (ref 39.0–52.0)
Hemoglobin: 11.8 g/dL — ABNORMAL LOW (ref 13.0–17.0)
MCH: 29.3 pg (ref 26.0–34.0)
MCHC: 32.5 g/dL (ref 30.0–36.0)
MCV: 90.1 fL (ref 78.0–100.0)
PLATELETS: 132 10*3/uL — AB (ref 150–400)
RBC: 4.03 MIL/uL — ABNORMAL LOW (ref 4.22–5.81)
RDW: 16.5 % — AB (ref 11.5–15.5)
WBC: 7 10*3/uL (ref 4.0–10.5)

## 2016-04-13 LAB — CK: CK TOTAL: 26145 U/L — AB (ref 49–397)

## 2016-04-13 LAB — GLUCOSE, CAPILLARY: Glucose-Capillary: 71 mg/dL (ref 65–99)

## 2016-04-13 LAB — PHOSPHORUS: PHOSPHORUS: 4.2 mg/dL (ref 2.5–4.6)

## 2016-04-13 LAB — TROPONIN I
TROPONIN I: 0.33 ng/mL — AB (ref <0.03)
Troponin I: 0.61 ng/mL (ref <0.03)

## 2016-04-13 LAB — MAGNESIUM: Magnesium: 1.2 mg/dL — ABNORMAL LOW (ref 1.7–2.4)

## 2016-04-13 MED ORDER — LORAZEPAM 2 MG/ML IJ SOLN
2.0000 mg | INTRAMUSCULAR | Status: DC | PRN
  Administered 2016-04-13 (×2): 3 mg via INTRAVENOUS
  Administered 2016-04-13 – 2016-04-16 (×9): 2 mg via INTRAVENOUS
  Filled 2016-04-13 (×2): qty 1
  Filled 2016-04-13 (×2): qty 2
  Filled 2016-04-13 (×7): qty 1

## 2016-04-13 MED ORDER — SODIUM CHLORIDE 0.9 % IV SOLN
INTRAVENOUS | Status: DC
  Administered 2016-04-13: 09:00:00 via INTRAVENOUS
  Administered 2016-04-14: 100 mL/h via INTRAVENOUS

## 2016-04-13 MED ORDER — MAGNESIUM SULFATE 2 GM/50ML IV SOLN
2.0000 g | Freq: Once | INTRAVENOUS | Status: AC
  Filled 2016-04-13: qty 50

## 2016-04-13 MED ORDER — MAGNESIUM SULFATE 2 GM/50ML IV SOLN
INTRAVENOUS | Status: AC
  Administered 2016-04-13: 2 g
  Filled 2016-04-13: qty 50

## 2016-04-13 MED ORDER — POTASSIUM CHLORIDE CRYS ER 20 MEQ PO TBCR
40.0000 meq | EXTENDED_RELEASE_TABLET | Freq: Once | ORAL | Status: AC
  Administered 2016-04-13: 40 meq via ORAL
  Filled 2016-04-13: qty 2

## 2016-04-13 MED ORDER — FENTANYL CITRATE (PF) 100 MCG/2ML IJ SOLN
12.5000 ug | INTRAMUSCULAR | Status: DC | PRN
  Administered 2016-04-13 – 2016-04-14 (×2): 25 ug via INTRAVENOUS
  Filled 2016-04-13 (×2): qty 2

## 2016-04-13 MED ORDER — PANTOPRAZOLE SODIUM 40 MG PO TBEC
40.0000 mg | DELAYED_RELEASE_TABLET | Freq: Every day | ORAL | Status: DC
  Administered 2016-04-14 – 2016-04-18 (×5): 40 mg via ORAL
  Filled 2016-04-13 (×6): qty 1

## 2016-04-13 MED ORDER — METOPROLOL TARTRATE 50 MG PO TABS
50.0000 mg | ORAL_TABLET | Freq: Two times a day (BID) | ORAL | Status: DC
  Administered 2016-04-13 – 2016-04-18 (×11): 50 mg via ORAL
  Filled 2016-04-13 (×4): qty 1
  Filled 2016-04-13: qty 4
  Filled 2016-04-13 (×5): qty 1
  Filled 2016-04-13: qty 4
  Filled 2016-04-13: qty 1
  Filled 2016-04-13: qty 4
  Filled 2016-04-13: qty 1

## 2016-04-13 MED ORDER — SODIUM CHLORIDE 0.9 % IV SOLN
500.0000 mg | Freq: Two times a day (BID) | INTRAVENOUS | Status: DC
  Administered 2016-04-13 – 2016-04-14 (×3): 500 mg via INTRAVENOUS
  Filled 2016-04-13 (×4): qty 5

## 2016-04-13 MED ORDER — HYDRALAZINE HCL 20 MG/ML IJ SOLN
10.0000 mg | INTRAMUSCULAR | Status: DC | PRN
  Administered 2016-04-13 – 2016-04-16 (×7): 20 mg via INTRAVENOUS
  Filled 2016-04-13 (×8): qty 1

## 2016-04-13 NOTE — Progress Notes (Signed)
Subjective: Greatly improved  Exam: Vitals:   04/13/16 0600 04/13/16 0710  BP: 93/69 97/71  Pulse: 89 98  Resp: 10 10  Temp:     Gen: In bed, NAD Resp: non-labored breathing, no acute distress Abd: soft, nt  Neuro: MS: awake, alert, follows commands, agitated.  JY:NWGNFCN:PERRL, EOMI Motor: MAEW Sensory:responds to stim x 4.   Pertinent Labs: Ck 40,000  Impression: 60 yo M with seizures. I suspect EtOH withdrawal given nephews suspicion he had not had a drink for a few days prior to admission. He was likely down with seizures/status epilepticus for quite some time judging by his CK. His improvement is very reassuring.   Recommendations: 1) keppra 500mg  BID 2) ciwa 3) will follow.   Ritta SlotMcNeill Joram Venson, MD [facility] [phone]  If 7pm- 7am, please page neurology on call as listed in [facility].

## 2016-04-13 NOTE — Progress Notes (Signed)
PULMONARY / CRITICAL CARE MEDICINE   Name: Tony Patterson MRN: [id number] DOB: Aug 09, 1955    ADMISSION DATE:  04/12/2016 CONSULTATION DATE:  04/22/16  REFERRING MD:  Dr. Rush Landmark  CHIEF COMPLAINT:  AMS / Acute Respiratory Failure  BRIEF: 60 y/o male admitted on 11/10 with fever, confusion and seizure.  SUBJECTIVE:  More awake this morning  VITAL SIGNS: BP 97/71   Pulse 98   Temp 99 F (37.2 C) (Core (Comment))   Resp 10   Ht 6\' 3"  (1.905 m)   Wt 95.2 kg (209 lb 14.1 oz)   SpO2 100%   BMI 26.23 kg/m   HEMODYNAMICS:    VENTILATOR SETTINGS: Vent Mode: CPAP;PSV FiO2 (%):  [50 %-100 %] 50 % Set Rate:  [10 bmp-14 bmp] 10 bmp Vt Set:  [550 mL-650 mL] 650 mL PEEP:  [5 cmH20] 5 cmH20 Pressure Support:  [5 cmH20] 5 cmH20 Plateau Pressure:  [7 cmH20-23 cmH20] 14 cmH20  INTAKE / OUTPUT: I/O last 3 completed shifts: In: 2771.1 [I.V.:2163.1; Other:50; NG/GT:90; IV Piggyback:468] Out: 1070 [Urine:1070]  PHYSICAL EXAMINATION: General:  Comfortable on vent HENT: NCAT OP clear ETT in place PULM: Rhonchi bilaterally CV: RRR, no mgr GI: BS+, soft, nontender MSK: normal bulk and tone, no bony abnormalities Neuro: follows simple commands  LABS:  BMET  Recent Labs Lab 04/12/16 1051 04/12/16 1536  NA 138  --   K 4.0  --   CL 99*  --   CO2 22  --   BUN 19  --   CREATININE 2.04* 1.99*  GLUCOSE 136*  --     Electrolytes  Recent Labs Lab 04/12/16 1051  CALCIUM 9.1    CBC  Recent Labs Lab 04/12/16 1051 04/12/16 1536 04/13/16 0656  WBC 10.3 8.4 7.0  HGB 13.5 11.6* 11.8*  HCT 41.8 36.0* 36.3*  PLT 189 146* 132*    Coag's  Recent Labs Lab 04/12/16 1051  INR 1.02    Sepsis Markers  Recent Labs Lab 04/12/16 1102 04/12/16 1536 04/12/16 1959  LATICACIDVEN 3.54* 1.7 1.4    ABG  Recent Labs Lab 04/12/16 1300 04/12/16 1813  PHART 7.444 7.512*  PCO2ART 34.4 25.7*  PO2ART 337* 183.0*    Liver Enzymes  Recent Labs Lab 04/12/16 1051   AST 344*  ALT 68*  ALKPHOS 71  BILITOT 2.1*  ALBUMIN 4.0    Cardiac Enzymes  Recent Labs Lab 04/12/16 1536 04/12/16 1959 04/13/16 0012  TROPONINI 0.96* 0.68* 0.61*    Glucose  Recent Labs Lab 04/12/16 1108  GLUCAP 142*    Imaging Ct Head Wo Contrast  Result Date: 04/12/2016 CLINICAL DATA:  Unwitnessed seizure today. History of seizure disorder, but no seizure in 2 years. Fever, tachycardia and intubation. EXAM: CT HEAD WITHOUT CONTRAST TECHNIQUE: Contiguous axial images were obtained from the base of the skull through the vertex without intravenous contrast. COMPARISON:  CT head 11/02/2011. FINDINGS: Brain: There is no evidence of acute intracranial hemorrhage, mass lesion, brain edema or extra-axial fluid collection. Stable mild generalized atrophy. There is no CT evidence of acute cortical infarction. Vascular: Intracranial vascular calcifications are present. Skull: Negative for fracture or focal lesion. Sinuses/Orbits: Patient is intubated. There is mild mucosal thickening in the ethmoid sinuses. There is a small amount of fluid dependently in the sphenoid sinus. The frontal and maxillary sinuses are clear. The mastoid air cells and middle ears are clear. No orbital abnormalities are seen. Other: None. IMPRESSION: 1. No acute intracranial findings. 2. Mild paranasal sinus mucosal  thickening and fluid attributed to intubation. Electronically Signed   By: Carey BullocksWilliam  Veazey M.D.   On: 04/12/2016 12:30   Dg Chest Port 1 View  Result Date: 04/13/2016 CLINICAL DATA:  ETT. EXAM: PORTABLE CHEST 1 VIEW COMPARISON:  April 12, 2016 FINDINGS: The ETT and NG tube remain, in good position. No pneumothorax. The opacity in the left lung base persists but is improved. No other interval changes. IMPRESSION: Stable support apparatus.  Improving left basilar opacity. Electronically Signed   By: Gerome Samavid  Williams III M.D   On: 04/13/2016 07:30   Dg Chest Port 1 View  Result Date:  04/12/2016 CLINICAL DATA:  Endotracheal tube placement.  Status post seizure. EXAM: PORTABLE CHEST 1 VIEW COMPARISON:  04/12/2016 at 11:28 a.m. FINDINGS: Endotracheal tube tip now projects 3.4 cm above the Carina. Orogastric tube is stable passing below the diaphragm well into the stomach. There is discoid type atelectasis in the lower lung on the left. Mild streaky atelectasis is noted at the right lung base. Lungs otherwise clear. No convincing pleural effusion. No pneumothorax. IMPRESSION: 1. Endotracheal tube is well positioned, tip projecting 3.4 cm above the Carina. 2. Well-positioned nasal/orogastric tube. 3. Mild basilar atelectasis. Electronically Signed   By: Amie Portlandavid  Ormond M.D.   On: 04/12/2016 16:16   Dg Chest Port 1 View  Result Date: 04/12/2016 CLINICAL DATA:  Fever and tachycardia EXAM: PORTABLE CHEST 1 VIEW COMPARISON:  December 11, 2015 FINDINGS: There is no edema or consolidation. The heart size and pulmonary vascularity are normal. No adenopathy. There old healed rib fractures on the right. IMPRESSION: No edema or consolidation. Electronically Signed   By: Bretta BangWilliam  Woodruff III M.D.   On: 04/12/2016 11:18   Dg Chest Port 1v Same Day  Result Date: 04/12/2016 CLINICAL DATA:  Intubation and nasal/ orogastric tube placement. EXAM: PORTABLE CHEST 1 VIEW COMPARISON:  04/12/2016 FINDINGS: Endotracheal tube tip projects 4 cm above the Carina. Nasal/orogastric tube passes well below the diaphragm into the mid stomach. No other change from the prior study. No acute findings in the lungs. IMPRESSION: 1. Endotracheal tube tip projects 4 cm above the Carina. 2. Nasal/orogastric tube is well positioned passing well into the stomach. Electronically Signed   By: Amie Portlandavid  Ormond M.D.   On: 04/12/2016 11:42     STUDIES:  CT Head 11/10 >> negative   CULTURES: BCx2 11/10 >> GPC 1/2 UA 11/10 >> neg UC 11/10 >> Tracheal Aspirate 11/10 >> GNR, GPC clusters, pairs, chains, gram negative  dilococci  ANTIBIOTICS: Vanco 11/10 >>  Aztreonam 11/10 >>  Levaquin 11/10 >>   SIGNIFICANT EVENTS: 11/10  Admit with AMS, seizures, fever to 104   LINES/TUBES: ETT 11/10 >> 11/11   DISCUSSION:  60 year old male with PMH of seizure and etoh abuse who presented with seizure and encephalopathy likely in the setting of EtOH withdrawal as CSF studies are not suggestive of infection.   ASSESSMENT / PLAN:  PULMONARY A: VDRF due to inability to protect his airway > improving P:   Extubate SLP eval post extubation  CARDIOVASCULAR A:  Sinus tach and hypertension > improving P:  Monitor BP Tele monitoring  RENAL A:   Cr 2.0 Rhabdo > improving? P:   check BMET now Monitor BMET and UOP Replace electrolytes as needed  GASTROINTESTINAL A:   Elevated LFTs, EtOH hepatitis P:   Follow LFT SLP eval post extubation, then advance diet  HEMATOLOGIC A:   Nothing acute P:  Monitor for bleeding  INFECTIOUS A:  Aspiration pneumonia with left lower lobe infiltrate P:   Follow cultures Continue antibiotics, wean as able ID appreciated  ENDOCRINE A:   No history of DM   P:   Monitor glucose  NEUROLOGIC A:   Acute encephalopathy Seizure > presumed alcohol related EtOH abuse P:   CIWA protocol post extubation Stop PAD protocol  FAMILY  - Updates: No family bedside to update.  - Inter-disciplinary family meet or Palliative Care meeting due by:  11/17  My cc time 35 minutes  Heber CarolinaBrent McQuaid, MD [facility] Pager: [phone] Cell: [phone] After 3pm or if no response, call [phone]  04/13/2016, 8:03 AM

## 2016-04-13 NOTE — Evaluation (Signed)
Clinical/Bedside Swallow Evaluation Patient Details  Name: Tony Patterson MRN: [id number] Date of Birth: [birth year]  Today's Date: 04/13/2016 Time: SLP Start Time (ACUTE ONLY): 1435 SLP Stop Time (ACUTE ONLY): 1450 SLP Time Calculation (min) (ACUTE ONLY): 15 min  Past Medical History:  Past Medical History:  Diagnosis Date  . Alcohol abuse   . Anemia   . Anxiety   . Hypertension   . Neuropathy (HCC)   . Seizures (HCC)    unknown etiology and on no meds; been 4 years since seizure.   Past Surgical History:  Past Surgical History:  Procedure Laterality Date  . COLONOSCOPY  05/18/2007   JXB:JYNWGNFARMR:Internal hemorrhoids, a single anal papilla, otherwise normal/ Single polyp, as described above, removed   . COLONOSCOPY WITH PROPOFOL N/A 03/16/2015   Procedure: COLONOSCOPY WITH PROPOFOL;  Surgeon: Corbin Adeobert M Rourk, MD;  Location: [facility];  Service: Endoscopy;  Laterality: N/A;  in cecum at 1344. withdrawal time  8min  . THROAT SURGERY     polyps removed x2   HPI:  Pt is a 60 y.o. male with PMH of anemia, anxiety, seizures (last in 2015), neuropathy, and prior ETOH abuse [facility] being found altered at home. Pt was found to be febrile, hypertensive, tachypneic, had witnessed seizure, and was intubated until 11/11. CXR today showed improving L basilar opacity. Bedside swallow eval ordered post-extubation.    Assessment / Plan / Recommendation Clinical Impression  Pt had no overt s/s of aspiration during evaluation. Pt continues to present with AMS and needed cues to attend to PO trials and to feed self. Recommend initiating regular diet, thin liquids, meds whole with liquid, FULL supervision during meals to cue small bites/ sips and to assist with feeding. Assistance can be reduced as cognitive status increases. SLP will f/u x1 to ensure tolerance.    Aspiration Risk  Mild aspiration risk    Diet Recommendation Regular;Thin liquid   Liquid Administration via:  Cup;Straw Medication Administration: Whole meds with liquid Supervision: Staff to assist with self feeding;Full supervision/cueing for compensatory strategies Compensations: Slow rate;Small sips/bites;Minimize environmental distractions Postural Changes: Seated upright at 90 degrees    Other  Recommendations Oral Care Recommendations: Oral care BID   Follow up Recommendations None      Frequency and Duration min 1 x/week  1 week       Prognosis Barriers to Reach Goals: Cognitive deficits      Swallow Study   General HPI: Pt is a 60 y.o. male with PMH of anemia, anxiety, seizures (last in 2015), neuropathy, and prior ETOH abuse who [facility] after being found altered at home. Pt was found to be febrile, hypertensive, tachypneic, had witnessed seizure, and was intubated until 11/11. CXR today showed improving L basilar opacity. Bedside swallow eval ordered post-extubation.  Type of Study: Bedside Swallow Evaluation Previous Swallow Assessment: none in chart Diet Prior to this Study: NPO Temperature Spikes Noted:  (low grade) Respiratory Status: Room air History of Recent Intubation: Yes Length of Intubations (days): 1 days Date extubated: 04/13/16 Behavior/Cognition: Alert;Cooperative;Pleasant mood;Confused Oral Cavity Assessment: Within Functional Limits Oral Cavity - Dentition: Adequate natural dentition Vision: Functional for self-feeding Self-Feeding Abilities: Needs assist Patient Positioning: Upright in bed Baseline Vocal Quality: Hoarse Volitional Cough: Strong Volitional Swallow: Able to elicit    Oral/Motor/Sensory Function Overall Oral Motor/Sensory Function: Within functional limits   Ice Chips Ice chips: Not tested   Thin Liquid Thin Liquid: Within functional limits Presentation:  Cup;Straw    Nectar Thick Nectar Thick Liquid: Not tested   Honey Thick Honey Thick Liquid: Not tested   Puree Puree: Within functional limits Presentation: Spoon    Solid   GO   Solid: Within functional limits Presentation: Self Krystal ClarkFed        Oleksiak, Amy K, MA, CCC-SLP 04/13/2016,2:53 PM [phone]

## 2016-04-13 NOTE — Procedures (Signed)
Extubation Procedure Note  Patient Details:   Name: Tony RutterRobert H Patterson DOB: Apr 21, 1956 MRN: [id number]   Airway Documentation:  Airway 7.5 mm (Active)  Secured at (cm) 26 cm 04/13/2016  7:27 AM  Measured From Lips 04/13/2016  7:27 AM  Secured Location Center 04/13/2016  7:27 AM  Secured By [employer] Tube Holder 04/13/2016  7:27 AM  Tube Holder Repositioned Yes 04/13/2016  7:27 AM  Site Condition Dry 04/13/2016  7:27 AM    Evaluation  O2 sats: stable throughout Complications: No apparent complications Patient did tolerate procedure well. Bilateral Breath Sounds: Clear, Diminished   Yes   Patient extubated to 3lnc. Vital signs stable at this time. No complications.  Patient tolerated well.  RN at bedside. RT will continue to monitor.   Ave Filterdkins, Keelyn Monjaras Williams 04/13/2016, 8:26 AM

## 2016-04-13 NOTE — Progress Notes (Signed)
RT changed patient's ETT holder without complications. Vital signs stable throughout. Patient tolerated well. RT will continue to monitor.

## 2016-04-13 NOTE — Progress Notes (Signed)
Patient ID: Tony Patterson, male   DOB: 08/07/1955, 60 y.o.   MRN: [id number]          [facility] for Infectious Disease  Date of Admission:  04/12/2016           Day 2 vancomycin        Day 2 levofloxacin  Active Problems:   Altered mental status   Fever   Seizure (HCC)   Rhabdomyolysis   Hypertension   Dyslipidemia   Chronic kidney disease   Elevated liver enzymes   Depression   Anxiety   Acute respiratory failure with hypoxia ([place])   . chlorhexidine gluconate (MEDLINE KIT)  15 mL Mouth Rinse BID  . folic acid  1 mg Intravenous Daily  . heparin  5,000 Units Subcutaneous Q8H  . levETIRAcetam  500 mg Intravenous Q12H  . [START ON 04/14/2016] levofloxacin (LEVAQUIN) IV  750 mg Intravenous Q48H  . mouth rinse  15 mL Mouth Rinse QID  . metoprolol tartrate  50 mg Oral BID  . multivitamin  15 mL Oral Daily  . [START ON 04/14/2016] pantoprazole  40 mg Oral Daily  . thiamine injection  100 mg Intravenous Daily  . vancomycin  750 mg Intravenous Q12H    SUBJECTIVE: "I'm not doing too well".  Review of Systems: Review of Systems  Unable to perform ROS: Mental acuity    Past Medical History:  Diagnosis Date  . Alcohol abuse   . Anemia   . Anxiety   . Hypertension   . Neuropathy ([place])   . Seizures ([place])    unknown etiology and on no meds; been 4 years since seizure.    Social History  Substance Use Topics  . Smoking status: Former Smoker    Packs/day: 0.50    Years: 20.00    Types: Cigarettes    Quit date: 06/03/1995  . Smokeless tobacco: Never Used  . Alcohol use 18.0 oz/week    30 Shots of liquor per week     Comment: daily liquor (about 2 drinks most evenings); beer occasionally; Previously drank more.    Family History  Problem Relation Age of Onset  . Fibromyalgia Mother   . Dementia Mother   . Congestive Heart Failure Mother   . Congestive Heart Failure Father   . Heart Problems Father   . Hypertension Brother   . Hypertension Brother   .  Neuropathy Neg Hx   . Colon cancer Neg Hx    Allergies  Allergen Reactions  . Bee Venom Anaphylaxis  . Penicillins     Reaction: unknown, childhood allergy    OBJECTIVE: Vitals:   04/13/16 1200 04/13/16 1300 04/13/16 1309 04/13/16 1400  BP: (!) 175/111 (!) 190/122 (!) 197/122 (!) 207/115  Pulse: (!) 109 (!) 103  (!) 110  Resp: 15 (!) 28  (!) 31  Temp:      TempSrc:      SpO2: 98% 100%  97%  Weight:      Height:       Body mass index is 26.23 kg/m.  Physical Exam  Constitutional:  He is more alert now extubated.  HENT:  Mouth/Throat: No oropharyngeal exudate.  Cardiovascular: Normal rate and regular rhythm.   No murmur heard. Pulmonary/Chest: Effort normal and breath sounds normal. He has no wheezes. He has no rales.  Abdominal: Soft. There is no tenderness.  Musculoskeletal: Normal range of motion. He exhibits no edema or tenderness.  Neurological: He is alert.  He knows he  is in a hospital but is otherwise disoriented. He has mumbled speech. He follows simple commands sporadically.  Skin: No rash noted.  Scattered bruising on arms.    Lab Results Lab Results  Component Value Date   WBC 7.0 04/13/2016   HGB 11.8 (L) 04/13/2016   HCT 36.3 (L) 04/13/2016   MCV 90.1 04/13/2016   PLT 132 (L) 04/13/2016    Lab Results  Component Value Date   CREATININE 2.38 (H) 04/13/2016   BUN 19 04/13/2016   NA 141 04/13/2016   K 3.3 (L) 04/13/2016   CL 102 04/13/2016   CO2 26 04/13/2016    Lab Results  Component Value Date   ALT 71 (H) 04/13/2016   AST 290 (H) 04/13/2016   ALKPHOS 53 04/13/2016   BILITOT 1.0 04/13/2016     Microbiology: Recent Results (from the past 240 hour(s))  Blood Culture (routine x 2)     Status: None (Preliminary result)   Collection Time: 04/12/16 10:52 AM  Result Value Ref Range Status   Specimen Description BLOOD BLOOD LEFT ARM  Final   Special Requests BOTTLES DRAWN AEROBIC AND ANAEROBIC 6cc each  Final   Culture  Setup Time   Final      GRAM POSITIVE COCCI Gram Stain Report Called to,Read Back By and Verified With: TUTTLE S. AT [facility] ON [id number] AT 0710A BY THOMPSON S. IN BOTH AEROBIC AND ANAEROBIC BOTTLES CRITICAL RESULT CALLED TO, READ BACK BY AND VERIFIED WITH: M MACCIA,PHARMD AT 1003 04/13/16 BY L BENFIELD Performed at [place]  Final   Report Status PENDING  Incomplete  Blood Culture (routine x 2)     Status: None (Preliminary result)   Collection Time: 04/12/16 11:07 AM  Result Value Ref Range Status   Specimen Description BLOOD RIGHT ANTECUBITAL  Final   Special Requests BOTTLES DRAWN AEROBIC AND ANAEROBIC 6cc each  Final   Culture NO GROWTH < 24 HOURS  Final   Report Status PENDING  Incomplete  Culture, respiratory (NON-Expectorated)     Status: None (Preliminary result)   Collection Time: 04/12/16  5:06 PM  Result Value Ref Range Status   Specimen Description TRACHEAL ASPIRATE  Final   Special Requests NONE  Final   Gram Stain   Final    MODERATE WBC PRESENT, PREDOMINANTLY PMN FEW GRAM NEGATIVE RODS RARE GRAM POSITIVE COCCI IN PAIRS AND CHAINS RARE GRAM POSITIVE COCCI IN CLUSTERS RARE GRAM NEGATIVE DIPLOCOCCI    Culture TOO YOUNG TO READ  Final   Report Status PENDING  Incomplete  CSF culture     Status: None (Preliminary result)   Collection Time: 04/12/16  5:43 PM  Result Value Ref Range Status   Specimen Description CSF  Final   Special Requests NONE  Final   Gram Stain   Final    WBC PRESENT, PREDOMINANTLY MONONUCLEAR NO ORGANISMS SEEN CYTOSPIN SMEAR    Culture NO GROWTH 1 DAY  Final   Report Status PENDING  Incomplete  MRSA PCR Screening     Status: None   Collection Time: 04/12/16  5:44 PM  Result Value Ref Range Status   MRSA by PCR NEGATIVE NEGATIVE Final    Comment:        The GeneXpert MRSA Assay (FDA approved for NASAL specimens only), is one component of a comprehensive MRSA colonization surveillance program. It is not intended to  diagnose MRSA infection nor to guide or monitor treatment for MRSA infections.  ASSESSMENT: He has had no further seizure activity overnight and has improved neurologically. I do not see any temperatures recorded so far today. One of 2 admission blood cultures has grown coag-negative staph that is a probable contaminant. His lumbar puncture did not show any evidence of meningitis. He has a little bit of atelectasis in the left lower lobe but I do not see clear evidence of pneumonia. I will continue empiric vancomycin and levofloxacin for now.  PLAN: 1. Continue current antibiotics pending further observation and final blood culture results  Michel Bickers, MD [facility] [phone] pager   [phone] cell 04/13/2016, 2:21 PM

## 2016-04-13 NOTE — Progress Notes (Signed)
Systolic BP 170-190's after scheduled metoprolol and PRN 20mg  Hydralazine. [facility] and received order to give another 20mg  Hydralazine. Will continue to monitor BP.

## 2016-04-13 NOTE — Progress Notes (Signed)
Patient systolic 190-200's. Pt agitated and restless with no other symptoms of ETOH withdrawal. 3 days without ETOH per nephew. Reported to Dr. Kendrick FriesMcQuaid. Orders for 2mg  IV Ativan and scheduled Metoprolol.

## 2016-04-14 DIAGNOSIS — A4901 Methicillin susceptible Staphylococcus aureus infection, unspecified site: Secondary | ICD-10-CM

## 2016-04-14 DIAGNOSIS — J690 Pneumonitis due to inhalation of food and vomit: Principal | ICD-10-CM

## 2016-04-14 LAB — URINE CULTURE: Culture: NO GROWTH

## 2016-04-14 LAB — BASIC METABOLIC PANEL
ANION GAP: 16 — AB (ref 5–15)
BUN: 18 mg/dL (ref 6–20)
CALCIUM: 8.3 mg/dL — AB (ref 8.9–10.3)
CO2: 24 mmol/L (ref 22–32)
Chloride: 102 mmol/L (ref 101–111)
Creatinine, Ser: 2.23 mg/dL — ABNORMAL HIGH (ref 0.61–1.24)
GFR calc Af Amer: 35 mL/min — ABNORMAL LOW (ref >60)
GFR, EST NON AFRICAN AMERICAN: 31 mL/min — AB (ref >60)
Glucose, Bld: 81 mg/dL (ref 65–99)
POTASSIUM: 3.7 mmol/L (ref 3.5–5.1)
SODIUM: 142 mmol/L (ref 135–145)

## 2016-04-14 LAB — HEPATIC FUNCTION PANEL
ALBUMIN: 3 g/dL — AB (ref 3.5–5.0)
ALT: 110 U/L — ABNORMAL HIGH (ref 17–63)
AST: 362 U/L — AB (ref 15–41)
Alkaline Phosphatase: 63 U/L (ref 38–126)
BILIRUBIN TOTAL: 1.2 mg/dL (ref 0.3–1.2)
Bilirubin, Direct: 0.3 mg/dL (ref 0.1–0.5)
Indirect Bilirubin: 0.9 mg/dL (ref 0.3–0.9)
TOTAL PROTEIN: 6.3 g/dL — AB (ref 6.5–8.1)

## 2016-04-14 LAB — BLOOD CULTURE ID PANEL (REFLEXED)
Acinetobacter baumannii: NOT DETECTED
CANDIDA ALBICANS: NOT DETECTED
CANDIDA GLABRATA: NOT DETECTED
CANDIDA PARAPSILOSIS: NOT DETECTED
CANDIDA TROPICALIS: NOT DETECTED
Candida krusei: NOT DETECTED
ENTEROBACTER CLOACAE COMPLEX: NOT DETECTED
ENTEROBACTERIACEAE SPECIES: NOT DETECTED
ESCHERICHIA COLI: NOT DETECTED
Enterococcus species: NOT DETECTED
Haemophilus influenzae: NOT DETECTED
KLEBSIELLA OXYTOCA: NOT DETECTED
KLEBSIELLA PNEUMONIAE: NOT DETECTED
Listeria monocytogenes: NOT DETECTED
Methicillin resistance: NOT DETECTED
Neisseria meningitidis: NOT DETECTED
PSEUDOMONAS AERUGINOSA: NOT DETECTED
Proteus species: NOT DETECTED
STREPTOCOCCUS PNEUMONIAE: NOT DETECTED
STREPTOCOCCUS PYOGENES: NOT DETECTED
STREPTOCOCCUS SPECIES: NOT DETECTED
Serratia marcescens: NOT DETECTED
Staphylococcus aureus (BCID): NOT DETECTED
Staphylococcus species: DETECTED — AB
Streptococcus agalactiae: NOT DETECTED

## 2016-04-14 LAB — CBC
HEMATOCRIT: 38.7 % — AB (ref 39.0–52.0)
Hemoglobin: 12.7 g/dL — ABNORMAL LOW (ref 13.0–17.0)
MCH: 29.2 pg (ref 26.0–34.0)
MCHC: 32.8 g/dL (ref 30.0–36.0)
MCV: 89 fL (ref 78.0–100.0)
Platelets: 136 10*3/uL — ABNORMAL LOW (ref 150–400)
RBC: 4.35 MIL/uL (ref 4.22–5.81)
RDW: 16.5 % — AB (ref 11.5–15.5)
WBC: 9.5 10*3/uL (ref 4.0–10.5)

## 2016-04-14 LAB — HERPES SIMPLEX VIRUS(HSV) DNA BY PCR
HSV 1 DNA: NEGATIVE
HSV 2 DNA: NEGATIVE

## 2016-04-14 MED ORDER — ADULT MULTIVITAMIN W/MINERALS CH
1.0000 | ORAL_TABLET | Freq: Every day | ORAL | Status: DC
  Administered 2016-04-14: 1 via ORAL
  Filled 2016-04-14: qty 1

## 2016-04-14 MED ORDER — NAPHAZOLINE-PHENIRAMINE 0.025-0.3 % OP SOLN
1.0000 [drp] | Freq: Four times a day (QID) | OPHTHALMIC | Status: DC | PRN
  Administered 2016-04-15 (×2): 1 [drp] via OPHTHALMIC
  Filled 2016-04-14: qty 5

## 2016-04-14 MED ORDER — SODIUM CHLORIDE 0.9 % IV SOLN
INTRAVENOUS | Status: DC
  Administered 2016-04-16: 08:00:00 via INTRAVENOUS

## 2016-04-14 NOTE — Progress Notes (Signed)
Patient ID: Tony Patterson, male   DOB: [birth year], 60 y.o.   MRN: [id number]          [facility]  Date of Admission:  04/12/2016           Day 3 vancomycin        Day 3 levofloxacin  Active Problems:   Altered mental status   Fever   Seizure (HCC)   Rhabdomyolysis   Hypertension   Dyslipidemia   Chronic kidney disease   Elevated liver enzymes   Depression   Anxiety   Acute respiratory failure with hypoxia ([place])   . chlorhexidine gluconate (MEDLINE KIT)  15 mL Mouth Rinse BID  . folic acid  1 mg Intravenous Daily  . heparin  5,000 Units Subcutaneous Q8H  . levETIRAcetam  500 mg Intravenous Q12H  . levofloxacin (LEVAQUIN) IV  750 mg Intravenous Q48H  . mouth rinse  15 mL Mouth Rinse QID  . metoprolol tartrate  50 mg Oral BID  . multivitamin with minerals  1 tablet Oral Daily  . pantoprazole  40 mg Oral Daily  . thiamine injection  100 mg Intravenous Daily    SUBJECTIVE: He is feeling better.  Review of Systems: Review of Systems  Unable to perform ROS: Mental acuity    Past Medical History:  Diagnosis Date  . Alcohol abuse   . Anemia   . Anxiety   . Hypertension   . Neuropathy ([place])   . Seizures ([employer])    unknown etiology and on no meds; been 4 years since seizure.    Social History  Substance Use Topics  . Smoking status: Former Smoker    Packs/day: 0.50    Years: 20.00    Types: Cigarettes    Quit date: 06/03/1995  . Smokeless tobacco: Never Used  . Alcohol use 18.0 oz/week    30 Shots of liquor per week     Comment: daily liquor (about 2 drinks most evenings); beer occasionally; Previously drank more.    Family History  Problem Relation Age of Onset  . Fibromyalgia Mother   . Dementia Mother   . Congestive Heart Failure Mother   . Congestive Heart Failure Father   . Heart Problems Father   . Hypertension Brother   . Hypertension Brother   . Neuropathy Neg Hx   . Colon cancer Neg Hx    Allergies  Allergen  Reactions  . Bee Venom Anaphylaxis  . Penicillins Other (See Comments)    Childhood allergic reaction - no other information available    OBJECTIVE: Vitals:   04/14/16 0900 04/14/16 1000 04/14/16 1100 04/14/16 1200  BP: (!) 155/107 (!) 139/100 (!) 130/95 (!) 162/105  Pulse: (!) 103 (!) 107 91 93  Resp: 17 15 20 19   Temp:      TempSrc:      SpO2: 95% 97% 96% 94%  Weight:      Height:       Body mass index is 25.1 kg/m.  Physical Exam  Constitutional:  He is more alert and talkative.  HENT:  Mouth/Throat: No oropharyngeal exudate.  Cardiovascular: Normal rate and regular rhythm.   No murmur heard. Pulmonary/Chest: Effort normal and breath sounds normal. He has no wheezes. He has no rales.  Abdominal: Soft. There is no tenderness.  Musculoskeletal: Normal range of motion. He exhibits no edema or tenderness.  Neurological: He is alert.  Skin: No rash noted.  Scattered bruising on arms.    Lab  Results Lab Results  Component Value Date   WBC 9.5 04/14/2016   HGB 12.7 (L) 04/14/2016   HCT 38.7 (L) 04/14/2016   MCV 89.0 04/14/2016   PLT 136 (L) 04/14/2016    Lab Results  Component Value Date   CREATININE 2.23 (H) 04/14/2016   BUN 18 04/14/2016   NA 142 04/14/2016   K 3.7 04/14/2016   CL 102 04/14/2016   CO2 24 04/14/2016    Lab Results  Component Value Date   ALT 110 (H) 04/14/2016   AST 362 (H) 04/14/2016   ALKPHOS 63 04/14/2016   BILITOT 1.2 04/14/2016     Microbiology: Recent Results (from the past 240 hour(s))  Urine culture     Status: None   Collection Time: 04/12/16 10:46 AM  Result Value Ref Range Status   Specimen Description URINE, RANDOM  Final   Special Requests NONE  Final   Culture NO GROWTH Performed at [facility]   Final   Report Status 04/14/2016 FINAL  Final  Blood Culture (routine x 2)     Status: Abnormal (Preliminary result)   Collection Time: 04/12/16 10:52 AM  Result Value Ref Range Status   Specimen Description BLOOD  BLOOD LEFT ARM  Final   Special Requests BOTTLES DRAWN AEROBIC AND ANAEROBIC 6cc each  Final   Culture  Setup Time   Final    GRAM POSITIVE COCCI Gram Stain Report Called to,Read Back By and Verified With: TUTTLE S. AT [facility] ON [id number] AT 0710A BY THOMPSON S. IN BOTH AEROBIC AND ANAEROBIC BOTTLES CRITICAL RESULT CALLED TO, READ BACK BY AND VERIFIED WITH: M MACCIA,PHARMD AT 1003 04/13/16 BY L BENFIELD    Culture (A)  Final    STAPHYLOCOCCUS SPECIES (COAGULASE NEGATIVE) CULTURE REINCUBATED FOR BETTER GROWTH Performed at [facility]    Report Status PENDING  Incomplete  Blood Culture ID Panel (Reflexed)     Status: Abnormal   Collection Time: 04/12/16 10:52 AM  Result Value Ref Range Status   Enterococcus species NOT DETECTED NOT DETECTED Final   Listeria monocytogenes NOT DETECTED NOT DETECTED Final   Staphylococcus species DETECTED (A) NOT DETECTED Final    Comment: CRITICAL RESULT CALLED TO, READ BACK BY AND VERIFIED WITH: M MACCIA,PHARMD AT 1003 04/13/16 BY L BENFIELD    Staphylococcus aureus NOT DETECTED NOT DETECTED Final   Methicillin resistance NOT DETECTED NOT DETECTED Final   Streptococcus species NOT DETECTED NOT DETECTED Final   Streptococcus agalactiae NOT DETECTED NOT DETECTED Final   Streptococcus pneumoniae NOT DETECTED NOT DETECTED Final   Streptococcus pyogenes NOT DETECTED NOT DETECTED Final   Acinetobacter baumannii NOT DETECTED NOT DETECTED Final   Enterobacteriaceae species NOT DETECTED NOT DETECTED Final   Enterobacter cloacae complex NOT DETECTED NOT DETECTED Final   Escherichia coli NOT DETECTED NOT DETECTED Final   Klebsiella oxytoca NOT DETECTED NOT DETECTED Final   Klebsiella pneumoniae NOT DETECTED NOT DETECTED Final   Proteus species NOT DETECTED NOT DETECTED Final   Serratia marcescens NOT DETECTED NOT DETECTED Final   Haemophilus influenzae NOT DETECTED NOT DETECTED Final   Neisseria meningitidis NOT DETECTED NOT DETECTED Final   Pseudomonas  aeruginosa NOT DETECTED NOT DETECTED Final   Candida albicans NOT DETECTED NOT DETECTED Final   Candida glabrata NOT DETECTED NOT DETECTED Final   Candida krusei NOT DETECTED NOT DETECTED Final   Candida parapsilosis NOT DETECTED NOT DETECTED Final   Candida tropicalis NOT DETECTED NOT DETECTED Final    Comment: Performed at  [facility]  Blood Culture (routine x 2)     Status: None (Preliminary result)   Collection Time: 04/12/16 11:07 AM  Result Value Ref Range Status   Specimen Description BLOOD RIGHT ANTECUBITAL  Final   Special Requests BOTTLES DRAWN AEROBIC AND ANAEROBIC 6cc each  Final   Culture NO GROWTH 2 DAYS  Final   Report Status PENDING  Incomplete  Culture, respiratory (NON-Expectorated)     Status: None (Preliminary result)   Collection Time: 04/12/16  5:06 PM  Result Value Ref Range Status   Specimen Description TRACHEAL ASPIRATE  Final   Special Requests NONE  Final   Gram Stain   Final    MODERATE WBC PRESENT, PREDOMINANTLY PMN FEW GRAM NEGATIVE RODS RARE GRAM POSITIVE COCCI IN PAIRS AND CHAINS RARE GRAM POSITIVE COCCI IN CLUSTERS RARE GRAM NEGATIVE DIPLOCOCCI    Culture TOO YOUNG TO READ  Final   Report Status PENDING  Incomplete  CSF culture     Status: None (Preliminary result)   Collection Time: 04/12/16  5:43 PM  Result Value Ref Range Status   Specimen Description CSF  Final   Special Requests NONE  Final   Gram Stain   Final    WBC PRESENT, PREDOMINANTLY MONONUCLEAR NO ORGANISMS SEEN CYTOSPIN SMEAR    Culture NO GROWTH 2 DAYS  Final   Report Status PENDING  Incomplete  MRSA PCR Screening     Status: None   Collection Time: 04/12/16  5:44 PM  Result Value Ref Range Status   MRSA by PCR NEGATIVE NEGATIVE Final    Comment:        The GeneXpert MRSA Assay (FDA approved for NASAL specimens only), is one component of a comprehensive MRSA colonization surveillance program. It is not intended to diagnose MRSA infection nor to guide  or monitor treatment for MRSA infections.      ASSESSMENT: He has defervesced. One blood culture has grown methicillin sensitive coagulase negative staph which is probably an insignificant contaminant. He may have mild left lower lobe pneumonia. I favor continuing levofloxacin for at least 2 more days. I will stop vancomycin now.  PLAN: 1. Continue levofloxacin for 2 more days 2. Discontinue vancomycin 3. I will sign off now  Michel Bickers, MD [facility] [place] [phone] pager   [phone] cell 04/14/2016, 12:46 PM

## 2016-04-14 NOTE — Progress Notes (Signed)
PULMONARY / CRITICAL CARE MEDICINE   Name: Tony RutterRobert H Shew MRN: [id number] DOB: [birth year]    ADMISSION DATE:  04/12/2016 CONSULTATION DATE:  04/22/16  REFERRING MD:  Dr. Rush Landmarkegeler  CHIEF COMPLAINT:  AMS / Acute Respiratory Failure  BRIEF: 60 y/o male admitted on 11/10 with fever, confusion and seizure.  SUBJECTIVE:  Extubated 11/11, no distress overnigh  VITAL SIGNS: BP (!) 139/100   Pulse (!) 107   Temp 100 F (37.8 C) (Oral)   Resp 15   Ht 6\' 3"  (1.905 m)   Wt 91.1 kg (200 lb 13.4 oz)   SpO2 97%   BMI 25.10 kg/m   HEMODYNAMICS:    VENTILATOR SETTINGS:    INTAKE / OUTPUT: I/O last 3 completed shifts: In: 5756.7 [I.V.:4106.7; Other:582; NG/GT:90; IV Piggyback:978] Out: 3600 [Urine:3600]  PHYSICAL EXAMINATION: General:  Comfortable in bed HENT: NCAT OP clear PULM: Rhonchi bilaterally CV: RRR, no mgr GI: BS+, soft, nontender MSK: normal bulk and tone, no bony abnormalities Neuro: conversant  LABS:  BMET  Recent Labs Lab 04/12/16 1051 04/12/16 1536 04/13/16 0656 04/14/16 0219  NA 138  --  141 142  K 4.0  --  3.3* 3.7  CL 99*  --  102 102  CO2 22  --  26 24  BUN 19  --  19 18  CREATININE 2.04* 1.99* 2.38* 2.23*  GLUCOSE 136*  --  102* 81    Electrolytes  Recent Labs Lab 04/12/16 1051 04/13/16 0656 04/14/16 0219  CALCIUM 9.1 7.5* 8.3*  MG  --  1.2*  --   PHOS  --  4.2  --     CBC  Recent Labs Lab 04/12/16 1536 04/13/16 0656 04/14/16 0219  WBC 8.4 7.0 9.5  HGB 11.6* 11.8* 12.7*  HCT 36.0* 36.3* 38.7*  PLT 146* 132* 136*    Coag's  Recent Labs Lab 04/12/16 1051  INR 1.02    Sepsis Markers  Recent Labs Lab 04/12/16 1102 04/12/16 1536 04/12/16 1959  LATICACIDVEN 3.54* 1.7 1.4    ABG  Recent Labs Lab 04/12/16 1300 04/12/16 1813  PHART 7.444 7.512*  PCO2ART 34.4 25.7*  PO2ART 337* 183.0*    Liver Enzymes  Recent Labs Lab 04/12/16 1051 04/13/16 0656 04/14/16 0219  AST 344* 290* 362*  ALT 68* 71* 110*   ALKPHOS 71 53 63  BILITOT 2.1* 1.0 1.2  ALBUMIN 4.0 2.6* 3.0*    Cardiac Enzymes  Recent Labs Lab 04/12/16 1959 04/13/16 0012 04/13/16 0656  TROPONINI 0.68* 0.61* 0.33*    Glucose  Recent Labs Lab 04/12/16 1108 04/13/16 1540  GLUCAP 142* 71    Imaging No results found.   STUDIES:  CT Head 11/10 >> negative   CULTURES: BCx2 11/10 >> GPC 1/2 UA 11/10 >> neg UC 11/10 >> Tracheal Aspirate 11/10 >> staph epi  ANTIBIOTICS: Vanco 11/10 >> 11/12 Aztreonam 11/10 >> 11/11 Levaquin 11/10 >>   SIGNIFICANT EVENTS: 11/10  Admit with AMS, seizures, fever to 104   LINES/TUBES: ETT 11/10 >> 11/11   DISCUSSION:  60 year old male with PMH of seizure and etoh abuse who presented with seizure and encephalopathy likely in the setting of EtOH withdrawal as CSF studies are not suggestive of infection. Had left lower lobe pneumonia.  ASSESSMENT / PLAN:  PULMONARY A: VDRF due to inability to protect his airway > improving HCAP LLL P:   Monitor respiratory status  CARDIOVASCULAR A:  Sinus tach and hypertension > due to alcohol withdrawal P:  Monitor BP  Continue metoprolol Tele monitoring  RENAL A:   Cr 2.0 Rhabdo > improving P:   Monitor BMET and UOP Replace electrolytes as needed Continue IVF  GASTROINTESTINAL A:   Elevated LFTs, EtOH hepatitis P:   Follow LFT Advance diet  HEMATOLOGIC A:   Nothing acute P:  Monitor for bleeding  INFECTIOUS A:   Aspiration pneumonia with left lower lobe infiltrate P:   Follow cultures Continue levaquin  ENDOCRINE A:   No history of DM   P:   Monitor glucose  NEUROLOGIC A:   Acute encephalopathy Seizure > presumed alcohol related EtOH abuse Neuropathy Fall risk P:   CIWA protocol PT consult  FAMILY  - Updates: nephew updated bedside  - Inter-disciplinary family meet or Palliative Care meeting due by:  11/17  To med surg, TR[facility]   Heber CarolinaBrent McQuaid, MD [facility] Pager: [phone] Cell:  [phone] After 3pm or if no response, call [phone]  04/14/2016, 11:37 AM

## 2016-04-14 NOTE — Progress Notes (Signed)
Subjective: Extubated, able to tell me that he did stop drinking for a few days prior to admission. Unclear how much he typically drinks.   Exam: Vitals:   04/14/16 1100 04/14/16 1200  BP: (!) 130/95 (!) 162/105  Pulse: 91 93  Resp: 20 19  Temp:     Gen: In bed, NAD Resp: non-labored breathing, no acute distress Abd: soft, nt  Neuro: MS: awake, alert, follows commands, agitated.  NG:EXBMWCN:PERRL, EOMI Motor: MAEW Sensory:responds to stim x 4.   Pertinent Labs: CK trending down a 26,000  Impression: 60 yo M with EtOH withdrawal and seizures. He has the appearance of EtOH delirium at the current time, though encephalopathy due to prolonged seizure may be playing a role as well. He was likely down with seizures/status epilepticus for quite some time judging by his CK. His improvement continues to be reassuring.   Given that this history is very consistent with EtOH withdrawal, I would not continue keppra.    Recommendations: 1) ciwa 2) continue thiamine 3) No further recommendations at this time.  Please call with further questions or concerns.   Ritta SlotMcNeill Ramel Tobon, MD [facility] [phone]  If 7pm- 7am, please page neurology on call as listed in AMION.

## 2016-04-15 DIAGNOSIS — N179 Acute kidney failure, unspecified: Secondary | ICD-10-CM

## 2016-04-15 DIAGNOSIS — F1010 Alcohol abuse, uncomplicated: Secondary | ICD-10-CM

## 2016-04-15 DIAGNOSIS — R740 Nonspecific elevation of levels of transaminase and lactic acid dehydrogenase [LDH]: Secondary | ICD-10-CM

## 2016-04-15 DIAGNOSIS — I10 Essential (primary) hypertension: Secondary | ICD-10-CM

## 2016-04-15 DIAGNOSIS — E46 Unspecified protein-calorie malnutrition: Secondary | ICD-10-CM

## 2016-04-15 DIAGNOSIS — E8809 Other disorders of plasma-protein metabolism, not elsewhere classified: Secondary | ICD-10-CM

## 2016-04-15 DIAGNOSIS — J189 Pneumonia, unspecified organism: Secondary | ICD-10-CM

## 2016-04-15 DIAGNOSIS — R7401 Elevation of levels of liver transaminase levels: Secondary | ICD-10-CM

## 2016-04-15 DIAGNOSIS — W19XXXS Unspecified fall, sequela: Secondary | ICD-10-CM

## 2016-04-15 DIAGNOSIS — G621 Alcoholic polyneuropathy: Secondary | ICD-10-CM

## 2016-04-15 DIAGNOSIS — D62 Acute posthemorrhagic anemia: Secondary | ICD-10-CM

## 2016-04-15 DIAGNOSIS — M6282 Rhabdomyolysis: Secondary | ICD-10-CM

## 2016-04-15 DIAGNOSIS — F10231 Alcohol dependence with withdrawal delirium: Secondary | ICD-10-CM

## 2016-04-15 DIAGNOSIS — E876 Hypokalemia: Secondary | ICD-10-CM

## 2016-04-15 DIAGNOSIS — W19XXXA Unspecified fall, initial encounter: Secondary | ICD-10-CM

## 2016-04-15 LAB — CULTURE, BLOOD (ROUTINE X 2)

## 2016-04-15 LAB — COMPREHENSIVE METABOLIC PANEL
ALBUMIN: 2.8 g/dL — AB (ref 3.5–5.0)
ALK PHOS: 59 U/L (ref 38–126)
ALT: 93 U/L — ABNORMAL HIGH (ref 17–63)
ANION GAP: 15 (ref 5–15)
AST: 187 U/L — ABNORMAL HIGH (ref 15–41)
BUN: 16 mg/dL (ref 6–20)
CALCIUM: 7.9 mg/dL — AB (ref 8.9–10.3)
CHLORIDE: 101 mmol/L (ref 101–111)
CO2: 25 mmol/L (ref 22–32)
Creatinine, Ser: 1.92 mg/dL — ABNORMAL HIGH (ref 0.61–1.24)
GFR calc non Af Amer: 37 mL/min — ABNORMAL LOW (ref >60)
GFR, EST AFRICAN AMERICAN: 42 mL/min — AB (ref >60)
GLUCOSE: 117 mg/dL — AB (ref 65–99)
POTASSIUM: 2.8 mmol/L — AB (ref 3.5–5.1)
SODIUM: 141 mmol/L (ref 135–145)
Total Bilirubin: 1.6 mg/dL — ABNORMAL HIGH (ref 0.3–1.2)
Total Protein: 6.2 g/dL — ABNORMAL LOW (ref 6.5–8.1)

## 2016-04-15 LAB — CULTURE, RESPIRATORY: CULTURE: NORMAL

## 2016-04-15 LAB — CBC WITH DIFFERENTIAL/PLATELET
BASOS ABS: 0 10*3/uL (ref 0.0–0.1)
BASOS PCT: 0 %
EOS PCT: 0 %
Eosinophils Absolute: 0 10*3/uL (ref 0.0–0.7)
HCT: 38.7 % — ABNORMAL LOW (ref 39.0–52.0)
Hemoglobin: 12.6 g/dL — ABNORMAL LOW (ref 13.0–17.0)
LYMPHS PCT: 10 %
Lymphs Abs: 0.8 10*3/uL (ref 0.7–4.0)
MCH: 29 pg (ref 26.0–34.0)
MCHC: 32.6 g/dL (ref 30.0–36.0)
MCV: 89.2 fL (ref 78.0–100.0)
Monocytes Absolute: 0.7 10*3/uL (ref 0.1–1.0)
Monocytes Relative: 9 %
NEUTROS ABS: 6.2 10*3/uL (ref 1.7–7.7)
Neutrophils Relative %: 81 %
PLATELETS: 150 10*3/uL (ref 150–400)
RBC: 4.34 MIL/uL (ref 4.22–5.81)
RDW: 17 % — AB (ref 11.5–15.5)
WBC: 7.7 10*3/uL (ref 4.0–10.5)

## 2016-04-15 LAB — MAGNESIUM: Magnesium: 1 mg/dL — ABNORMAL LOW (ref 1.7–2.4)

## 2016-04-15 LAB — B. BURGDORFI ANTIBODIES: B burgdorferi Ab IgG+IgM: <0.91 {ISR} (ref 0.00–0.90)

## 2016-04-15 LAB — MISC LABCORP TEST (SEND OUT): Labcorp test code: 9985

## 2016-04-15 LAB — ROCKY MTN SPOTTED FVR ABS PNL(IGG+IGM)
RMSF IGG: NEGATIVE
RMSF IgM: 0.62 index (ref 0.00–0.89)

## 2016-04-15 LAB — CULTURE, RESPIRATORY W GRAM STAIN

## 2016-04-15 LAB — PHOSPHORUS: PHOSPHORUS: 2.8 mg/dL (ref 2.5–4.6)

## 2016-04-15 MED ORDER — VITAMIN B-1 100 MG PO TABS
100.0000 mg | ORAL_TABLET | Freq: Every day | ORAL | Status: DC
  Administered 2016-04-15 – 2016-04-18 (×4): 100 mg via ORAL
  Filled 2016-04-15 (×4): qty 1

## 2016-04-15 MED ORDER — ACETAMINOPHEN 325 MG PO TABS
650.0000 mg | ORAL_TABLET | Freq: Four times a day (QID) | ORAL | Status: DC | PRN
  Administered 2016-04-15 (×2): 650 mg via ORAL
  Filled 2016-04-15 (×2): qty 2

## 2016-04-15 MED ORDER — POTASSIUM CHLORIDE CRYS ER 20 MEQ PO TBCR
40.0000 meq | EXTENDED_RELEASE_TABLET | ORAL | Status: AC
  Administered 2016-04-15 (×2): 40 meq via ORAL
  Filled 2016-04-15 (×2): qty 2

## 2016-04-15 MED ORDER — LEVOFLOXACIN 750 MG PO TABS
750.0000 mg | ORAL_TABLET | ORAL | Status: AC
  Administered 2016-04-16: 750 mg via ORAL
  Filled 2016-04-15: qty 1

## 2016-04-15 MED ORDER — MAGNESIUM SULFATE 2 GM/50ML IV SOLN
2.0000 g | Freq: Once | INTRAVENOUS | Status: AC
  Administered 2016-04-15: 2 g via INTRAVENOUS
  Filled 2016-04-15: qty 50

## 2016-04-15 MED ORDER — ADULT MULTIVITAMIN W/MINERALS CH
1.0000 | ORAL_TABLET | ORAL | Status: DC
  Administered 2016-04-15 – 2016-04-17 (×3): 1 via ORAL
  Filled 2016-04-15 (×3): qty 1

## 2016-04-15 MED ORDER — FOLIC ACID 1 MG PO TABS
1.0000 mg | ORAL_TABLET | Freq: Every day | ORAL | Status: DC
  Administered 2016-04-15 – 2016-04-18 (×4): 1 mg via ORAL
  Filled 2016-04-15 (×4): qty 1

## 2016-04-15 NOTE — Progress Notes (Addendum)
PROGRESS NOTE    Tony Patterson  XQJ:[id number] DOB: 1955-11-10 DOA: 04/12/2016 PCP: Asencion Noble, MD     Brief Narrative:  Tony Patterson is a 60 y/o M with PMH of anemia, anxiety, seizures (last in 2015), neuropathy, and prior ETOH abuse who presented to [facility] on 11/10 after being found altered at home. Patient's nephew reported he entered the patient's room and found him lying on the floor with blood from his mouth and left hand - family thought he was postictal.  Family also reported he wasn't as responsive as usual the day prior to admit and had fever. ER exam notes him to be febrile to 104, hypertensive, tachypneic, altered and no visualized rashes, no nuchal regidity. During lab draw, he had a witnessed seizure and he was treated with ativan.  Due to poor mental status and concern for airway protection, he was intubated. CT of the head assessed and negative for acute process. He was treated with IVF and empiric broad spectrum antibiotics and transferred to [facility] ICU. Infectious disease was consulted. Patient underwent LP which was negative for infectious process. Neurology was consulted. Patient underwent EEG which showed no seizure or seizure predisposition. Patient was eventually extubated and transferred under [facility] care.    Assessment & Plan:   Principal Problem:   Alcohol withdrawal seizure ([place]) Active Problems:   Altered mental status   Rhabdomyolysis   Hypertension   Dyslipidemia   Chronic kidney disease   Elevated liver enzymes   Depression   Anxiety   Acute respiratory failure with hypoxia (HCC)  Altered mental status - improved  -Initially concerned for CNS infection. LP did not reveal evidence of meningitis. CSF culture negative. No HSV. Rocky mtn spotted Ab, Lyme Ab and PCR pending, likely no significance to acute illness. HIV Ab pending   -Most likely alcohol withdrawal seizure. Treated with keppra initially, stopped now  -Appreciate ID, Neuro -CIWA  -PT/OT  recommending CIR. Rehab consulted today  AKI - improving  -Secondary to rhabdomyolysis -Baseline Cr 1.3-1.4 -UA negative for infection  -IVF   Hypokalemia and hypomagnesemia -Replace today -Trend   Elevated troponin -Peaked 0.96, trended down -No chest pain   Elevated liver enzymes  -Trend, improving with IVF   Coag neg staph in blood culture -Contaminant  LLL pneumonia -Treated with levaquin, last day 11/14    DVT prophylaxis: subq hep Code Status: full  Family Communication: family at bedside Disposition Plan: CIR evaluation    Consultants:   PCCM  Infectious disease  Neurology  Procedures:   Intubated in ED 11/10. Extubated 11/11.   EEG 11/10: This EEG is consistent with sedation effect. There was no seizure or seizure predisposition recorded on this study. Please note that a normal EEG does not preclude the possibility of epilepsy.   LP 11/10: did not reveal evidence of meningitis  Antimicrobials:   Aztreonam 11/10 - stopped per ID   Vanco 11/10 - 11/12 stopped    Levaquin 11/10 - 11/14 last day    Subjective: Patient feeling well today. No complaints. Denies any headaches, chest pain, abdominal pain, nausea and vomiting. Tolerating breakfast. Continues to be very weak.   Objective: Vitals:   04/14/16 1500 04/14/16 1613 04/14/16 2113 04/15/16 0456  BP: (!) 176/101 (!) 166/104 (!) 160/94 (!) 171/99  Pulse: (!) 113 (!) 114 (!) 114 (!) 103  Resp: 17 (!) _0 Temp:  98.8 F (37.1 C) 98.5 F (36.9 C) 98.3 F (36.8 C)  TempSrc:  Oral Oral Oral  SpO2: 93% 99% 99% 98%  Weight:    92.1 kg (203 lb 0.7 oz)  Height:        Intake/Output Summary (Last 24 hours) at 04/15/16 1024 Last data filed at 04/15/16 0645  Gross per 24 hour  Intake          1603.75 ml  Output             2085 ml  Net          -481.25 ml   Filed Weights   04/13/16 0500 04/14/16 0339 04/15/16 0456  Weight: 95.2 kg (209 lb 14.1 oz) 91.1 kg (200 lb 13.4 oz) 92.1 kg  (203 lb 0.7 oz)    Examination:  General exam: Appears calm and comfortable  Respiratory system: Clear to auscultation. Respiratory effort normal. Cardiovascular system: S1 & S2 heard, tachycardic, regular rhythm. No JVD, murmurs, rubs, gallops or clicks. No pedal edema. Gastrointestinal system: Abdomen is nondistended, soft and nontender. No organomegaly or masses felt. Normal bowel sounds heard. Central nervous system: Alert and oriented. No focal neurological deficits. Extremities: Upper extremities 5/5 in strength, lower extremities 3/5 bilaterally  Skin: No rashes, lesions or ulcers Psychiatry: Judgement and insight appear normal. Mood & affect appropriate.   Data Reviewed: I have personally reviewed following labs and imaging studies  CBC:  Recent Labs Lab 04/12/16 1051 04/12/16 1536 04/12/16 1550 04/13/16 0656 04/14/16 0219 04/15/16 0829  WBC 10.3 8.4  --  7.0 9.5 7.7  NEUTROABS 8.9*  --  7.0  --   --  6.2  HGB 13.5 11.6*  --  11.8* 12.7* 12.6*  HCT 41.8 36.0*  --  36.3* 38.7* 38.7*  MCV 89.7 89.6  --  90.1 89.0 89.2  PLT 189 146*  --  132* 136* 161   Basic Metabolic Panel:  Recent Labs Lab 04/12/16 1051 04/12/16 1536 04/13/16 0656 04/14/16 0219 04/15/16 0829  NA 138  --  141 142 141  K 4.0  --  3.3* 3.7 2.8*  CL 99*  --  102 102 101  CO2 22  --  _0 GLUCOSE 136*  --  102* 81 117*  BUN 19  --  _1 CREATININE 2.04* 1.99* 2.38* 2.23* 1.92*  CALCIUM 9.1  --  7.5* 8.3* 7.9*  MG  --   --  1.2*  --  1.0*  PHOS  --   --  4.2  --  2.8   GFR: Estimated Creatinine Clearance: 49.5 mL/min (by C-G formula based on SCr of 1.92 mg/dL (H)). Liver Function Tests:  Recent Labs Lab 04/12/16 1051 04/13/16 0656 04/14/16 0219 04/15/16 0829  AST 344* 290* 362* 187*  ALT 68* 71* 110* 93*  ALKPHOS 71 53 63 59  BILITOT 2.1* 1.0 1.2 1.6*  PROT 7.7 5.5* 6.3* 6.2*  ALBUMIN 4.0 2.6* 3.0* 2.8*    Recent Labs Lab 04/12/16 1051  LIPASE 22   No results for  input(s): AMMONIA in the last 168 hours. Coagulation Profile:  Recent Labs Lab 04/12/16 1051  INR 1.02   Cardiac Enzymes:  Recent Labs Lab 04/12/16 1052 04/12/16 1536 04/12/16 1959 04/13/16 0012 04/13/16 0656  CKTOTAL 39,251* 42,653* 40,016*  --  26,145*  TROPONINI  --  0.96* 0.68* 0.61* 0.33*   BNP (last 3 results) No results for input(s): PROBNP in the last 8760 hours. HbA1C: No results for input(s): HGBA1C in the last 72 hours. CBG:  Recent Labs Lab 04/12/16 1108  04/13/16 1540  GLUCAP 142* 71   Lipid Profile:  Recent Labs  04/12/16 1547  TRIG 150*   Thyroid Function Tests: No results for input(s): TSH, T4TOTAL, FREET4, T3FREE, THYROIDAB in the last 72 hours. Anemia Panel: No results for input(s): VITAMINB12, FOLATE, FERRITIN, TIBC, IRON, RETICCTPCT in the last 72 hours. Sepsis Labs:  Recent Labs Lab 04/12/16 1102 04/12/16 1536 04/12/16 1959  LATICACIDVEN 3.54* 1.7 1.4    Recent Results (from the past 240 hour(s))  Urine culture     Status: None   Collection Time: 04/12/16 10:46 AM  Result Value Ref Range Status   Specimen Description URINE, RANDOM  Final   Special Requests NONE  Final   Culture NO GROWTH Performed at [facility]   Final   Report Status 04/14/2016 FINAL  Final  Blood Culture (routine x 2)     Status: Abnormal (Preliminary result)   Collection Time: 04/12/16 10:52 AM  Result Value Ref Range Status   Specimen Description BLOOD BLOOD LEFT ARM  Final   Special Requests BOTTLES DRAWN AEROBIC AND ANAEROBIC 6cc each  Final   Culture  Setup Time   Final    GRAM POSITIVE COCCI Gram Stain Report Called to,Read Back By and Verified With: TUTTLE S. AT [facility] ON [id number] AT 0710A BY THOMPSON S. IN BOTH AEROBIC AND ANAEROBIC BOTTLES CRITICAL RESULT CALLED TO, READ BACK BY AND VERIFIED WITH: M MACCIA,PHARMD AT 1003 04/13/16 BY L BENFIELD    Culture (A)  Final    STAPHYLOCOCCUS SPECIES (COAGULASE NEGATIVE) CULTURE REINCUBATED FOR BETTER  GROWTH Performed at [facility]    Report Status PENDING  Incomplete  Blood Culture ID Panel (Reflexed)     Status: Abnormal   Collection Time: 04/12/16 10:52 AM  Result Value Ref Range Status   Enterococcus species NOT DETECTED NOT DETECTED Final   Listeria monocytogenes NOT DETECTED NOT DETECTED Final   Staphylococcus species DETECTED (A) NOT DETECTED Final    Comment: CRITICAL RESULT CALLED TO, READ BACK BY AND VERIFIED WITH: M MACCIA,PHARMD AT 1003 04/13/16 BY L BENFIELD    Staphylococcus aureus NOT DETECTED NOT DETECTED Final   Methicillin resistance NOT DETECTED NOT DETECTED Final   Streptococcus species NOT DETECTED NOT DETECTED Final   Streptococcus agalactiae NOT DETECTED NOT DETECTED Final   Streptococcus pneumoniae NOT DETECTED NOT DETECTED Final   Streptococcus pyogenes NOT DETECTED NOT DETECTED Final   Acinetobacter baumannii NOT DETECTED NOT DETECTED Final   Enterobacteriaceae species NOT DETECTED NOT DETECTED Final   Enterobacter cloacae complex NOT DETECTED NOT DETECTED Final   Escherichia coli NOT DETECTED NOT DETECTED Final   Klebsiella oxytoca NOT DETECTED NOT DETECTED Final   Klebsiella pneumoniae NOT DETECTED NOT DETECTED Final   Proteus species NOT DETECTED NOT DETECTED Final   Serratia marcescens NOT DETECTED NOT DETECTED Final   Haemophilus influenzae NOT DETECTED NOT DETECTED Final   Neisseria meningitidis NOT DETECTED NOT DETECTED Final   Pseudomonas aeruginosa NOT DETECTED NOT DETECTED Final   Candida albicans NOT DETECTED NOT DETECTED Final   Candida glabrata NOT DETECTED NOT DETECTED Final   Candida krusei NOT DETECTED NOT DETECTED Final   Candida parapsilosis NOT DETECTED NOT DETECTED Final   Candida tropicalis NOT DETECTED NOT DETECTED Final    Comment: Performed at [facility]  Blood Culture (routine x 2)     Status: None (Preliminary result)   Collection Time: 04/12/16 11:07 AM  Result Value Ref Range Status   Specimen  Description BLOOD RIGHT  ANTECUBITAL  Final   Special Requests BOTTLES DRAWN AEROBIC AND ANAEROBIC 6cc each  Final   Culture NO GROWTH 3 DAYS  Final   Report Status PENDING  Incomplete  Culture, respiratory (NON-Expectorated)     Status: None (Preliminary result)   Collection Time: 04/12/16  5:06 PM  Result Value Ref Range Status   Specimen Description TRACHEAL ASPIRATE  Final   Special Requests NONE  Final   Gram Stain   Final    MODERATE WBC PRESENT, PREDOMINANTLY PMN FEW GRAM NEGATIVE RODS RARE GRAM POSITIVE COCCI IN PAIRS AND CHAINS RARE GRAM POSITIVE COCCI IN CLUSTERS RARE GRAM NEGATIVE DIPLOCOCCI    Culture CULTURE REINCUBATED FOR BETTER GROWTH  Final   Report Status PENDING  Incomplete  CSF culture     Status: None (Preliminary result)   Collection Time: 04/12/16  5:43 PM  Result Value Ref Range Status   Specimen Description CSF  Final   Special Requests NONE  Final   Gram Stain   Final    WBC PRESENT, PREDOMINANTLY MONONUCLEAR NO ORGANISMS SEEN CYTOSPIN SMEAR    Culture NO GROWTH 3 DAYS  Final   Report Status PENDING  Incomplete  MRSA PCR Screening     Status: None   Collection Time: 04/12/16  5:44 PM  Result Value Ref Range Status   MRSA by PCR NEGATIVE NEGATIVE Final    Comment:        The GeneXpert MRSA Assay (FDA approved for NASAL specimens only), is one component of a comprehensive MRSA colonization surveillance program. It is not intended to diagnose MRSA infection nor to guide or monitor treatment for MRSA infections.        Radiology Studies: No results found.    Scheduled Meds: . chlorhexidine gluconate (MEDLINE KIT)  15 mL Mouth Rinse BID  . folic acid  1 mg Oral Daily  . heparin  5,000 Units Subcutaneous Q8H  . [START ON 04/16/2016] levofloxacin  750 mg Oral Q48H  . magnesium sulfate 1 - 4 g bolus IVPB  2 g Intravenous Once  . mouth rinse  15 mL Mouth Rinse QID  . metoprolol tartrate  50 mg Oral BID  . multivitamin with minerals  1  tablet Oral Q24H  . pantoprazole  40 mg Oral Daily  . potassium chloride  40 mEq Oral Q4H  . thiamine  100 mg Oral Daily   Continuous Infusions: . sodium chloride 75 mL/hr (04/14/16 1145)     LOS: 3 days    Time spent: 40 minutes   Dessa Phi, DO [facility] [email or web address] Password [email or web address] 04/15/2016, 10:24 AM

## 2016-04-15 NOTE — Progress Notes (Signed)
Foley removed. DTV [id number]. Condom catheter placed.

## 2016-04-15 NOTE — Evaluation (Signed)
Physical Therapy Evaluation Patient Details Name: Tony RutterRobert H Patterson MRN: [id number] DOB: 05-25-1956 Today's Date: 04/15/2016   History of Present Illness  pt presents with Etoh Withdrawal and Seizure activity.  pt intubated 11/10 - 11/11.  pt with hx of Etoh, Anxiety, HTN, Seizures, Neuropathies, and Anemia.    Clinical Impression  Pt generally weak and debilitated.  Pt needs A for all aspects of mobility and was unable to ambulate at this time.  Pt's HR up to 120s during mobility.  Feel pt would benefit from CIR level of therapies at D/C to maximize independence and improve quality of life.  Will continue to follow.      Follow Up Recommendations CIR    Equipment Recommendations  None recommended by PT    Recommendations for Other Services Rehab consult     Precautions / Restrictions Precautions Precautions: Fall Restrictions Weight Bearing Restrictions: No      Mobility  Bed Mobility Overal bed mobility: Needs Assistance Bed Mobility: Supine to Sit;Sit to Supine     Supine to sit: Mod assist;HOB elevated Sit to supine: Max assist   General bed mobility comments: A with bringing trunk to sitting and movement of L LE to EOB.  Increased A for returning to bed.    Transfers Overall transfer level: Needs assistance Equipment used: Rolling walker (2 wheeled) Transfers: Sit to/from Stand Sit to Stand: Max assist         General transfer comment: pt needs cues for UE use and A for power up to standing and returning to sitting.  pt indicates dizziness in standing, which does not improve with increased time standing.    Ambulation/Gait                Stairs            Wheelchair Mobility    Modified Rankin (Stroke Patients Only)       Balance Overall balance assessment: Needs assistance Sitting-balance support: Bilateral upper extremity supported;Feet supported Sitting balance-Leahy Scale: Poor Sitting balance - Comments: pt remains flexed and has  difficulty maintaining balance.     Standing balance support: Bilateral upper extremity supported;During functional activity Standing balance-Leahy Scale: Poor                               Pertinent Vitals/Pain Pain Assessment: Faces Faces Pain Scale: Hurts even more Pain Location: Shoulders, Back, and bil LEs.   Pain Descriptors / Indicators: Grimacing;Guarding;Sore Pain Intervention(s): Monitored during session;Premedicated before session;Repositioned    Home Living Family/patient expects to be discharged to:: Private residence Living Arrangements: Alone Available Help at Discharge: Family (Nephew 8am - 8pm) Type of Home: House Home Access: Stairs to enter Entrance Stairs-Rails: None (per nephew pt holds on to brick facsade.) Entrance Stairs-Number of Steps: 3 Home Layout: One level Home Equipment: Cane - single point Additional Comments: Home set-up per nephew as pt giving incorrect answers.      Prior Function Level of Independence: Needs assistance   Gait / Transfers Assistance Needed: Per nephew pt occasionally uses a cane and has balance deficits due to neuropathies.    ADL's / Homemaking Assistance Needed: Nephew present and sets pt up for ADLs.  Nephew performs homemaking tasks and takes pt to store and appointments.    Comments: pt stated he does everything for himself, but nephew corrects pt.       Hand Dominance        Extremity/Trunk  Assessment   Upper Extremity Assessment: Defer to OT evaluation           Lower Extremity Assessment: Generalized weakness;RLE deficits/detail;LLE deficits/detail RLE Deficits / Details: Strength grossly 3/5, though limited by pain in bil LEs.  Sensation intact, though pt indicates neuropathies cause occasional numbness in foot and ankle.   LLE Deficits / Details: Strength grossly 3/5, but limited by pain.  pt indicates sensation intact, but hx of neuropathies causing occasional numbness in foot and ankle.     Cervical / Trunk Assessment: Kyphotic  Communication   Communication: No difficulties  Cognition Arousal/Alertness: Awake/alert Behavior During Therapy: WFL for tasks assessed/performed Overall Cognitive Status: Impaired/Different from baseline Area of Impairment: Orientation;Attention;Memory;Following commands;Safety/judgement;Awareness;Problem solving Orientation Level: Disoriented to;Situation;Time Current Attention Level: Selective Memory: Decreased short-term memory Following Commands: Follows one step commands consistently;Follows multi-step commands with increased time Safety/Judgement: Decreased awareness of safety;Decreased awareness of deficits Awareness: Intellectual Problem Solving: Slow processing;Decreased initiation;Requires verbal cues;Requires tactile cues General Comments: Per nephew pt not oriented to location earlier, but at this time pt retaining location information nephew gave him earlier.  pt inaccurate with home set-up and PLOF information.      General Comments      Exercises     Assessment/Plan    PT Assessment Patient needs continued PT services  PT Problem List Decreased strength;Decreased activity tolerance;Decreased balance;Decreased mobility;Decreased coordination;Decreased cognition;Decreased knowledge of use of DME;Decreased safety awareness;Impaired sensation;Pain          PT Treatment Interventions DME instruction;Gait training;Stair training;Functional mobility training;Therapeutic activities;Therapeutic exercise;Balance training;Cognitive remediation;Patient/family education    PT Goals (Current goals can be found in the Care Plan section)  Acute Rehab PT Goals Patient Stated Goal: Get stronger. PT Goal Formulation: With patient/family Time For Goal Achievement: 04/29/16 Potential to Achieve Goals: Good    Frequency Min 3X/week   Barriers to discharge        Co-evaluation               End of Session Equipment Utilized  During Treatment: Gait belt Activity Tolerance: Patient limited by fatigue Patient left: in bed;with call bell/phone within reach;with bed alarm set;with family/visitor present Nurse Communication: Mobility status         Time: [id number] PT Time Calculation (min) (ACUTE ONLY): 28 min   Charges:   PT Evaluation $PT Eval Moderate Complexity: 1 Procedure PT Treatments $Therapeutic Activity: 8-22 mins   PT G CodesSunny Schlein:        Tony Patterson F, [place]PT [phone] 04/15/2016, 10:02 AM

## 2016-04-15 NOTE — Consult Note (Signed)
Physical Medicine and Rehabilitation Consult  Reason for Consult: Rhabdomyolysis and encephalopathy due to ETOH withdrawal seizures.  Referring Physician:  Dr. Alvino Chapelhoi.   HPI: Tony Patterson is a 60 y.o. male with history of ETOH abuse, neuropathy, ETOH withdrawal seizures who was found down unresponsive with blood from his mouth on 04/12/16. History per chart review and family.  Family reported patient being confused and slow the day PTA but felt it was due to depression as he had recently put his dog to sleep. He was febrile in ED with T-104 with evidence of rhabdomyolysis CK- 39,2514 and AKI.  He had witnessed seizure in ED and was treated with ativan and intubated for airway protection.  CT head negative and he was started on IVF as well as broad spectrum antibiotics. Dr. Orvan Falconerampbell and recommended narrowing antibiotics to vancomycin and Levofloxacin pending work up. Patient tolerated extubation 11/11 without difficulty and has had improvement in mentation.  EEG consistent with sedation effect.  LP negative and patient afebrile. Dr. Orvan Falconerampbell recommended  5 day course of Levaquin for question of mild LLL PNA.  Dr. Amada JupiterKirkpatrick following for input and felt that work up consistent with ETOH delirium in setting of status epilepticus due to ETOH withdrawal. AKI improving and electrolyte abnormality being supplemented. Therapy evaluations done showing diffuse weakness, impulsivity with oral intake, difficulty processing affecting mobility and safety. CIR recommended by rehab team.   Unemployed since June due to ETOH issues. Used to work packing cigarettes. His nephew has been staying with him for past 6 months--provides supervision during the day and help with errands, drives to appts and meals, but is no longer planning on taking care of him at discharge. He has been attending outpatient PT for neuropathy due to gait disorder and shuffling gait with frequent falls ---twice a month per his preference.     Review of Systems  HENT: Negative for hearing loss and tinnitus.   Eyes: Negative for blurred vision and double vision.  Respiratory: Positive for cough. Negative for wheezing.   Cardiovascular: Negative for chest pain and leg swelling.  Gastrointestinal: Positive for diarrhea (chronic issues). Negative for constipation, heartburn and nausea.  Musculoskeletal: Positive for joint pain (Chronic right knee pain.  Right shoulder pain since fall. ), myalgias and neck pain.  Skin: Negative for itching and rash.  Neurological: Positive for sensory change (BLE from thighs down.) and focal weakness (right shoulder). Negative for dizziness and headaches.  Psychiatric/Behavioral: Positive for memory loss.  All other systems reviewed and are negative.     Past Medical History:  Diagnosis Date  . Alcohol abuse   . Anemia   . Anxiety   . Hypertension   . Neuropathy (HCC)   . Seizures (HCC)    unknown etiology and on no meds; been 4 years since seizure.    Past Surgical History:  Procedure Laterality Date  . COLONOSCOPY  05/18/2007   ZOX:[id number]:Internal hemorrhoids, a single anal papilla, otherwise normal/ Single polyp, as described above, removed   . COLONOSCOPY WITH PROPOFOL N/A 03/16/2015   Procedure: COLONOSCOPY WITH PROPOFOL;  Surgeon: Corbin Adeobert M Rourk, MD;  Location: [facility];  Service: Endoscopy;  Laterality: N/A;  in cecum at 1344. withdrawal time  8min  . THROAT SURGERY     polyps removed x2    Family History  Problem Relation Age of Onset  . Fibromyalgia Mother   . Dementia Mother   . Congestive Heart Failure Mother   . Congestive Heart Failure  Father   . Heart Problems Father   . Hypertension Brother   . Hypertension Brother   . Neuropathy Neg Hx   . Colon cancer Neg Hx     Social History:   Nephew stay with patient.  Per reports that he quit smoking about 20 years ago. His smoking use included Cigarettes. He has a 10.00 pack-year smoking history. He has never used smokeless  tobacco. Per reports that he drinks about 18.0 oz of alcohol per week . Per  reports that he does not use drugs.   Allergies  Allergen Reactions  . Bee Venom Anaphylaxis  . Penicillins Other (See Comments)    Childhood allergic reaction - no other information available    Medications Prior to Admission  Medication Sig Dispense Refill  . Aspirin-Salicylamide-Caffeine (BC HEADACHE POWDER PO) Take 1 packet by mouth 2 (two) times daily as needed (knee pain).    Marland Kitchen. atorvastatin (LIPITOR) 40 MG tablet Take 40 mg by mouth at bedtime.    Marland Kitchen. b complex vitamins capsule Take 1 capsule by mouth 2 (two) times daily. Neurbion Forte (Vitamin B1, B6, B12)    . cilostazol (PLETAL) 50 MG tablet Take 50 mg by mouth 2 (two) times daily.    Marland Kitchen. gabapentin (NEURONTIN) 600 MG tablet Take 1 tablet (600 mg total) by mouth 3 (three) times daily. (Patient taking differently: Take 600-1,200 mg by mouth See admin instructions. Take 1 tablet (600 mg) by mouth every morning and 2 tablets (1200 mg) at bedtime) 90 tablet 12  . LORazepam (ATIVAN) 0.5 MG tablet Take 0.5 mg by mouth at bedtime as needed (tremors/ withdrawal symptoms).     . magnesium oxide (MAG-OX) 400 MG tablet Take 400 mg by mouth at bedtime.    . metoprolol (LOPRESSOR) 50 MG tablet Take 50 mg by mouth 2 (two) times daily.     . Naphazoline HCl (CLEAR EYES OP) Place 1 drop into both eyes daily.    Marland Kitchen. omeprazole (PRILOSEC) 20 MG capsule Take 20 mg by mouth daily.     . tamsulosin (FLOMAX) 0.4 MG CAPS capsule Take 0.4 mg by mouth daily.    . Gabapentin, Once-Daily, 300 MG TABS Take 900 mg by mouth 2 (two) times daily as needed. (Patient not taking: Reported on 04/13/2016) 180 tablet 12    Home: Home Living Family/patient expects to be discharged to:: Private residence Living Arrangements: Alone Available Help at Discharge: Family (Nephew 8am - 8pm) Type of Home: House Home Access: Stairs to enter Entergy CorporationEntrance Stairs-Number of Steps: 3 Entrance Stairs-Rails:  None (per nephew pt holds on to brick facsade.) Home Layout: One level Home Equipment: Cane - single point Additional Comments: Home set-up per nephew as pt giving incorrect answers.    Functional History: Prior Function Level of Independence: Needs assistance Gait / Transfers Assistance Needed: Per nephew pt occasionally uses a cane and has balance deficits due to neuropathies.   ADL's / Homemaking Assistance Needed: Nephew present and sets pt up for ADLs.  Nephew performs homemaking tasks and takes pt to store and appointments.   Comments: pt stated he does everything for himself, but nephew corrects pt.   Functional Status:  Mobility: Bed Mobility Overal bed mobility: Needs Assistance Bed Mobility: Supine to Sit, Sit to Supine Supine to sit: Mod assist, HOB elevated Sit to supine: Max assist General bed mobility comments: A with bringing trunk to sitting and movement of L LE to EOB.  Increased A for returning to bed.   Transfers  Overall transfer level: Needs assistance Equipment used: Rolling walker (2 wheeled) Transfers: Sit to/from Stand Sit to Stand: Max assist General transfer comment: pt needs cues for UE use and A for power up to standing and returning to sitting.  pt indicates dizziness in standing, which does not improve with increased time standing.        ADL:    Cognition: Cognition Overall Cognitive Status: Impaired/Different from baseline Orientation Level: Oriented to person, Disoriented to place, Disoriented to time, Disoriented to situation Cognition Arousal/Alertness: Awake/alert Behavior During Therapy: WFL for tasks assessed/performed Overall Cognitive Status: Impaired/Different from baseline Area of Impairment: Orientation, Attention, Memory, Following commands, Safety/judgement, Awareness, Problem solving Orientation Level: Disoriented to, Situation, Time Current Attention Level: Selective Memory: Decreased short-term memory Following Commands:  Follows one step commands consistently, Follows multi-step commands with increased time Safety/Judgement: Decreased awareness of safety, Decreased awareness of deficits Awareness: Intellectual Problem Solving: Slow processing, Decreased initiation, Requires verbal cues, Requires tactile cues General Comments: Per nephew pt not oriented to location earlier, but at this time pt retaining location information nephew gave him earlier.  pt inaccurate with home set-up and PLOF information.    Blood pressure (!) 171/99, pulse (!) 103, temperature 98.3 F (36.8 C), temperature source Oral, resp. rate 18, height 6\' 3"  (1.905 m), weight 92.1 kg (203 lb 0.7 oz), SpO2 98 %. Physical Exam  Nursing note and vitals reviewed. Constitutional: He appears well-developed and well-nourished.  Flushed appearing male lying slumped to the left. Unable to reposition himself.   HENT:  Head: Normocephalic and atraumatic.  Mouth/Throat: Oropharynx is clear and moist.  Eyes: Pupils are equal, round, and reactive to light.  Injected scleral and conjunctiva  Neck: Normal range of motion. Neck supple.  Cardiovascular: Regular rhythm.   Occasional extrasystoles are present.  Tachycardia  Respiratory: Effort normal and breath sounds normal. No stridor. No respiratory distress.  GI: Soft. Bowel sounds are normal. He exhibits no distension. There is no tenderness.  Musculoskeletal: He exhibits no edema or tenderness.  Neurological: He is alert.  Delayed answers  Alert and oriented to person and place.  He was able to follow simple motor commands Motor: 4/5 throughout, except hip flexion 2/5. Sensation intact to light touch  Skin: Skin is warm and dry.  Psychiatric: His affect is inappropriate. His speech is delayed. He is slowed. Cognition and memory are impaired. He is inattentive.    Results for orders placed or performed during the hospital encounter of 04/12/16 (from the past 24 hour(s))  CBC with  Differential/Platelet     Status: Abnormal   Collection Time: 04/15/16  8:29 AM  Result Value Ref Range   WBC 7.7 4.0 - 10.5 K/uL   RBC 4.34 4.22 - 5.81 MIL/uL   Hemoglobin 12.6 (L) 13.0 - 17.0 g/dL   HCT 21.3 (L) 08.6 - 57.8 %   MCV 89.2 78.0 - 100.0 fL   MCH 29.0 26.0 - 34.0 pg   MCHC 32.6 30.0 - 36.0 g/dL   RDW 46.9 (H) 62.9 - 52.8 %   Platelets 150 150 - 400 K/uL   Neutrophils Relative % 81 %   Neutro Abs 6.2 1.7 - 7.7 K/uL   Lymphocytes Relative 10 %   Lymphs Abs 0.8 0.7 - 4.0 K/uL   Monocytes Relative 9 %   Monocytes Absolute 0.7 0.1 - 1.0 K/uL   Eosinophils Relative 0 %   Eosinophils Absolute 0.0 0.0 - 0.7 K/uL   Basophils Relative 0 %   Basophils  Absolute 0.0 0.0 - 0.1 K/uL  Magnesium     Status: Abnormal   Collection Time: 04/15/16  8:29 AM  Result Value Ref Range   Magnesium 1.0 (L) 1.7 - 2.4 mg/dL  Phosphorus     Status: None   Collection Time: 04/15/16  8:29 AM  Result Value Ref Range   Phosphorus 2.8 2.5 - 4.6 mg/dL  Comprehensive metabolic panel     Status: Abnormal   Collection Time: 04/15/16  8:29 AM  Result Value Ref Range   Sodium 141 135 - 145 mmol/L   Potassium 2.8 (L) 3.5 - 5.1 mmol/L   Chloride 101 101 - 111 mmol/L   CO2 25 22 - 32 mmol/L   Glucose, Bld 117 (H) 65 - 99 mg/dL   BUN 16 6 - 20 mg/dL   Creatinine, Ser 9.60 (H) 0.61 - 1.24 mg/dL   Calcium 7.9 (L) 8.9 - 10.3 mg/dL   Total Protein 6.2 (L) 6.5 - 8.1 g/dL   Albumin 2.8 (L) 3.5 - 5.0 g/dL   AST 454 (H) 15 - 41 U/L   ALT 93 (H) 17 - 63 U/L   Alkaline Phosphatase 59 38 - 126 U/L   Total Bilirubin 1.6 (H) 0.3 - 1.2 mg/dL   GFR calc non Af Amer 37 (L) >60 mL/min   GFR calc Af Amer 42 (L) >60 mL/min   Anion gap 15 5 - 15   No results found.  Assessment/Plan: Diagnosis: ETOH delirium Labs and images independently reviewed.  Records reviewed and summated above.  1. Does the need for close, 24 hr/day medical supervision in concert with the patient's rehab needs make it unreasonable for  this patient to be served in a less intensive setting? Potentially  2. Co-Morbidities requiring supervision/potential complications: ETOH abuse (CIWA, vitamin supplementation, counsel when appropriate), neuropathy (Likely Etoh related), rhabdomyolysis (cont to monitor Cr and CK), AKI (avoid nephrotoxic meds, cont hydration), PNA (cont abx), falls (cont therapies), HTN (monitor and provide prns in accordance with increased physical exertion and pain), hypokalemia (continue to monitor and replete as necessary), hypomagnesemia (cont to monitor, replete as necessary), Hypoalbuminemia (maximize nutrition for overall health), tranaminitis (Etoh abstinence), ABLA (transfuse if necessary to ensure appropriate perfusion for increased activity tolerance) 3. Due to bladder management, bowel management, safety, skin/wound care, disease management, medication administration and patient education, does the patient require 24 hr/day rehab nursing? Yes 4. Does the patient require coordinated care of a physician, rehab nurse, PT (1-2 hrs/day, 5 days/week), OT (1-2 hrs/day, 5 days/week) and SLP (1-2 hrs/day, 5 days/week) to address physical and functional deficits in the context of the above medical diagnosis(es)? Yes Addressing deficits in the following areas: balance, endurance, locomotion, strength, transferring, bowel/bladder control, bathing, dressing, grooming, toileting, cognition, swallowing and psychosocial support 5. Can the patient actively participate in an intensive therapy program of at least 3 hrs of therapy per day at least 5 days per week? Yes 6. The potential for patient to make measurable gains while on inpatient rehab is excellent 7. Anticipated functional outcomes upon discharge from inpatient rehab are modified independent and supervision  with PT, modified independent and supervision with OT, independent and modified independent with SLP. 8. Estimated rehab length of stay to reach the above functional  goals is: 12-16 days. 9. Does the patient have adequate social supports and living environment to accommodate these discharge functional goals? No 10. Anticipated D/C setting: SNF 11. Anticipated post D/C treatments: SNF 12. Overall Rehab/Functional Prognosis: good  RECOMMENDATIONS: This patient's  condition is appropriate for continued rehabilitative care in the following setting: Pt baseline functional gait deficits with superimposed cognitive deficits secondary to Etoh seizures.  Anticipate pt's cognitive status will cont to improve to baseline. Regarding functional deficits, recommend continued outpt therapy.  Per pt's nephew (current caretaker), he can no longer care for patient. If this is the case, recommend SNF. Patient has agreed to participate in recommended program. Potentially Note that insurance prior authorization may be required for reimbursement for recommended care.  Comment: Rehab Admissions Coordinator to follow up.  Maryla Morrow, MD, [place] [facility] 04/15/2016

## 2016-04-15 NOTE — Progress Notes (Signed)
Pharmacy Antibiotic Note Tony RutterRobert H Patterson is a 60 y.o. male admitted on 04/12/2016 with fever and seizures believed to be secondary to EtOH withdrawal. Continues on Levaquin for poss aspiration PNA. SCr and CK trending down but still elevated SCr 1.92, CK >20K as of 11/11. Remains afebrile, WBC WNL.   Plan: -Continue levaquin 750 q48h - to end 11/18 per MD -Monitor renal fxn/clinical course closely in the setting rhabdomyolysis and fluoroquinolone use   Height: 6\' 3"  (190.5 cm) Weight: 203 lb 0.7 oz (92.1 kg) IBW/kg (Calculated) : 84.5  Temp (24hrs), Avg:98.5 F (36.9 C), Min:98.2 F (36.8 C), Max:98.8 F (37.1 C)   Recent Labs Lab 04/12/16 1051 04/12/16 1102 04/12/16 1536 04/12/16 1959 04/13/16 0656 04/14/16 0219 04/15/16 0829  WBC 10.3  --  8.4  --  7.0 9.5 7.7  CREATININE 2.04*  --  1.99*  --  2.38* 2.23* 1.92*  LATICACIDVEN  --  3.54* 1.7 1.4  --   --   --     Estimated Creatinine Clearance: 49.5 mL/min (by C-G formula based on SCr of 1.92 mg/dL (H)).    Allergies  Allergen Reactions  . Bee Venom Anaphylaxis  . Penicillins Other (See Comments)    Childhood allergic reaction - no other information available   Antimicrobials this admission:  11/10 Levaquin  >> (11/18) 11/10 vancomycin >> 11/12 11/10 acyclovir 11/11 11/10 aztreonam 11/11  Dose adjustments this admission:  n/a  Microbiology results:  11/10 BCx: 1/2 gpc, CONS 11/10 UCx:  NGF 11/10 Sputum: few GNR, rare GPC chains, clusters, pairs, rare diplococci 11/10 CSF ngx2d 11/10 MRSA PCR: neg  Thank you for allowing pharmacy to be a part of this patient's care.  Sherle Poeob Mahima Hottle, PharmD Clinical Pharmacist 10:09 AM, 04/15/2016

## 2016-04-15 NOTE — Progress Notes (Signed)
Speech Language Pathology Treatment: Dysphagia  Patient Details Name: Tony RutterRobert H Patterson MRN: [id number] DOB: 05/06/1956 Today's Date: Patterson Time: 0940-1000 SLP Time Calculation (min) (ACUTE ONLY): 20 min  Assessment / Plan / Recommendation Clinical Impression  Pt observed with am meal. Observed to have impulsive intake with overlarge bites and rapid sips. Continuous drinking of thin liquids, did result in throat clearing, though no signs of aspiration with single large sips of water with pills. Pt also with delayed coughing following solids. Pt does have a history of GER, per nephew as well as a history of laryngeal cancer though unsure of treatment history. For now, will downgrade texture of foods to dys 2 and continue thin liquids. If coughing persists, will follow up with [facility] for objective assessment given risk of aspiration with delayed sensation contributing to cough.     HPI HPI: Pt is a 60 y.o. male with PMH of anemia, anxiety, seizures (last in 2015), neuropathy, and prior ETOH abuse [facility] after being found altered at home. Pt was found to be febrile, hypertensive, tachypneic, had witnessed seizure, and was intubated until 11/11. CXR today showed improving L basilar opacity. Bedside swallow eval ordered post-extubation.       SLP Plan  Continue with current plan of care     Recommendations  Diet recommendations: Thin liquid;Dysphagia 2 (fine chop) Liquids provided via: Cup;Straw Medication Administration: Whole meds with liquid Supervision: Patient able to self feed;Intermittent supervision to cue for compensatory strategies Compensations: Slow rate;Small sips/bites;Minimize environmental distractions Postural Changes and/or Swallow Maneuvers: Seated upright 90 degrees                Plan: Continue with current plan of care       GO               Essex Surgical LLCBonnie Clovis Warwick, MA CCC-SLP [phone]  Tony Patterson, Tony Patterson Tony Patterson, 10:30 AM

## 2016-04-15 NOTE — Progress Notes (Signed)
Rehab Admissions Coordinator Note:  Patient was screened by Trish MageLogue, Townsend Cudworth M for appropriateness for an Inpatient Acute Rehab Consult.  At this time, we are recommending Inpatient Rehab consult.  Trish MageLogue, Pamila Mendibles M 04/15/2016, 10:07 AM  I can be reached at [phone].

## 2016-04-15 NOTE — Progress Notes (Signed)
Rehab admissions - Please see rehab consult done by Dr. Allena KatzPatel recommending outpatient follow-up or SNF placement depending on caregiver support after discharge.  Call me for questions.  #[phone]

## 2016-04-16 ENCOUNTER — Ambulatory Visit: Payer: Managed Care, Other (non HMO) | Admitting: Physical Therapy

## 2016-04-16 ENCOUNTER — Inpatient Hospital Stay (HOSPITAL_COMMUNITY): Payer: Managed Care, Other (non HMO)

## 2016-04-16 DIAGNOSIS — F10230 Alcohol dependence with withdrawal, uncomplicated: Secondary | ICD-10-CM

## 2016-04-16 LAB — COMPREHENSIVE METABOLIC PANEL
ALBUMIN: 2.5 g/dL — AB (ref 3.5–5.0)
ALT: 75 U/L — AB (ref 17–63)
ANION GAP: 11 (ref 5–15)
AST: 104 U/L — ABNORMAL HIGH (ref 15–41)
Alkaline Phosphatase: 58 U/L (ref 38–126)
BUN: 15 mg/dL (ref 6–20)
CHLORIDE: 103 mmol/L (ref 101–111)
CO2: 24 mmol/L (ref 22–32)
Calcium: 7.9 mg/dL — ABNORMAL LOW (ref 8.9–10.3)
Creatinine, Ser: 1.9 mg/dL — ABNORMAL HIGH (ref 0.61–1.24)
GFR calc non Af Amer: 37 mL/min — ABNORMAL LOW (ref >60)
GFR, EST AFRICAN AMERICAN: 43 mL/min — AB (ref >60)
GLUCOSE: 122 mg/dL — AB (ref 65–99)
Potassium: 3.3 mmol/L — ABNORMAL LOW (ref 3.5–5.1)
SODIUM: 138 mmol/L (ref 135–145)
Total Bilirubin: 1.2 mg/dL (ref 0.3–1.2)
Total Protein: 5.9 g/dL — ABNORMAL LOW (ref 6.5–8.1)

## 2016-04-16 LAB — CSF CULTURE

## 2016-04-16 LAB — CK: CK TOTAL: 2224 U/L — AB (ref 49–397)

## 2016-04-16 LAB — MAGNESIUM: MAGNESIUM: 1.3 mg/dL — AB (ref 1.7–2.4)

## 2016-04-16 LAB — CSF CULTURE W GRAM STAIN: Culture: NO GROWTH

## 2016-04-16 MED ORDER — TAMSULOSIN HCL 0.4 MG PO CAPS
0.4000 mg | ORAL_CAPSULE | Freq: Every day | ORAL | Status: DC
  Administered 2016-04-16 – 2016-04-18 (×3): 0.4 mg via ORAL
  Filled 2016-04-16 (×3): qty 1

## 2016-04-16 MED ORDER — GABAPENTIN 600 MG PO TABS
1200.0000 mg | ORAL_TABLET | Freq: Every day | ORAL | Status: DC
  Administered 2016-04-16 – 2016-04-17 (×2): 1200 mg via ORAL
  Filled 2016-04-16 (×2): qty 2

## 2016-04-16 MED ORDER — POTASSIUM CHLORIDE CRYS ER 20 MEQ PO TBCR
40.0000 meq | EXTENDED_RELEASE_TABLET | ORAL | Status: AC
  Administered 2016-04-16 (×2): 40 meq via ORAL
  Filled 2016-04-16 (×2): qty 2

## 2016-04-16 MED ORDER — CILOSTAZOL 50 MG PO TABS
50.0000 mg | ORAL_TABLET | Freq: Two times a day (BID) | ORAL | Status: DC
  Administered 2016-04-16 – 2016-04-18 (×5): 50 mg via ORAL
  Filled 2016-04-16 (×5): qty 1

## 2016-04-16 MED ORDER — MAGNESIUM SULFATE 2 GM/50ML IV SOLN
2.0000 g | Freq: Once | INTRAVENOUS | Status: AC
  Administered 2016-04-16: 2 g via INTRAVENOUS
  Filled 2016-04-16: qty 50

## 2016-04-16 MED ORDER — OXYCODONE HCL 5 MG PO TABS
5.0000 mg | ORAL_TABLET | Freq: Once | ORAL | Status: DC
  Filled 2016-04-16: qty 1

## 2016-04-16 MED ORDER — METOPROLOL TARTRATE 50 MG PO TABS: 50.0000 mg | ORAL_TABLET | Freq: Two times a day (BID) | ORAL | Status: DC

## 2016-04-16 MED ORDER — GABAPENTIN 600 MG PO TABS
600.0000 mg | ORAL_TABLET | Freq: Every morning | ORAL | Status: DC
  Administered 2016-04-16 – 2016-04-18 (×3): 600 mg via ORAL
  Filled 2016-04-16 (×3): qty 1

## 2016-04-16 MED ORDER — ENSURE ENLIVE PO LIQD
237.0000 mL | Freq: Three times a day (TID) | ORAL | Status: DC
  Administered 2016-04-16 – 2016-04-18 (×6): 237 mL via ORAL

## 2016-04-16 MED ORDER — ATORVASTATIN CALCIUM 40 MG PO TABS
40.0000 mg | ORAL_TABLET | Freq: Every day | ORAL | Status: DC
  Administered 2016-04-16 – 2016-04-17 (×2): 40 mg via ORAL
  Filled 2016-04-16 (×2): qty 1

## 2016-04-16 NOTE — Progress Notes (Addendum)
PROGRESS NOTE    Tony Patterson  Tony Patterson DOB: 01/06/1956 DOA: 04/12/2016 PCP: Asencion Noble, MD     Brief Narrative:  Tony Patterson is a 60 y/o M with PMH of anemia, anxiety, seizures (last in 2015), neuropathy, and prior ETOH abuse who presented to [facility] on 11/10 after being found altered at home. Patient's nephew reported he entered the patient's room and found him lying on the floor with blood from his mouth and left hand - family thought he was postictal.  Family also reported he wasn't as responsive as usual the day prior to admit and had fever. ER exam notes him to be febrile to 104, hypertensive, tachypneic, altered and no visualized rashes, no nuchal regidity. During lab draw, he had a witnessed seizure and he was treated with ativan.  Due to poor mental status and concern for airway protection, he was intubated. CT of the head assessed and negative for acute process. He was treated with IVF and empiric broad spectrum antibiotics and transferred to [facility] ICU. Infectious disease was consulted. Patient underwent LP which was negative for infectious process. Neurology was consulted. Patient underwent EEG which showed no seizure or seizure predisposition. Patient was eventually extubated and transferred under [facility] care. It is felt that his seizure was secondary to alcohol withdrawal.    Assessment & Plan:   Principal Problem:   Alcohol withdrawal seizure ([place]) Active Problems:   Altered mental status   Rhabdomyolysis   Hypertension   Dyslipidemia   Chronic kidney disease   Elevated liver enzymes   Depression   Anxiety   Acute respiratory failure with hypoxia (HCC)   ETOH abuse   Alcoholic peripheral neuropathy (HCC)   Non-traumatic rhabdomyolysis   AKI (acute kidney injury) ([place])   Benign essential HTN   Fall   Community acquired pneumonia   Hypokalemia   Hypomagnesemia   Hypoalbuminemia due to protein-calorie malnutrition (HCC)   Transaminitis   Acute blood loss  anemia  Altered mental status - resolved  -Initially concerned for CNS infection. LP did not reveal evidence of meningitis. CSF culture negative. No HSV. Rocky mtn spotted Ab, Lyme Ab negative. HIV Ab pending   -Most likely alcohol withdrawal seizure. Treated with keppra initially, stopped now  -Appreciate ID, Neuro -PT/OT recommending CIR, evaluated and patient referred for SNF   AKI  -Secondary to rhabdomyolysis -Baseline Cr 1.3-1.4 -UA negative for infection  -Improving   Hypokalemia and hypomagnesemia -Replace today -Trend   Alcohol abuse -CIWA. Required ativan again this morning   Elevated troponin -Peaked 0.96, trended down -No chest pain   Elevated liver enzymes  -Trend, improving with IVF   Coag neg staph in blood culture -Contaminant  LLL pneumonia -Treated with levaquin, last day 11/14    HTN -Metoprolol BID   Chronic peripheral neuropathy -Restart Neurontin   DVT prophylaxis: subq hep Code Status: full  Family Communication: family at bedside Disposition Plan: SNF when stable and off CIWA. Undergoing modified barium swallow test today.   Consultants:   PCCM  Infectious disease  Neurology  Procedures:   Intubated in ED 11/10. Extubated 11/11.   EEG 11/10: This EEG is consistent with sedation effect. There was no seizure or seizure predisposition recorded on this study. Please note that a normal EEG does not preclude the possibility of epilepsy.   LP 11/10: did not reveal evidence of meningitis  Antimicrobials:   Aztreonam 11/10 - stopped per ID   Vanco 11/10 - 11/12    Levaquin  11/10 - 11/14    Subjective: Patient states he is not feeling well today. He has back pain and neuropathic pain. Gabapentin was restarted today. He otherwise denies any chest pain, shortness of breath.  Objective: Vitals:   04/16/16 0621 04/16/16 0815 04/16/16 0820 04/16/16 0900  BP: (!) 179/100 (!) 182/106 (!) 182/106 (!) 159/92  Pulse: (!) 102 (!) 124  (!) 120   Resp:      Temp:  99.6 F (37.6 C) 99.6 F (37.6 C)   TempSrc:  Oral Oral   SpO2: 95% 95% 95%   Weight:      Height:        Intake/Output Summary (Last 24 hours) at 04/16/16 1431 Last data filed at 04/16/16 0600  Gross per 24 hour  Intake                0 ml  Output              700 ml  Net             -700 ml   Filed Weights   04/13/16 0500 04/14/16 0339 04/15/16 0456  Weight: 95.2 kg (209 lb 14.1 oz) 91.1 kg (200 lb 13.4 oz) 92.1 kg (203 lb 0.7 oz)    Examination:  General exam: Appears calm and comfortable  Respiratory system: Clear to auscultation. Respiratory effort normal. Cardiovascular system: S1 & S2 heard, tachycardic, regular rhythm. No JVD, murmurs, rubs, gallops or clicks. No pedal edema. Gastrointestinal system: Abdomen is nondistended, soft and nontender. No organomegaly or masses felt. Normal bowel sounds heard. Central nervous system: Alert and oriented. No focal neurological deficits. Extremities: Upper extremities 5/5 in strength, lower extremities 4/5 bilaterally  Skin: No rashes, lesions or ulcers Psychiatry: Judgement and insight appear normal. Mood & affect appropriate.   Data Reviewed: I have personally reviewed following labs and imaging studies  CBC:  Recent Labs Lab 04/12/16 1051 04/12/16 1536 04/12/16 1550 04/13/16 0656 04/14/16 0219 04/15/16 0829  WBC 10.3 8.4  --  7.0 9.5 7.7  NEUTROABS 8.9*  --  7.0  --   --  6.2  HGB 13.5 11.6*  --  11.8* 12.7* 12.6*  HCT 41.8 36.0*  --  36.3* 38.7* 38.7*  MCV 89.7 89.6  --  90.1 89.0 89.2  PLT 189 146*  --  132* 136* 384   Basic Metabolic Panel:  Recent Labs Lab 04/12/16 1051 04/12/16 1536 04/13/16 0656 04/14/16 0219 04/15/16 0829 04/16/16 0528  NA 138  --  141 142 141 138  K 4.0  --  3.3* 3.7 2.8* 3.3*  CL 99*  --  102 102 101 103  CO2 22  --  26 24 25 24   GLUCOSE 136*  --  102* 81 117* 122*  BUN 19  --  19 18 16 15   CREATININE 2.04* 1.99* 2.38* 2.23* 1.92* 1.90*  CALCIUM  9.1  --  7.5* 8.3* 7.9* 7.9*  MG  --   --  1.2*  --  1.0* 1.3*  PHOS  --   --  4.2  --  2.8  --    GFR: Estimated Creatinine Clearance: 50 mL/min (by C-G formula based on SCr of 1.9 mg/dL (H)). Liver Function Tests:  Recent Labs Lab 04/12/16 1051 04/13/16 0656 04/14/16 0219 04/15/16 0829 04/16/16 0528  AST 344* 290* 362* 187* 104*  ALT 68* 71* 110* 93* 75*  ALKPHOS 71 53 63 59 58  BILITOT 2.1* 1.0 1.2 1.6* 1.2  PROT 7.7  5.5* 6.3* 6.2* 5.9*  ALBUMIN 4.0 2.6* 3.0* 2.8* 2.5*    Recent Labs Lab 04/12/16 1051  LIPASE 22   No results for input(s): AMMONIA in the last 168 hours. Coagulation Profile:  Recent Labs Lab 04/12/16 1051  INR 1.02   Cardiac Enzymes:  Recent Labs Lab 04/12/16 1052 04/12/16 1536 04/12/16 1959 04/13/16 0012 04/13/16 0656 04/16/16 0528  CKTOTAL 39,251* 42,653* 40,016*  --  26,145* 2,224*  TROPONINI  --  0.96* 0.68* 0.61* 0.33*  --    BNP (last 3 results) No results for input(s): PROBNP in the last 8760 hours. HbA1C: No results for input(s): HGBA1C in the last 72 hours. CBG:  Recent Labs Lab 04/12/16 1108 04/13/16 1540  GLUCAP 142* 71   Lipid Profile: No results for input(s): CHOL, HDL, LDLCALC, TRIG, CHOLHDL, LDLDIRECT in the last 72 hours. Thyroid Function Tests: No results for input(s): TSH, T4TOTAL, FREET4, T3FREE, THYROIDAB in the last 72 hours. Anemia Panel: No results for input(s): VITAMINB12, FOLATE, FERRITIN, TIBC, IRON, RETICCTPCT in the last 72 hours. Sepsis Labs:  Recent Labs Lab 04/12/16 1102 04/12/16 1536 04/12/16 1959  LATICACIDVEN 3.54* 1.7 1.4    Recent Results (from the past 240 hour(s))  Urine culture     Status: None   Collection Time: 04/12/16 10:46 AM  Result Value Ref Range Status   Specimen Description URINE, RANDOM  Final   Special Requests NONE  Final   Culture NO GROWTH Performed at [facility]   Final   Report Status 04/14/2016 FINAL  Final  Blood Culture (routine x 2)     Status:  Abnormal   Collection Time: 04/12/16 10:52 AM  Result Value Ref Range Status   Specimen Description BLOOD BLOOD LEFT ARM  Final   Special Requests BOTTLES DRAWN AEROBIC AND ANAEROBIC 6cc each  Final   Culture  Setup Time   Final    GRAM POSITIVE COCCI Gram Stain Report Called to,Read Back By and Verified With: TUTTLE S. AT [place] ON [id number] AT 0710A BY THOMPSON S. IN BOTH AEROBIC AND ANAEROBIC BOTTLES CRITICAL RESULT CALLED TO, READ BACK BY AND VERIFIED WITH: M MACCIA,PHARMD AT 1003 04/13/16 BY L BENFIELD    Culture (A)  Final    STAPHYLOCOCCUS SPECIES (COAGULASE NEGATIVE) THE SIGNIFICANCE OF ISOLATING THIS ORGANISM FROM A SINGLE SET OF BLOOD CULTURES WHEN MULTIPLE SETS ARE DRAWN IS UNCERTAIN. PLEASE NOTIFY THE MICROBIOLOGY DEPARTMENT WITHIN ONE WEEK IF SPECIATION AND SENSITIVITIES ARE REQUIRED. Performed at [facility]    Report Status 04/15/2016 FINAL  Final  Blood Culture ID Panel (Reflexed)     Status: Abnormal   Collection Time: 04/12/16 10:52 AM  Result Value Ref Range Status   Enterococcus species NOT DETECTED NOT DETECTED Final   Listeria monocytogenes NOT DETECTED NOT DETECTED Final   Staphylococcus species DETECTED (A) NOT DETECTED Final    Comment: CRITICAL RESULT CALLED TO, READ BACK BY AND VERIFIED WITH: M MACCIA,PHARMD AT 1003 04/13/16 BY L BENFIELD    Staphylococcus aureus NOT DETECTED NOT DETECTED Final   Methicillin resistance NOT DETECTED NOT DETECTED Final   Streptococcus species NOT DETECTED NOT DETECTED Final   Streptococcus agalactiae NOT DETECTED NOT DETECTED Final   Streptococcus pneumoniae NOT DETECTED NOT DETECTED Final   Streptococcus pyogenes NOT DETECTED NOT DETECTED Final   Acinetobacter baumannii NOT DETECTED NOT DETECTED Final   Enterobacteriaceae species NOT DETECTED NOT DETECTED Final   Enterobacter cloacae complex NOT DETECTED NOT DETECTED Final   Escherichia coli NOT DETECTED NOT  DETECTED Final   Klebsiella oxytoca NOT DETECTED NOT DETECTED  Final   Klebsiella pneumoniae NOT DETECTED NOT DETECTED Final   Proteus species NOT DETECTED NOT DETECTED Final   Serratia marcescens NOT DETECTED NOT DETECTED Final   Haemophilus influenzae NOT DETECTED NOT DETECTED Final   Neisseria meningitidis NOT DETECTED NOT DETECTED Final   Pseudomonas aeruginosa NOT DETECTED NOT DETECTED Final   Candida albicans NOT DETECTED NOT DETECTED Final   Candida glabrata NOT DETECTED NOT DETECTED Final   Candida krusei NOT DETECTED NOT DETECTED Final   Candida parapsilosis NOT DETECTED NOT DETECTED Final   Candida tropicalis NOT DETECTED NOT DETECTED Final    Comment: Performed at [facility]  Blood Culture (routine x 2)     Status: None (Preliminary result)   Collection Time: 04/12/16 11:07 AM  Result Value Ref Range Status   Specimen Description BLOOD RIGHT ANTECUBITAL  Final   Special Requests BOTTLES DRAWN AEROBIC AND ANAEROBIC 6cc each  Final   Culture NO GROWTH 4 DAYS  Final   Report Status PENDING  Incomplete  Culture, respiratory (NON-Expectorated)     Status: None   Collection Time: 04/12/16  5:06 PM  Result Value Ref Range Status   Specimen Description TRACHEAL ASPIRATE  Final   Special Requests NONE  Final   Gram Stain   Final    MODERATE WBC PRESENT, PREDOMINANTLY PMN FEW GRAM NEGATIVE RODS RARE GRAM POSITIVE COCCI IN PAIRS AND CHAINS RARE GRAM POSITIVE COCCI IN CLUSTERS RARE GRAM NEGATIVE DIPLOCOCCI    Culture Consistent with normal respiratory flora.  Final   Report Status 04/15/2016 FINAL  Final  CSF culture     Status: None   Collection Time: 04/12/16  5:43 PM  Result Value Ref Range Status   Specimen Description CSF  Final   Special Requests NONE  Final   Gram Stain   Final    WBC PRESENT, PREDOMINANTLY MONONUCLEAR NO ORGANISMS SEEN CYTOSPIN SMEAR    Culture NO GROWTH 3 DAYS  Final   Report Status 04/16/2016 FINAL  Final  MRSA PCR Screening     Status: None   Collection Time: 04/12/16  5:44 PM  Result Value Ref  Range Status   MRSA by PCR NEGATIVE NEGATIVE Final    Comment:        The GeneXpert MRSA Assay (FDA approved for NASAL specimens only), is one component of a comprehensive MRSA colonization surveillance program. It is not intended to diagnose MRSA infection nor to guide or monitor treatment for MRSA infections.        Radiology Studies: No results found.    Scheduled Meds: . atorvastatin  40 mg Oral QHS  . chlorhexidine gluconate (MEDLINE KIT)  15 mL Mouth Rinse BID  . cilostazol  50 mg Oral BID  . feeding supplement (ENSURE ENLIVE)  237 mL Oral TID BM  . folic acid  1 mg Oral Daily  . gabapentin  1,200 mg Oral QHS  . gabapentin  600 mg Oral q morning - 10a  . heparin  5,000 Units Subcutaneous Q8H  . mouth rinse  15 mL Mouth Rinse QID  . metoprolol tartrate  50 mg Oral BID  . multivitamin with minerals  1 tablet Oral Q24H  . pantoprazole  40 mg Oral Daily  . potassium chloride  40 mEq Oral Q4H  . tamsulosin  0.4 mg Oral Daily  . thiamine  100 mg Oral Daily   Continuous Infusions:    LOS: 4 days  Time spent: 30 minutes   Dessa Phi, DO [facility] [email or web address] Password [email or web address] 04/16/2016, 2:31 PM

## 2016-04-16 NOTE — NC FL2 (Signed)
[place] LEVEL OF CARE SCREENING TOOL     IDENTIFICATION  Patient Name: Tony Patterson Birthdate: June 23, 1955 Sex: male Admission Date (Current Location): 04/12/2016  [facility] and [place] Number:  [employer] and Address:  [facility], [place] [address], [place], [place] [address]      Provider Number: [id number]  Attending Physician Name and Address:  Shon Millet*  Relative Name and Phone Number:       Current Level of Care: Hospital Recommended Level of Care: [place] Prior Approval Number:    Date Approved/Denied:   PASRR Number: Pending  Discharge Plan: SNF    Current Diagnoses: Patient Active Problem List   Diagnosis Date Noted  . ETOH abuse   . Alcoholic peripheral neuropathy ([place])   . Non-traumatic rhabdomyolysis   . AKI (acute kidney injury) ([place])   . Benign essential HTN   . Fall   . Community acquired pneumonia   . Hypokalemia   . Hypomagnesemia   . Hypoalbuminemia due to protein-calorie malnutrition ([place])   . Transaminitis   . Acute blood loss anemia   . Acute respiratory failure with hypoxia ([place])   . Altered mental status 04/12/2016  . Alcohol withdrawal seizure ([place]) 04/12/2016  . Rhabdomyolysis 04/12/2016  . Hypertension 04/12/2016  . Dyslipidemia 04/12/2016  . Chronic kidney disease 04/12/2016  . Elevated liver enzymes 04/12/2016  . Depression 04/12/2016  . Anxiety 04/12/2016  . Neuropathy, alcoholic ([place]) 45/80/9983  . History of adenomatous polyp of colon 03/10/2015  . Alcohol abuse, continuous 03/07/2014    Orientation RESPIRATION BLADDER Height & Weight     Self, Situation  Normal Continent Weight: 203 lb 0.7 oz (92.1 kg) Height:  6' 3"  (190.5 cm)  BEHAVIORAL SYMPTOMS/MOOD NEUROLOGICAL BOWEL NUTRITION STATUS   (None) Convulsions/Seizures (From alcohol withdrawal) Continent Diet (DYS 2)  AMBULATORY STATUS COMMUNICATION OF NEEDS Skin     Verbally Other  (Comment), Bruising (Blister, Rash)                       Personal Care Assistance Level of Assistance              Functional Limitations Info  Sight, Hearing, Speech Sight Info: Adequate Hearing Info: Adequate Speech Info: Adequate    SPECIAL CARE FACTORS FREQUENCY  PT (By licensed PT), OT (By licensed OT), Blood pressure     PT Frequency: 5 x week OT Frequency: 5 x week            Contractures Contractures Info: Not present    Additional Factors Info  Code Status, Allergies, Psychotropic Code Status Info: Full Allergies Info: Bee Venom, Penicillins Psychotropic Info: Anxiety, Depression: No scheduled medications for this.         Current Medications (04/16/2016):  This is the current hospital active medication list Current Facility-Administered Medications  Medication Dose Route Frequency Provider Last Rate Last Dose  . 0.9 %  sodium chloride infusion  250 mL Intravenous PRN Omar Person, NP   Stopped at 04/12/16 1654  . 0.9 %  sodium chloride infusion   Intravenous Continuous Juanito Doom, MD 75 mL/hr at 04/16/16 0741    . acetaminophen (TYLENOL) tablet 650 mg  650 mg Oral Q6H PRN Shon Millet, DO   650 mg at 04/15/16 2144  . chlorhexidine gluconate (MEDLINE KIT) (PERIDEX) 0.12 % solution 15 mL  15 mL Mouth Rinse BID Rush Farmer, MD  15 mL at 77/11/65 7903  . folic acid (FOLVITE) tablet 1 mg  1 mg Oral Daily Shon Millet, DO   1 mg at 04/16/16 0816  . heparin injection 5,000 Units  5,000 Units Subcutaneous Q8H Omar Person, NP   5,000 Units at 04/16/16 8333  . hydrALAZINE (APRESOLINE) injection 10-40 mg  10-40 mg Intravenous Q4H PRN Juanito Doom, MD   20 mg at 04/16/16 8329  . LORazepam (ATIVAN) injection 2-3 mg  2-3 mg Intravenous Q1H PRN Greta Doom, MD   2 mg at 04/16/16 1057  . magnesium sulfate IVPB 2 g 50 mL  2 g Intravenous Once [employer], DO   2 g at 04/16/16 1057  . MEDLINE  mouth rinse  15 mL Mouth Rinse QID Rush Farmer, MD   15 mL at 04/15/16 1600  . metoprolol (LOPRESSOR) tablet 50 mg  50 mg Oral BID Juanito Doom, MD   50 mg at 04/16/16 0815  . multivitamin with minerals tablet 1 tablet  1 tablet Oral Q24H Shon Millet, DO   1 tablet at 04/16/16 1101  . naphazoline-pheniramine (NAPHCON-A) 0.025-0.3 % ophthalmic solution 1 drop  1 drop Both Eyes QID PRN Juanito Doom, MD   1 drop at 04/15/16 1912  . pantoprazole (PROTONIX) EC tablet 40 mg  40 mg Oral Daily Wynell Balloon, RPH   40 mg at 04/16/16 0816  . potassium chloride SA (K-DUR,KLOR-CON) CR tablet 40 mEq  40 mEq Oral Q4H Jennifer Chahn-Yang Choi, DO   40 mEq at 04/16/16 1052  . thiamine (VITAMIN B-1) tablet 100 mg  100 mg Oral Daily Shon Millet, DO   100 mg at 04/16/16 1916     Discharge Medications: Please see discharge summary for a list of discharge medications.  Relevant Imaging Results:  Relevant Lab Results:   Additional Information SS#: [id number]  Candie Chroman, LCSW

## 2016-04-16 NOTE — Evaluation (Signed)
Occupational Therapy Evaluation Patient Details Name: Beckey RutterRobert H Chaffin MRN: [id number] DOB: 25-Jan-1956 Today's Date: 04/16/2016    History of Present Illness pt presents with Etoh Withdrawal and Seizure activity.  pt intubated 11/10 - 11/11.  pt with hx of Etoh, Anxiety, HTN, Seizures, Neuropathies, and Anemia.     Clinical Impression   Pt with decline in function and safety with ADLs and ADL mobility with decreased strength, balance, endurance and cognition. Pt would benefit from acute OT services to address impairments to increase level of function and safety    Follow Up Recommendations  SNF    Equipment Recommendations  None recommended by OT;Other (comment) (TBD at next venue of care)    Recommendations for Other Services       Precautions / Restrictions Precautions Precautions: Fall Restrictions Weight Bearing Restrictions: No      Mobility Bed Mobility               General bed mobility comments: pt in bathroom with nurse tech upon arrival  Transfers Overall transfer level: Needs assistance Equipment used: Rolling walker (2 wheeled) Transfers: Sit to/from Stand Sit to Stand: Mod assist         General transfer comment: cues for safety, sequencing, problem solving    Balance Overall balance assessment: Needs assistance   Sitting balance-Leahy Scale: Fair       Standing balance-Leahy Scale: Poor                              ADL Overall ADL's : Needs assistance/impaired     Grooming: Wash/dry hands;Wash/dry face;Minimal assistance;Standing   Upper Body Bathing: Cueing for sequencing;Minimal assitance   Lower Body Bathing: Maximal assistance;Cueing for safety;Cueing for sequencing   Upper Body Dressing : Minimal assistance;Cueing for sequencing   Lower Body Dressing: Total assistance   Toilet Transfer: Moderate assistance;Comfort height toilet;Grab bars;RW;Ambulation;Cueing for sequencing;Cueing for safety   Toileting-  Clothing Manipulation and Hygiene: Maximal assistance       Functional mobility during ADLs: Moderate assistance       Vision Vision Assessment?: No apparent visual deficits          Pertinent Vitals/Pain Pain Assessment: 0-10 Pain Score: 4  Pain Location: back Pain Descriptors / Indicators: Sore Pain Intervention(s): Premedicated before session;Repositioned;Monitored during session     Hand Dominance Left   Extremity/Trunk Assessment Upper Extremity Assessment Upper Extremity Assessment: Generalized weakness   Lower Extremity Assessment Lower Extremity Assessment: Defer to PT evaluation       Communication Communication Communication: No difficulties   Cognition Arousal/Alertness: Awake/alert Behavior During Therapy: WFL for tasks assessed/performed Overall Cognitive Status: Impaired/Different from baseline Area of Impairment: Attention;Memory;Following commands;Awareness;Problem solving     Memory: Decreased short-term memory Following Commands: Follows one step commands consistently;Follows multi-step commands with increased time Safety/Judgement: Decreased awareness of safety;Decreased awareness of deficits   Problem Solving: Slow processing;Decreased initiation;Requires verbal cues;Requires tactile cues     General Comments   pt very pleasant and cooperative                 Home Living Family/patient expects to be discharged to:: Private residence Living Arrangements: Alone Available Help at Discharge: Family Type of Home: House Home Access: Stairs to enter Secretary/administratorntrance Stairs-Number of Steps: 3 Entrance Stairs-Rails: None Home Layout: One level     Bathroom Shower/Tub: Tub/shower unit;Walk-in shower   Bathroom Toilet: Standard     Home Equipment: Cane - single point  Prior Functioning/Environment Level of Independence: Needs assistance  Gait / Transfers Assistance Needed: Per nephew pt occasionally uses a cane and has balance  deficits due to neuropathies.   ADL's / Homemaking Assistance Needed: Nephew present and sets pt up for ADLs.  Nephew performs homemaking tasks and takes pt to store and appointments.              OT Problem List: Decreased strength;Impaired balance (sitting and/or standing);Decreased cognition;Decreased knowledge of precautions;Pain;Decreased activity tolerance;Decreased safety awareness;Decreased knowledge of use of DME or AE   OT Treatment/Interventions: Self-care/ADL training;DME and/or AE instruction;Therapeutic activities;Patient/family education    OT Goals(Current goals can be found in the care plan section) Acute Rehab OT Goals Patient Stated Goal: Get stronger. OT Goal Formulation: With patient/family Time For Goal Achievement: 04/23/16 Potential to Achieve Goals: Good ADL Goals Pt Will Perform Grooming: with min guard assist;standing Pt Will Perform Upper Body Bathing: sitting;with min guard assist Pt Will Perform Lower Body Bathing: with min assist;sitting/lateral leans;sit to/from stand Pt Will Perform Upper Body Dressing: with min guard assist;sitting Pt Will Transfer to Toilet: with min assist;grab bars;ambulating Pt Will Perform Toileting - Clothing Manipulation and hygiene: with mod assist;with min assist;sitting/lateral leans;sit to/from stand  OT Frequency: Min 2X/week   Barriers to D/C: Decreased caregiver support                        End of Session Equipment Utilized During Treatment: Gait belt;Rolling walker  Activity Tolerance: Patient tolerated treatment well Patient left: in chair;with call bell/phone within reach;with chair alarm set;with family/visitor present   Time: [id number] OT Time Calculation (min): 27 min Charges:  OT General Charges $OT Visit: 1 Procedure OT Evaluation $OT Eval Moderate Complexity: 1 Procedure OT Treatments $Therapeutic Activity: 8-22 mins G-Codes:    Galen ManilaSpencer, Zykera Abella Jeanette 04/16/2016, 2:25 PM

## 2016-04-16 NOTE — Progress Notes (Signed)
Initial Nutrition Assessment  DOCUMENTATION CODES:   Not applicable  INTERVENTION:   -Ensure Enlive po TID, each supplement provides 350 kcal and 20 grams of protein  NUTRITION DIAGNOSIS:   Inadequate oral intake related to poor appetite as evidenced by meal completion < 50%.  GOAL:   Patient will meet greater than or equal to 90% of their needs  MONITOR:   PO intake, Supplement acceptance, Diet advancement, Labs, Weight trends, Skin, I & O's  REASON FOR ASSESSMENT:   Consult Assessment of nutrition requirement/status  ASSESSMENT:   60 year old male with PMH of seizure and etoh abuse who was found down, febrile to 103 and with severe rhabdo.  On exam, neck is supple, lungs are clear, pupils are reactive, moving all ext to pain (not command while on sedation).  I reviewed [facility] CXR myself, ETT ok, no infiltrate.  Discussed with [facility]-EDP.    Pt admitted with AMS, status ellipticus, and acute respiratory failure.   Spoke with pt and nephew at bedside. Both confirm that pt had a hearty appetite PTA. Pt was consuming 3 meals per day, which consisted of foods such as spaghetti, hamburgers, OR steak, vegetables, and potatoes. However, appetite has been poor since diet advancement. Pt shares that he lacks desire to eat. Meal completion 25%.   Pt was extubated on 04/13/16. Pt denies any difficulty swallowing or sore throat related to extubation. Pt underwent BSE on 04/15/16 and was advanced to a dysphagia 2 diet with thin liquids. SLP noted coughing with liquids, which pt nephew reports is new to pt. He also adds that softer texture foods seem to be helping pt. Pt also consumed an Ensure supplement, which he enjoyed.   Pt and nephew deny wt loss. They share that pt typically weighs between 200-205#, which is consistent with wt hx.   Discussed importance of good PO intake to promote healing. Pt amenable to supplements due to poor po intake.   Labs reviewed: K: 3.3, Mg: 1.3, Phos WDL.  Medications include folvite, vitamin B-1, and MVI.   Diet Order:  DIET DYS 2 Room service appropriate? Yes; Fluid consistency: Thin  Skin:  Reviewed, no issues  Last BM:  04/14/16  Height:   Ht Readings from Last 1 Encounters:  04/12/16 6\' 3"  (1.905 m)    Weight:   Wt Readings from Last 1 Encounters:  04/15/16 203 lb 0.7 oz (92.1 kg)    Ideal Body Weight:  89.1 kg  BMI:  Body mass index is 25.38 kg/m.  Estimated Nutritional Needs:   Kcal:  1800-2000  Protein:  100-115 grams  Fluid:  1.8-2.0 L  EDUCATION NEEDS:   Education needs addressed  Ivery Michalski A. Mayford KnifeWilliams, RD, LDN, CDE Pager: [phone] After hours Pager: [phone]

## 2016-04-16 NOTE — Clinical Social Work Note (Signed)
Clinical Social Work Assessment  Patient Details  Name: Tony Patterson MRN: [id number] Date of Birth: 09/26/1955  Date of referral:  04/16/16               Reason for consult:  Facility Placement, Discharge Planning                Permission sought to share information with:  Facility Contact Representative, Family Supports Permission granted to share information::  Yes, Verbal Permission Granted  Name::     Russell  Agency::  SNF's  Relationship::  Nephew/Caregiver  Contact Information:  [phone]  Housing/Transportation Living arrangements for the past 2 months:  Single Family Home Source of Information:  Patient, Medical Team, Other (Comment Required) (Nephew/caregiver) Patient Interpreter Needed:  None Criminal Activity/Legal Involvement Pertinent to Current Situation/Hospitalization:  No - Comment as needed Significant Relationships:  Siblings, Other(Comment) (Nephew) Lives with:  Self, Other (Comment) (Nephew stays with him 12 hours during the day) Do you feel safe going back to the place where you live?  Yes Need for family participation in patient care:  Yes (Comment)  Care giving concerns:  PT recommending CIR but CIR deferred to SNF or outpatient PT.   Social Worker assessment / plan:  CSW met with patient. Nephew/caregiver at bedside. Patient not fully oriented. CSW introduced role and explained that PT recommendations would be discussed. CSW explained that CIR will be unable to accept him and discussed other options for SNF vs. HHPT vs. Outpatient PT. CSW provided SNF list and came back later after patient and his nephew had time to review. First preference SNF is [facility]. PASARR under manual review due to ETOH abuse and mental health diagnoses. Patient will be unable to discharge to SNF until PASARR obtained. Patient and his nephew aware and expressed understanding. No further concerns. CSW encouraged patient and his nephew to contact CSW as needed. CSW will  continue to follow patient and his nephew for support and facilitate discharge to SNF once medically stable.  Employment status:  Retired Insurance information:  Other (Comment Required) ([employer]) PT Recommendations:  Inpatient Rehab Consult Information / Referral to community resources:  Skilled Nursing Facility  Patient/Family's Response to care:  Patient and his nephew agreeable to SNF placement. Patient's family supportive and involved in patient's care. Patient and his nephew appreciated social work intervention.  Patient/Family's Understanding of and Emotional Response to Diagnosis, Current Treatment, and Prognosis:  Patient and his nephew understand the need for rehab prior to returning home. Patient and his nephew appear happy with hospital care.  Emotional Assessment Appearance:  Appears stated age Attitude/Demeanor/Rapport:  Other (Pleasant) Affect (typically observed):  Accepting, Appropriate, Calm, Pleasant Orientation:  Oriented to Self, Oriented to Situation Alcohol / Substance use:  Alcohol Use Psych involvement (Current and /or in the community):  No (Comment)  Discharge Needs  Concerns to be addressed:  Care Coordination Readmission within the last 30 days:  No Current discharge risk:  Cognitively Impaired, Dependent with Mobility, Lives alone Barriers to Discharge:  Insurance Authorization    C , LCSW 04/16/2016, 12:57 PM  

## 2016-04-16 NOTE — Clinical Social Work Placement (Signed)
   CLINICAL SOCIAL WORK PLACEMENT  NOTE  Date:  04/16/2016  Patient Details  Name: Beckey RutterRobert H Smethers MRN: [id number] Date of Birth: 1955-07-12  Clinical Social Work is seeking post-discharge placement for this patient at the Skilled  Nursing Facility level of care (*CSW will initial, date and re-position this form in  chart as items are completed):  Yes   Patient/family provided with [place] Clinical Social Work Department's list of facilities offering this level of care within the geographic area requested by the patient (or if unable, by the patient's family).  Yes   Patient/family informed of their freedom to choose among providers that offer the needed level of care, that participate in Medicare, Medicaid or managed care program needed by the patient, have an available bed and are willing to accept the patient.  Yes   Patient/family informed of [place]'s ownership interest in [facility] and [facility], as well as of the fact that they are under no obligation to receive care at these facilities.  PASRR submitted to EDS on 04/16/16     PASRR number received on       Existing PASRR number confirmed on       FL2 transmitted to all facilities in geographic area requested by pt/family on 04/16/16     FL2 transmitted to all facilities within larger geographic area on       Patient informed that his/her managed care company has contracts with or will negotiate with certain facilities, including the following:            Patient/family informed of bed offers received.  Patient chooses bed at       Physician recommends and patient chooses bed at      Patient to be transferred to   on  .  Patient to be transferred to facility by       Patient family notified on   of transfer.  Name of family member notified:        PHYSICIAN Please sign FL2     Additional Comment:    _______________________________________________ Margarito LinerSarah C Daesha Insco, LCSW 04/16/2016, 1:01  PM

## 2016-04-16 NOTE — Progress Notes (Signed)
Modified Barium Swallow Progress Note  Patient Details  Name: Tony Patterson MRN: [id number] Date of Birth: [birth year]  Today's Date: 04/16/2016  Modified Barium Swallow completed.  Full report located under Chart Review in the Imaging Section.  Brief recommendations include the following:  Clinical Impression  Pt tolerated all consistencies trialed WNL. Point of clarification from earlier charting- pt's family had indicated that they thought pt had lh/o laryngeal ca, however pt indicated this is not true. 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 Recommend: Regular diet. No additional SLP services indicated at this time. Will upgrade diet and sign off. Pt in agreement with POC.    Swallow Evaluation Recommendations       SLP Diet Recommendations: Regular solids;Thin liquid   Liquid Administration via: Cup;Straw   Medication Administration: Whole meds with liquid   Supervision: Patient able to self feed;Staff to assist with self feeding   Compensations: Slow rate;Small sips/bites;Minimize environmental distractions   Postural Changes: Seated upright at 90 degrees   Oral Care Recommendations: Oral care QID        Rocky CraftsKara E Ozzie Knobel MA, CCC-SLP Pager [phone] 04/16/2016,3:39 PM

## 2016-04-16 NOTE — Care Management Note (Signed)
Case Management Note  Patient Details  Name: Beckey RutterRobert H Traxler MRN: [id number] Date of Birth: 1955-11-19  Subjective/Objective:              Admitted with Alcohol withdrawal seizure, PMH of anemia, anxiety, seizures (last in 2015), neuropathy, and prior ETOH abuse who [facility] after being found altered at home. Lives alone. States prior to admit independent with ADL's and owns a cane and a walker. Jayme Cloudephew Russel [phone]) states he resides with uncle everyday from 9am -9pm PTA.  PCP: Carylon Perchesoy Fagan  Action/Plan: Plan is to d/c to SNF today. CSW managing disposition.  Expected Discharge Date:                  Expected Discharge Plan:  Skilled Nursing Facility  In-House Referral:  Clinical Social Work  Discharge planning Services  CM Consult  Post Acute Care Choice:    Choice offered to:     DME Arranged:    DME Agency:     HH Arranged:    HH Agency:     Status of Service:  Completed, signed off  If discussed at [employer] Meetings, dates discussed:    Additional Comments:  Epifanio LeschesCole, Kenai Fluegel Hudson, RN 04/16/2016, 10:24 AM

## 2016-04-16 NOTE — Clinical Social Work Note (Addendum)
Per RN, patient down for barium swallow study. CSW called and left voicemail for patient's nephew to call back so bed offers to be presented. [facility] has declined bed offer. [facility] [place].  Charlynn CourtSarah Sheppard Luckenbach, CSW [phone]  2:30 pm Received call back from patient's nephew. CSW provided bed offers. Patient's nephew stated patient had mentioned he would not want to go to [facility] due to hearing negative information about it in the past. Patient's nephew will call CSW back in an hour or so.  Charlynn CourtSarah Serenah Mill, CSW [phone]  2:35 pm Patient's nephew called back and asked that [facility] [place]. One of patient's brothers lives here.  Charlynn CourtSarah Eriq Hufford, CSW [phone]  3:54 pm Received call from patient's nephew. They have [facility]. Erich Montanehris Dudley, admissions coordinator, notified and will start insurance authorization today.  Charlynn CourtSarah Jonell Krontz, CSW [phone]

## 2016-04-17 LAB — COMPREHENSIVE METABOLIC PANEL
ALK PHOS: 59 U/L (ref 38–126)
ALT: 62 U/L (ref 17–63)
AST: 66 U/L — ABNORMAL HIGH (ref 15–41)
Albumin: 2.2 g/dL — ABNORMAL LOW (ref 3.5–5.0)
Anion gap: 8 (ref 5–15)
BUN: 22 mg/dL — ABNORMAL HIGH (ref 6–20)
CALCIUM: 8.5 mg/dL — AB (ref 8.9–10.3)
CO2: 30 mmol/L (ref 22–32)
CREATININE: 2.17 mg/dL — AB (ref 0.61–1.24)
Chloride: 99 mmol/L — ABNORMAL LOW (ref 101–111)
GFR, EST AFRICAN AMERICAN: 37 mL/min — AB (ref >60)
GFR, EST NON AFRICAN AMERICAN: 32 mL/min — AB (ref >60)
Glucose, Bld: 133 mg/dL — ABNORMAL HIGH (ref 65–99)
Potassium: 4.4 mmol/L (ref 3.5–5.1)
Sodium: 137 mmol/L (ref 135–145)
TOTAL PROTEIN: 5.7 g/dL — AB (ref 6.5–8.1)
Total Bilirubin: 0.7 mg/dL (ref 0.3–1.2)

## 2016-04-17 LAB — MAGNESIUM: MAGNESIUM: 1.5 mg/dL — AB (ref 1.7–2.4)

## 2016-04-17 LAB — CULTURE, BLOOD (ROUTINE X 2): Culture: NO GROWTH

## 2016-04-17 MED ORDER — ACETAMINOPHEN 325 MG PO TABS
650.0000 mg | ORAL_TABLET | Freq: Four times a day (QID) | ORAL | Status: DC | PRN
  Administered 2016-04-17 – 2016-04-18 (×3): 650 mg via ORAL
  Filled 2016-04-17 (×3): qty 2

## 2016-04-17 NOTE — Progress Notes (Addendum)
PROGRESS NOTE        PATIENT DETAILS Name: Tony Patterson Age: 60 y.o. Sex: male Date of Birth: 21-Jul-1955 Admit Date: 04/12/2016 Admitting Physician Rush Farmer, MD ZOX:WRUEA,VWU, MD  Brief Narrative: Tony Patterson is a 60 y/o M with PMH of anemia, anxiety, seizures (last in 2015), neuropathy, and prior ETOH abuse who presented to [facility] on 11/10 after being found altered at home. Patient's nephew reported he entered the patient's room and found him lying on the floor with blood from his mouth and left hand - family thought he was postictal. Family also reported he wasn't as responsive as usual the day prior to admit and had fever. ER exam notes him to be febrile to 104, hypertensive, tachypneic, altered and no visualized rashes, no nuchal regidity. During lab draw, he had a witnessed seizure and he was treated with ativan. Due to poor mental status and concern for airway protection, he was intubated. CT of the head assessed and negative for acute process. He was treated with IVF and empiric broad spectrum antibiotics and transferred to [facility] ICU. Infectious disease was consulted. Patient underwent LP which was negative for infectious process. Neurology was consulted. Patient underwent EEG which showed no seizure or seizure predisposition. Patient was eventually extubated and transferred under [facility] care. It is felt that his seizure was secondary to alcohol withdrawal.   Subjective: Awake and alert  No chest pain No shortness of breath  Assessment/Plan: Principal Problem: Acute metabolic encephalopathy: Resolved, intial concern for CNS infection-however CSF analysis not consistent with meningitis, all cultures negative. Now felt that encephalopathy was probably secondary to alcohol withdrawl seizures. CT head on 11/10 and negative for acute abnormalities.  Suspected alcohol withdrawal seizure: Initially treated with Keppra-but now felt that seizures  most likely secondary to alcohol withdrawal. Neurology followed the patient closely during this hospital stay, recommendations were to discontinue Keppra.  Acute on chronic kidney disease stage III: Acute kidney injury likely secondary to rhabdomyolysis, slowly improving.  Acute hypoxemic respiratory failure: In the setting of seizures and inability to protect his airway. Extubated on 11/11, stable since then.  Rhabdomyolysis: Secondary to seizures, CK has down trended significantly.  History of alcohol abuse: Per chart review and from history, patient had stopped drinking a few days prior to this admission-this likely triggered seizures. Currently with no signs of withdrawal, continue Ativan per protocol. Counseled regarding importance of quitting.  Newly elevated troponin: Trend is flat and not consistent with ACS. I suspect this was secondary to rhabdomyolysis/acute kidney injury.  Left lower lobe pneumonia: Treated with Levaquin, has completed antimicrobial therapy. Is afebrile and clinically improved.  Hypertension: Controlled-continue metoprolol.  BPH: Continue Flomax  Chronic peripheral neuropathy: Probably secondary to alcohol use, continue Neurontin  DVT Prophylaxis: Prophylactic  Heparin   Code Status: Full code  Family Communication: None at bedside  Disposition Plan: Remain inpatient-but will plan on SNF on discharge  Antimicrobial agents: See below  Procedures: None  CONSULTS:  pulmonary/intensive care, ID and neurology  Time spent: 25 minutes-Greater than 50% of this time was spent in counseling, explanation of diagnosis, planning of further management, and coordination of care.  MEDICATIONS: Anti-infectives    Start     Dose/Rate Route Frequency Ordered Stop   04/16/16 1000  levofloxacin (LEVAQUIN) tablet 750 mg     750 mg Oral Every  48 hours 04/15/16 0826 04/16/16 1051   04/14/16 1000  levofloxacin (LEVAQUIN) IVPB 750 mg  Status:  Discontinued     750  mg 100 mL/hr over 90 Minutes Intravenous Every 48 hours 04/12/16 1531 04/15/16 0826   04/12/16 2200  vancomycin (VANCOCIN) IVPB 750 mg/150 ml premix  Status:  Discontinued     750 mg 150 mL/hr over 60 Minutes Intravenous Every 12 hours 04/12/16 1531 04/14/16 1136   04/12/16 1830  acyclovir (ZOVIRAX) 900 mg in dextrose 5 % 150 mL IVPB  Status:  Discontinued     900 mg 168 mL/hr over 60 Minutes Intravenous Every 12 hours 04/12/16 1828 04/12/16 1913   04/12/16 1800  aztreonam (AZACTAM) 1 g in dextrose 5 % 50 mL IVPB  Status:  Discontinued     1 g 100 mL/hr over 30 Minutes Intravenous Every 8 hours 04/12/16 1531 04/12/16 1812   04/12/16 1100  levofloxacin (LEVAQUIN) IVPB 750 mg     750 mg 100 mL/hr over 90 Minutes Intravenous  Once 04/12/16 1050 04/12/16 1338   04/12/16 1100  aztreonam (AZACTAM) 2 g in dextrose 5 % 50 mL IVPB     2 g 100 mL/hr over 30 Minutes Intravenous  Once 04/12/16 1050 04/12/16 1338   04/12/16 1100  vancomycin (VANCOCIN) IVPB 1000 mg/200 mL premix  Status:  Discontinued     1,000 mg 200 mL/hr over 60 Minutes Intravenous  Once 04/12/16 1050 04/12/16 1057   04/12/16 1100  vancomycin (VANCOCIN) 1,500 mg in sodium chloride 0.9 % 500 mL IVPB     1,500 mg 250 mL/hr over 120 Minutes Intravenous  Once 04/12/16 1057 04/12/16 1339      Scheduled Meds: . atorvastatin  40 mg Oral QHS  . chlorhexidine gluconate (MEDLINE KIT)  15 mL Mouth Rinse BID  . cilostazol  50 mg Oral BID  . feeding supplement (ENSURE ENLIVE)  237 mL Oral TID BM  . folic acid  1 mg Oral Daily  . gabapentin  1,200 mg Oral QHS  . gabapentin  600 mg Oral q morning - 10a  . heparin  5,000 Units Subcutaneous Q8H  . mouth rinse  15 mL Mouth Rinse QID  . metoprolol tartrate  50 mg Oral BID  . multivitamin with minerals  1 tablet Oral Q24H  . oxyCODONE  5 mg Oral Once  . pantoprazole  40 mg Oral Daily  . tamsulosin  0.4 mg Oral Daily  . thiamine  100 mg Oral Daily   Continuous Infusions: PRN Meds:.sodium  chloride, hydrALAZINE, LORazepam, naphazoline-pheniramine   PHYSICAL EXAM: Vital signs: Vitals:   04/16/16 0900 04/16/16 2112 04/17/16 0102 04/17/16 0434  BP: (!) 159/92 (!) 145/86 140/85 121/75  Pulse:  (!) 118 (!) 110 99  Resp:  17 18 18   Temp:  97.9 F (36.6 C) 98.4 F (36.9 C) 99.4 F (37.4 C)  TempSrc:  Oral Oral Oral  SpO2:  99% 98% 98%  Weight:      Height:       Filed Weights   04/13/16 0500 04/14/16 0339 04/15/16 0456  Weight: 95.2 kg (209 lb 14.1 oz) 91.1 kg (200 lb 13.4 oz) 92.1 kg (203 lb 0.7 oz)   Body mass index is 25.38 kg/m.   General appearance :Awake, alert, not in any distress. Speech Clear. Not toxic Looking Eyes:, pupils equally reactive to light and accomodation,no scleral icterus.Pink conjunctiva HEENT: Atraumatic and Normocephalic Neck: supple, no JVD. No cervical lymphadenopathy. No thyromegaly Resp:Good air entry bilaterally, no  added sounds  CVS: S1 S2 regular, no murmurs.  GI: Bowel sounds present, Non tender and not distended with no gaurding, rigidity or rebound.No organomegaly Extremities: B/L Lower Ext shows no edema, both legs are warm to touch Neurology:  speech clear,Non focal, sensation is grossly intact. Psychiatric: Normal judgment and insight. Alert and oriented x 3. Normal mood. Musculoskeletal:No digital cyanosis Skin:No Rash, warm and dry Wounds:N/A  I have personally reviewed following labs and imaging studies  LABORATORY DATA: CBC:  Recent Labs Lab 04/12/16 1051 04/12/16 1536 04/12/16 1550 04/13/16 0656 04/14/16 0219 04/15/16 0829  WBC 10.3 8.4  --  7.0 9.5 7.7  NEUTROABS 8.9*  --  7.0  --   --  6.2  HGB 13.5 11.6*  --  11.8* 12.7* 12.6*  HCT 41.8 36.0*  --  36.3* 38.7* 38.7*  MCV 89.7 89.6  --  90.1 89.0 89.2  PLT 189 146*  --  132* 136* 093    Basic Metabolic Panel:  Recent Labs Lab 04/13/16 0656 04/14/16 0219 04/15/16 0829 04/16/16 0528 04/17/16 0530  NA 141 142 141 138 137  K 3.3* 3.7 2.8* 3.3* 4.4    CL 102 102 101 103 99*  CO2 26 24 25 24 30   GLUCOSE 102* 81 117* 122* 133*  BUN 19 18 16 15  22*  CREATININE 2.38* 2.23* 1.92* 1.90* 2.17*  CALCIUM 7.5* 8.3* 7.9* 7.9* 8.5*  MG 1.2*  --  1.0* 1.3* 1.5*  PHOS 4.2  --  2.8  --   --     GFR: Estimated Creatinine Clearance: 43.8 mL/min (by C-G formula based on SCr of 2.17 mg/dL (H)).  Liver Function Tests:  Recent Labs Lab 04/13/16 0656 04/14/16 0219 04/15/16 0829 04/16/16 0528 04/17/16 0530  AST 290* 362* 187* 104* 66*  ALT 71* 110* 93* 75* 62  ALKPHOS 53 63 59 58 59  BILITOT 1.0 1.2 1.6* 1.2 0.7  PROT 5.5* 6.3* 6.2* 5.9* 5.7*  ALBUMIN 2.6* 3.0* 2.8* 2.5* 2.2*    Recent Labs Lab 04/12/16 1051  LIPASE 22   No results for input(s): AMMONIA in the last 168 hours.  Coagulation Profile:  Recent Labs Lab 04/12/16 1051  INR 1.02    Cardiac Enzymes:  Recent Labs Lab 04/12/16 1052 04/12/16 1536 04/12/16 1959 04/13/16 0012 04/13/16 0656 04/16/16 0528  CKTOTAL 39,251* 42,653* 40,016*  --  26,145* 2,224*  TROPONINI  --  0.96* 0.68* 0.61* 0.33*  --     BNP (last 3 results) No results for input(s): PROBNP in the last 8760 hours.  HbA1C: No results for input(s): HGBA1C in the last 72 hours.  CBG:  Recent Labs Lab 04/12/16 1108 04/13/16 1540  GLUCAP 142* 71    Lipid Profile: No results for input(s): CHOL, HDL, LDLCALC, TRIG, CHOLHDL, LDLDIRECT in the last 72 hours.  Thyroid Function Tests: No results for input(s): TSH, T4TOTAL, FREET4, T3FREE, THYROIDAB in the last 72 hours.  Anemia Panel: No results for input(s): VITAMINB12, FOLATE, FERRITIN, TIBC, IRON, RETICCTPCT in the last 72 hours.  Urine analysis:    Component Value Date/Time   COLORURINE YELLOW 04/12/2016 1046   APPEARANCEUR HAZY (A) 04/12/2016 1046   LABSPEC 1.025 04/12/2016 1046   PHURINE 6.5 04/12/2016 1046   GLUCOSEU NEGATIVE 04/12/2016 1046   HGBUR LARGE (A) 04/12/2016 1046   BILIRUBINUR MODERATE (A) 04/12/2016 1046   KETONESUR  15 (A) 04/12/2016 1046   PROTEINUR >300 (A) 04/12/2016 1046   UROBILINOGEN 0.2 05/06/2013 1015   NITRITE NEGATIVE 04/12/2016 1046  LEUKOCYTESUR NEGATIVE 04/12/2016 1046    Sepsis Labs: Lactic Acid, Venous    Component Value Date/Time   LATICACIDVEN 1.4 04/12/2016 1959    MICROBIOLOGY: Recent Results (from the past 240 hour(s))  Urine culture     Status: None   Collection Time: 04/12/16 10:46 AM  Result Value Ref Range Status   Specimen Description URINE, RANDOM  Final   Special Requests NONE  Final   Culture NO GROWTH Performed at [facility]   Final   Report Status 04/14/2016 FINAL  Final  Blood Culture (routine x 2)     Status: Abnormal   Collection Time: 04/12/16 10:52 AM  Result Value Ref Range Status   Specimen Description BLOOD BLOOD LEFT ARM  Final   Special Requests BOTTLES DRAWN AEROBIC AND ANAEROBIC 6cc each  Final   Culture  Setup Time   Final    GRAM POSITIVE COCCI Gram Stain Report Called to,Read Back By and Verified With: [place]. [place] [id number] AT 0710A BY THOMPSON S. IN BOTH AEROBIC AND ANAEROBIC BOTTLES CRITICAL RESULT CALLED TO, READ BACK BY AND VERIFIED WITH: M MACCIA,PHARMD AT 1003 04/13/16 BY L BENFIELD    Culture (A)  Final    STAPHYLOCOCCUS SPECIES (COAGULASE NEGATIVE) THE SIGNIFICANCE OF ISOLATING THIS ORGANISM FROM A SINGLE SET OF BLOOD CULTURES WHEN MULTIPLE SETS ARE DRAWN IS UNCERTAIN. PLEASE NOTIFY THE MICROBIOLOGY DEPARTMENT WITHIN ONE WEEK IF SPECIATION AND SENSITIVITIES ARE REQUIRED. Performed at [facility]    Report Status 04/15/2016 FINAL  Final  Blood Culture ID Panel (Reflexed)     Status: Abnormal   Collection Time: 04/12/16 10:52 AM  Result Value Ref Range Status   Enterococcus species NOT DETECTED NOT DETECTED Final   Listeria monocytogenes NOT DETECTED NOT DETECTED Final   Staphylococcus species DETECTED (A) NOT DETECTED Final    Comment: CRITICAL RESULT CALLED TO, READ BACK BY AND VERIFIED WITH: M  MACCIA,PHARMD AT 1003 04/13/16 BY L BENFIELD    Staphylococcus aureus NOT DETECTED NOT DETECTED Final   Methicillin resistance NOT DETECTED NOT DETECTED Final   Streptococcus species NOT DETECTED NOT DETECTED Final   Streptococcus agalactiae NOT DETECTED NOT DETECTED Final   Streptococcus pneumoniae NOT DETECTED NOT DETECTED Final   Streptococcus pyogenes NOT DETECTED NOT DETECTED Final   Acinetobacter baumannii NOT DETECTED NOT DETECTED Final   Enterobacteriaceae species NOT DETECTED NOT DETECTED Final   Enterobacter cloacae complex NOT DETECTED NOT DETECTED Final   Escherichia coli NOT DETECTED NOT DETECTED Final   Klebsiella oxytoca NOT DETECTED NOT DETECTED Final   Klebsiella pneumoniae NOT DETECTED NOT DETECTED Final   Proteus species NOT DETECTED NOT DETECTED Final   Serratia marcescens NOT DETECTED NOT DETECTED Final   Haemophilus influenzae NOT DETECTED NOT DETECTED Final   Neisseria meningitidis NOT DETECTED NOT DETECTED Final   Pseudomonas aeruginosa NOT DETECTED NOT DETECTED Final   Candida albicans NOT DETECTED NOT DETECTED Final   Candida glabrata NOT DETECTED NOT DETECTED Final   Candida krusei NOT DETECTED NOT DETECTED Final   Candida parapsilosis NOT DETECTED NOT DETECTED Final   Candida tropicalis NOT DETECTED NOT DETECTED Final    Comment: Performed at [facility]  Blood Culture (routine x 2)     Status: None   Collection Time: 04/12/16 11:07 AM  Result Value Ref Range Status   Specimen Description BLOOD RIGHT ANTECUBITAL  Final   Special Requests BOTTLES DRAWN AEROBIC AND ANAEROBIC 6cc each  Final   Culture NO GROWTH 5  DAYS  Final   Report Status 04/17/2016 FINAL  Final  Culture, respiratory (NON-Expectorated)     Status: None   Collection Time: 04/12/16  5:06 PM  Result Value Ref Range Status   Specimen Description TRACHEAL ASPIRATE  Final   Special Requests NONE  Final   Gram Stain   Final    MODERATE WBC PRESENT, PREDOMINANTLY PMN FEW GRAM NEGATIVE  RODS RARE GRAM POSITIVE COCCI IN PAIRS AND CHAINS RARE GRAM POSITIVE COCCI IN CLUSTERS RARE GRAM NEGATIVE DIPLOCOCCI    Culture Consistent with normal respiratory flora.  Final   Report Status 04/15/2016 FINAL  Final  CSF culture     Status: None   Collection Time: 04/12/16  5:43 PM  Result Value Ref Range Status   Specimen Description CSF  Final   Special Requests NONE  Final   Gram Stain   Final    WBC PRESENT, PREDOMINANTLY MONONUCLEAR NO ORGANISMS SEEN CYTOSPIN SMEAR    Culture NO GROWTH 3 DAYS  Final   Report Status 04/16/2016 FINAL  Final  MRSA PCR Screening     Status: None   Collection Time: 04/12/16  5:44 PM  Result Value Ref Range Status   MRSA by PCR NEGATIVE NEGATIVE Final    Comment:        The GeneXpert MRSA Assay (FDA approved for NASAL specimens only), is one component of a comprehensive MRSA colonization surveillance program. It is not intended to diagnose MRSA infection nor to guide or monitor treatment for MRSA infections.     RADIOLOGY STUDIES/RESULTS: Ct Head Wo Contrast  Result Date: 04/12/2016 CLINICAL DATA:  Unwitnessed seizure today. History of seizure disorder, but no seizure in 2 years. Fever, tachycardia and intubation. EXAM: CT HEAD WITHOUT CONTRAST TECHNIQUE: Contiguous axial images were obtained from the base of the skull through the vertex without intravenous contrast. COMPARISON:  CT head 11/02/2011. FINDINGS: Brain: There is no evidence of acute intracranial hemorrhage, mass lesion, brain edema or extra-axial fluid collection. Stable mild generalized atrophy. There is no CT evidence of acute cortical infarction. Vascular: Intracranial vascular calcifications are present. Skull: Negative for fracture or focal lesion. Sinuses/Orbits: Patient is intubated. There is mild mucosal thickening in the ethmoid sinuses. There is a small amount of fluid dependently in the sphenoid sinus. The frontal and maxillary sinuses are clear. The mastoid air cells  and middle ears are clear. No orbital abnormalities are seen. Other: None. IMPRESSION: 1. No acute intracranial findings. 2. Mild paranasal sinus mucosal thickening and fluid attributed to intubation. Electronically Signed   By: Richardean Sale M.D.   On: 04/12/2016 12:30   Dg Chest Port 1 View  Result Date: 04/13/2016 CLINICAL DATA:  ETT. EXAM: PORTABLE CHEST 1 VIEW COMPARISON:  April 12, 2016 FINDINGS: The ETT and NG tube remain, in good position. No pneumothorax. The opacity in the left lung base persists but is improved. No other interval changes. IMPRESSION: Stable support apparatus.  Improving left basilar opacity. Electronically Signed   By: Dorise Bullion III M.D   On: 04/13/2016 07:30   Dg Chest Port 1 View  Result Date: 04/12/2016 CLINICAL DATA:  Endotracheal tube placement.  Status post seizure. EXAM: PORTABLE CHEST 1 VIEW COMPARISON:  04/12/2016 at 11:28 a.m. FINDINGS: Endotracheal tube tip now projects 3.4 cm above the Carina. Orogastric tube is stable passing below the diaphragm well into the stomach. There is discoid type atelectasis in the lower lung on the left. Mild streaky atelectasis is noted at the right lung base.  Lungs otherwise clear. No convincing pleural effusion. No pneumothorax. IMPRESSION: 1. Endotracheal tube is well positioned, tip projecting 3.4 cm above the Carina. 2. Well-positioned nasal/orogastric tube. 3. Mild basilar atelectasis. Electronically Signed   By: Lajean Manes M.D.   On: 04/12/2016 16:16   Dg Chest Port 1 View  Result Date: 04/12/2016 CLINICAL DATA:  Fever and tachycardia EXAM: PORTABLE CHEST 1 VIEW COMPARISON:  December 11, 2015 FINDINGS: There is no edema or consolidation. The heart size and pulmonary vascularity are normal. No adenopathy. There old healed rib fractures on the right. IMPRESSION: No edema or consolidation. Electronically Signed   By: Lowella Grip III M.D.   On: 04/12/2016 11:18   Dg Chest Port 1v Same Day  Result Date:  04/12/2016 CLINICAL DATA:  Intubation and nasal/ orogastric tube placement. EXAM: PORTABLE CHEST 1 VIEW COMPARISON:  04/12/2016 FINDINGS: Endotracheal tube tip projects 4 cm above the Carina. Nasal/orogastric tube passes well below the diaphragm into the mid stomach. No other change from the prior study. No acute findings in the lungs. IMPRESSION: 1. Endotracheal tube tip projects 4 cm above the Carina. 2. Nasal/orogastric tube is well positioned passing well into the stomach. Electronically Signed   By: Lajean Manes M.D.   On: 04/12/2016 11:42   Dg Swallowing Func-speech Pathology  Result Date: 04/16/2016 Objective Swallowing Evaluation: Type of Study: MBS-Modified Barium Swallow Study Patient Details Name: YORDIN RHODA MRN: [id number] Date of Birth: March 04, 1956 Today's Date: 04/16/2016 Time: SLP Start Time (ACUTE ONLY): 1402-SLP Stop Time (ACUTE ONLY): 1421 SLP Time Calculation (min) (ACUTE ONLY): 19 min Past Medical History: Past Medical History: Diagnosis Date . Alcohol abuse  . Anemia  . Anxiety  . Hypertension  . Neuropathy ([place])  . Seizures ([place])   unknown etiology and on no meds; been 4 years since seizure. Past Surgical History: Past Surgical History: Procedure Laterality Date . COLONOSCOPY  05/18/2007  XBJ:[id number] hemorrhoids, a single anal papilla, otherwise normal/ Single polyp, as described above, removed  . COLONOSCOPY WITH PROPOFOL N/A 03/16/2015  Procedure: COLONOSCOPY WITH PROPOFOL;  Surgeon: Daneil Dolin, MD;  Location: [facility];  Service: Endoscopy;  Laterality: N/A;  in cecum at 1344. withdrawal time  44mn . THROAT SURGERY    polyps removed x2 HPI: Pt is a 60y.o. male with PMH of anemia, anxiety, seizures (last in 2015), neuropathy, and prior [facility]. Pt was found to be febrile, hypertensive, tachypneic, had witnessed seizure, and was intubated until 11/11. CXR today showed improving L basilar opacity. Bedside swallow eval  ordered post-extubation.  Subjective: "I'm tired." Assessment / Plan / Recommendation CHL IP CLINICAL IMPRESSIONS 04/16/2016 Therapy Diagnosis WFL Clinical Impression Pt tolerated all consistencies trialed WNL. Point of clarification from earlier charting- pt's family had indicated that they thought pt had lh/o laryngeal ca, however pt indicated this is not true. 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 Recommend: Regular diet. No additional SLP services indicated at this time. Will upgrade diet and sign off. Pt in agreement with POC.  Impact on safety and function No limitations   CHL IP TREATMENT RECOMMENDATION 04/16/2016 Treatment Recommendations No treatment recommended at this time   Prognosis 04/16/2016 Prognosis for Safe Diet Advancement Good Barriers to Reach Goals -- Barriers/Prognosis Comment -- CHL IP DIET RECOMMENDATION 04/16/2016 SLP Diet Recommendations Regular solids;Thin liquid Liquid Administration via Cup;Straw Medication Administration Whole meds with liquid Compensations Slow rate;Small sips/bites;Minimize environmental distractions  Postural Changes Seated upright at 90 degrees   CHL IP OTHER RECOMMENDATIONS 04/16/2016 Recommended Consults -- Oral Care Recommendations Oral care QID Other Recommendations --   CHL IP FOLLOW UP RECOMMENDATIONS 04/16/2016 Follow up Recommendations None   CHL IP FREQUENCY AND DURATION 04/13/2016 Speech Therapy Frequency (ACUTE ONLY) min 1 x/week Treatment Duration 1 week      CHL IP ORAL PHASE 04/16/2016 Oral Phase WFL Oral - Pudding Teaspoon -- Oral - Pudding Cup -- Oral - Honey Teaspoon -- Oral - Honey Cup -- Oral - Nectar Teaspoon -- Oral - Nectar Cup -- Oral - Nectar Straw -- Oral - Thin Teaspoon -- Oral - Thin Cup -- Oral - Thin Straw -- Oral - Puree -- Oral - Mech Soft -- Oral - Regular -- Oral - Multi-Consistency -- Oral - Pill -- Oral Phase - Comment --  CHL IP PHARYNGEAL PHASE 04/16/2016 Pharyngeal Phase WFL Pharyngeal- Pudding Teaspoon  -- Pharyngeal -- Pharyngeal- Pudding Cup -- Pharyngeal -- Pharyngeal- Honey Teaspoon -- Pharyngeal -- Pharyngeal- Honey Cup -- Pharyngeal -- Pharyngeal- Nectar Teaspoon -- Pharyngeal -- Pharyngeal- Nectar Cup -- Pharyngeal -- Pharyngeal- Nectar Straw -- Pharyngeal -- Pharyngeal- Thin Teaspoon -- Pharyngeal -- Pharyngeal- Thin Cup -- Pharyngeal -- Pharyngeal- Thin Straw -- Pharyngeal -- Pharyngeal- Puree -- Pharyngeal -- Pharyngeal- Mechanical Soft -- Pharyngeal -- Pharyngeal- Regular -- Pharyngeal -- Pharyngeal- Multi-consistency -- Pharyngeal -- Pharyngeal- Pill -- Pharyngeal -- Pharyngeal Comment --  CHL IP CERVICAL ESOPHAGEAL PHASE 04/16/2016 Cervical Esophageal Phase WFL Pudding Teaspoon -- Pudding Cup -- Honey Teaspoon -- Honey Cup -- Nectar Teaspoon -- Nectar Cup -- Nectar Straw -- Thin Teaspoon -- Thin Cup -- Thin Straw -- Puree -- Mechanical Soft -- Regular -- Multi-consistency -- Pill -- Cervical Esophageal Comment -- No flowsheet data found. Vinetta Bergamo MA, CCC-SLP Pager [phone] 04/16/2016, 3:39 PM                LOS: 5 days   Oren Binet, MD  [facility] Pager:[phone]  If 7PM-7AM, please contact night-coverage [email or web address] Password [email or web address] 04/17/2016, 1:12 PM

## 2016-04-17 NOTE — Care Management Note (Addendum)
Case Management Note  Patient Details  Name: Tony Patterson MRN: [id number] Date of Birth: April 03, 1956  Subjective/Objective:                    Action/Plan: Plan is to d/c today, awaiting PASRR evaluation for placement.  Expected Discharge Date:       04/17/2016           Expected Discharge Plan:  Skilled Nursing Facility  In-House Referral:  Clinical Social Work  Discharge planning Services  CM Consult  Status of Service:  Completed, signed off  If discussed at [employer] Length of Stay Meetings, dates discussed:    Additional Comments:  Epifanio LeschesCole, Zaida Reiland Hudson, RN 04/17/2016, 10:00 AM

## 2016-04-17 NOTE — Progress Notes (Signed)
Physical Therapy Treatment Patient Details Name: Tony RutterRobert H Patterson MRN: [id number] DOB: [birth year] Today's Date: 04/17/2016    History of Present Illness pt presents with Etoh Withdrawal and Seizure activity.  pt intubated 11/10 - 11/11.  pt with hx of Etoh, Anxiety, HTN, Seizures, Neuropathies, and Anemia.      PT Comments    The pt is progressing very well toward all goals.  He ambulated further and needed significantly less assistance.  Will notify supervising PT of pts progress.  Continue with POC.  Follow Up Recommendations  CIR     Equipment Recommendations  None recommended by PT    Recommendations for Other Services Rehab consult     Precautions / Restrictions Precautions Precautions: Fall Restrictions Weight Bearing Restrictions: No    Mobility  Bed Mobility Overal bed mobility: Needs Assistance Bed Mobility: Supine to Sit     Supine to sit: Supervision     General bed mobility comments: Pt needed verbal cues for correct technique.  Transfers Overall transfer level: Needs assistance Equipment used: Rolling walker (2 wheeled) Transfers: Sit to/from Stand Sit to Stand: Supervision         General transfer comment: cues for safety, sequencing, problem solving  Ambulation/Gait Ambulation/Gait assistance: Min guard;Supervision (Min guard initially for safety.) Ambulation Distance (Feet): 250 Feet Assistive device: Rolling walker (2 wheeled) Gait Pattern/deviations: Step-through pattern;Decreased stride length;Antalgic;Decreased stance time - right;Trunk flexed   Gait velocity interpretation: Below normal speed for age/gender General Gait Details: Pt walks with antalgic gait as a result of a past ankle injury.  Verbal cues needed for correct upright posture and for safety awareness.   Stairs            Wheelchair Mobility    Modified Rankin (Stroke Patients Only)       Balance Overall balance assessment: No apparent balance deficits (not  formally assessed) Sitting-balance support: No upper extremity supported;Feet supported   Sitting balance - Comments: pt was able to sit on EOB without support for one minute.   Standing balance support: Bilateral upper extremity supported;During functional activity Standing balance-Leahy Scale: Fair Standing balance comment: Pt able to stand without support for a brief period of time.                    Cognition Arousal/Alertness: Awake/alert Behavior During Therapy: WFL for tasks assessed/performed       Current Attention Level: Focused                Exercises      General Comments        Pertinent Vitals/Pain Pain Assessment: Faces Faces Pain Scale: Hurts a little bit Pain Location: back Pain Descriptors / Indicators: Sore Pain Intervention(s): Monitored during session    Home Living                      Prior Function            PT Goals (current goals can now be found in the care plan section) Acute Rehab PT Goals Patient Stated Goal: Get stronger. PT Goal Formulation: With patient/family Time For Goal Achievement: 04/29/16 Potential to Achieve Goals: Good Progress towards PT goals: Progressing toward goals    Frequency    Min 3X/week      PT Plan      Co-evaluation             End of Session Equipment Utilized During Treatment: Gait belt Activity Tolerance: Patient tolerated  treatment well Patient left: in bed;with call bell/phone within reach;with family/visitor present     Time: [id number] PT Time Calculation (min) (ACUTE ONLY): 10 min  Charges:  $Gait Training: 8-22 mins                    G Codes:      Cathleen CortiMary Antiono Ettinger 04/17/2016, 5:54 PM Zelphia CairoMary R. Sarissa Dern, Leda GauzeSPTA [phone]

## 2016-04-18 ENCOUNTER — Ambulatory Visit: Payer: Managed Care, Other (non HMO) | Admitting: Physical Therapy

## 2016-04-18 MED ORDER — FAMOTIDINE 20 MG PO TABS: 20.0000 mg | ORAL_TABLET | Freq: Every day | ORAL | Status: DC

## 2016-04-18 MED ORDER — ENSURE ENLIVE PO LIQD: 237.0000 mL | Freq: Three times a day (TID) | ORAL | Status: DC

## 2016-04-18 MED ORDER — ADULT MULTIVITAMIN W/MINERALS CH: 1.0000 | ORAL_TABLET | ORAL | Status: AC

## 2016-04-18 MED ORDER — LORAZEPAM 0.5 MG PO TABS: 0.5000 mg | ORAL_TABLET | Freq: Every evening | ORAL | 0 refills | Status: DC | PRN

## 2016-04-18 MED ORDER — FOLIC ACID 1 MG PO TABS
1.0000 mg | ORAL_TABLET | Freq: Every day | ORAL | Status: DC
Start: 2016-04-19 — End: 2021-02-23

## 2016-04-18 MED ORDER — THIAMINE HCL 100 MG PO TABS: 100.0000 mg | ORAL_TABLET | Freq: Every day | ORAL | Status: DC

## 2016-04-18 NOTE — Discharge Summary (Signed)
PATIENT DETAILS Name: Tony Patterson Age: 60 y.o. Sex: male Date of Birth: 04-10-1956 MRN: [id number]. Admitting Physician: Alyson Reedy, MD [id number],MVH, MD  Admit Date: 04/12/2016 Discharge date: 04/18/2016  Recommendations for Outpatient Follow-up:  1. Follow up with PCP in 1-2 weeks 2. Please obtain BMP/CBC in one week 3. HIV antibody still pending, please follow  Admitted From:  Home  Disposition: SNF   Home Health: No  Equipment/Devices: None  Discharge Condition: Stable  CODE STATUS: FULL CODE  Diet recommendation:  Heart Healthy   Brief Summary: See H&P, Labs, Consult and Test reports for all details in brief, patient 60 year old male with history of peripheral neuropathy, EtOH use who resented to the hospital with fever, altered mental status. He was intubated and admitted to the intensive care unit. Neurology was consulted, initially patient was thought to have an infective process-subsequently underwent lumbar puncture with CSF analysis not consistent with any infection. Upon further evaluation and talking with family members, it is now thought that patient stop drinking for a few days prior to this admission and he probably had alcohol withdrawal seizures. See below for further details  Brief Hospital Course: Acute metabolic encephalopathy: Resolved, intial concern for CNS infection-however CSF analysis not consistent with meningitis, all cultures negative. Now felt that encephalopathy was probably secondary to alcohol withdrawl seizures. CT head on 11/10 and negative for acute abnormalities.  Suspected alcohol withdrawal seizure: Initially treated with Keppra-but now felt that seizures most likely secondary to alcohol withdrawal. It appears that patient had stopped drinking for a few days prior to this admission, likely triggering withdrawal seizures.  Neurology followed the patient closely during this hospital stay, recommendations were to discontinue  Keppra. No further seizures for the past few days even off Keppra.  Acute on chronic kidney disease stage III: Acute kidney injury likely secondary to rhabdomyolysis, slowly improving-but seems to have plateaued. Patient is euvolemic, suspect that creatinine will continue to fluctuate-recommend that we recheck creatinine/chemistry panel in 1 week.  Acute hypoxemic respiratory failure: In the setting of seizures and inability to protect his airway. Extubated on 11/11, stable on room air since then.  Rhabdomyolysis: Secondary to seizures, CK has down trended significantly. Suggest that we check CK in approximately 1 week.  History of alcohol abuse: Per chart review and from history, patient had stopped drinking a few days prior to this admission-this likely triggered seizures. Currently with no signs of withdrawal, continue Ativan per protocol. Counseled regarding importance of quitting.  Newly elevated troponin: Trend is flat and not consistent with ACS. I suspect this was secondary to rhabdomyolysis/acute kidney injury.  Left lower lobe pneumonia: Treated with Levaquin, has completed antimicrobial therapy. Is afebrile and clinically improved.  Hypertension: Controlled-continue metoprolol.  BPH: Continue Flomax  Chronic peripheral neuropathy: Probably secondary to alcohol use, continue Neurontin  Generalized deconditioning: Probably secondary to acute illness-suspect that patient does have chronic deconditioning at baseline due to alcohol use and neuropathy. Seen by physical therapy services and recommended CIR-up on CIR evaluation thought to be a candidate for SNF.  Procedures/Studies:  Intubated in ED 11/10. Extubated 11/11.   EEG 11/10: This EEG is consistent with sedation effect. There was no seizure or seizure predisposition recorded on this study. Please note that a normal EEG does not preclude the possibility of epilepsy.   LP 11/10: did not reveal evidence of  meningitis  Discharge Diagnoses:  Principal Problem:   Alcohol withdrawal seizure (HCC) Active Problems:   Altered mental status  Rhabdomyolysis   Hypertension   Dyslipidemia   Chronic kidney disease   Elevated liver enzymes   Depression   Anxiety   Acute respiratory failure with hypoxia (HCC)   ETOH abuse   Alcoholic peripheral neuropathy (HCC)   Non-traumatic rhabdomyolysis   AKI (acute kidney injury) (HCC)   Benign essential HTN   Fall   Community acquired pneumonia   Hypokalemia   Hypomagnesemia   Hypoalbuminemia due to protein-calorie malnutrition (HCC)   Transaminitis   Acute blood loss anemia   Discharge Instructions:  Activity:  As tolerated with Full fall precautions use walker/cane & assistance as needed   Discharge Instructions    Call MD for:  persistant dizziness or light-headedness    Complete by:  As directed    Call MD for:  persistant nausea and vomiting    Complete by:  As directed    Diet - low sodium heart healthy    Complete by:  As directed    Increase activity slowly    Complete by:  As directed        Medication List    STOP taking these medications   omeprazole 20 MG capsule Commonly known as:  PRILOSEC     TAKE these medications   atorvastatin 40 MG tablet Commonly known as:  LIPITOR Take 40 mg by mouth at bedtime.   cilostazol 50 MG tablet Commonly known as:  PLETAL Take 50 mg by mouth 2 (two) times daily.   CLEAR EYES OP Place 1 drop into both eyes daily.   famotidine 20 MG tablet Commonly known as:  PEPCID Take 1 tablet (20 mg total) by mouth daily.   feeding supplement (ENSURE ENLIVE) Liqd Take 237 mLs by mouth 3 (three) times daily between meals.   folic acid 1 MG tablet Commonly known as:  FOLVITE Take 1 tablet (1 mg total) by mouth daily. Start taking on:  04/19/2016   gabapentin 600 MG tablet Commonly known as:  NEURONTIN Take 1 tablet (600 mg total) by mouth 3 (three) times daily. What changed:  how  much to take  when to take this  additional instructions   LORazepam 0.5 MG tablet Commonly known as:  ATIVAN Take 0.5 mg by mouth at bedtime as needed (tremors/ withdrawal symptoms).   magnesium oxide 400 MG tablet Commonly known as:  MAG-OX Take 400 mg by mouth at bedtime.   metoprolol 50 MG tablet Commonly known as:  LOPRESSOR Take 50 mg by mouth 2 (two) times daily.   multivitamin with minerals Tabs tablet Take 1 tablet by mouth daily.   tamsulosin 0.4 MG Caps capsule Commonly known as:  FLOMAX Take 0.4 mg by mouth daily.   thiamine 100 MG tablet Take 1 tablet (100 mg total) by mouth daily. Start taking on:  04/19/2016      Follow-up Information    FAGAN,ROY, MD. Schedule an appointment as soon as possible for a visit in 1 week(s).   Specialty:  Internal Medicine Contact information: [address] [place] [address] [phone]          Allergies  Allergen Reactions  . Bee Venom Anaphylaxis  . Penicillins Other (See Comments)    Childhood allergic reaction - no other information available    Consultations:   pulmonary/intensive care and neurology   Other Procedures/Studies: Ct Head Wo Contrast  Result Date: 04/12/2016 CLINICAL DATA:  Unwitnessed seizure today. History of seizure disorder, but no seizure in 2 years. Fever, tachycardia and intubation. EXAM: CT  HEAD WITHOUT CONTRAST TECHNIQUE: Contiguous axial images were obtained from the base of the skull through the vertex without intravenous contrast. COMPARISON:  CT head 11/02/2011. FINDINGS: Brain: There is no evidence of acute intracranial hemorrhage, mass lesion, brain edema or extra-axial fluid collection. Stable mild generalized atrophy. There is no CT evidence of acute cortical infarction. Vascular: Intracranial vascular calcifications are present. Skull: Negative for fracture or focal lesion. Sinuses/Orbits: Patient is intubated. There is mild mucosal thickening in the ethmoid  sinuses. There is a small amount of fluid dependently in the sphenoid sinus. The frontal and maxillary sinuses are clear. The mastoid air cells and middle ears are clear. No orbital abnormalities are seen. Other: None. IMPRESSION: 1. No acute intracranial findings. 2. Mild paranasal sinus mucosal thickening and fluid attributed to intubation. Electronically Signed   By: Carey Bullocks M.D.   On: 04/12/2016 12:30   Dg Chest Port 1 View  Result Date: 04/13/2016 CLINICAL DATA:  ETT. EXAM: PORTABLE CHEST 1 VIEW COMPARISON:  April 12, 2016 FINDINGS: The ETT and NG tube remain, in good position. No pneumothorax. The opacity in the left lung base persists but is improved. No other interval changes. IMPRESSION: Stable support apparatus.  Improving left basilar opacity. Electronically Signed   By: Gerome Sam III M.D   On: 04/13/2016 07:30   Dg Chest Port 1 View  Result Date: 04/12/2016 CLINICAL DATA:  Endotracheal tube placement.  Status post seizure. EXAM: PORTABLE CHEST 1 VIEW COMPARISON:  04/12/2016 at 11:28 a.m. FINDINGS: Endotracheal tube tip now projects 3.4 cm above the Carina. Orogastric tube is stable passing below the diaphragm well into the stomach. There is discoid type atelectasis in the lower lung on the left. Mild streaky atelectasis is noted at the right lung base. Lungs otherwise clear. No convincing pleural effusion. No pneumothorax. IMPRESSION: 1. Endotracheal tube is well positioned, tip projecting 3.4 cm above the Carina. 2. Well-positioned nasal/orogastric tube. 3. Mild basilar atelectasis. Electronically Signed   By: Amie Portland M.D.   On: 04/12/2016 16:16   Dg Chest Port 1 View  Result Date: 04/12/2016 CLINICAL DATA:  Fever and tachycardia EXAM: PORTABLE CHEST 1 VIEW COMPARISON:  December 11, 2015 FINDINGS: There is no edema or consolidation. The heart size and pulmonary vascularity are normal. No adenopathy. There old healed rib fractures on the right. IMPRESSION: No edema or  consolidation. Electronically Signed   By: Bretta Bang III M.D.   On: 04/12/2016 11:18   Dg Chest Port 1v Same Day  Result Date: 04/12/2016 CLINICAL DATA:  Intubation and nasal/ orogastric tube placement. EXAM: PORTABLE CHEST 1 VIEW COMPARISON:  04/12/2016 FINDINGS: Endotracheal tube tip projects 4 cm above the Carina. Nasal/orogastric tube passes well below the diaphragm into the mid stomach. No other change from the prior study. No acute findings in the lungs. IMPRESSION: 1. Endotracheal tube tip projects 4 cm above the Carina. 2. Nasal/orogastric tube is well positioned passing well into the stomach. Electronically Signed   By: Amie Portland M.D.   On: 04/12/2016 11:42   Dg Swallowing Func-speech Pathology  Result Date: 04/16/2016 Objective Swallowing Evaluation: Type of Study: MBS-Modified Barium Swallow Study Patient Details Name: Tony Patterson MRN: [id number] Date of Birth: [birth year] Today's Date: 04/16/2016 Time: SLP Start Time (ACUTE ONLY): 1402-SLP Stop Time (ACUTE ONLY): 1421 SLP Time Calculation (min) (ACUTE ONLY): 19 min Past Medical History: Past Medical History: Diagnosis Date . Alcohol abuse  . Anemia  . Anxiety  . Hypertension  . Neuropathy (HCC)  .  Seizures (HCC)   unknown etiology and on no meds; been 4 years since seizure. Past Surgical History: Past Surgical History: Procedure Laterality Date . COLONOSCOPY  05/18/2007  [id number]:Internal hemorrhoids, a single anal papilla, otherwise normal/ Single polyp, as described above, removed  . COLONOSCOPY WITH PROPOFOL N/A 03/16/2015  Procedure: COLONOSCOPY WITH PROPOFOL;  Surgeon: Corbin Adeobert M Rourk, MD;  Location: [facility];  Service: Endoscopy;  Laterality: N/A;  in cecum at 1344. withdrawal time  8min . THROAT SURGERY    polyps removed x2 HPI: Pt is a 60 y.o. male with PMH of anemia, anxiety, seizures (last in 2015), neuropathy, and prior ETOH abuse who [facility] after being found altered at home. Pt was found to be febrile,  hypertensive, tachypneic, had witnessed seizure, and was intubated until 11/11. CXR today showed improving L basilar opacity. Bedside swallow eval ordered post-extubation.  Subjective: "I'm tired." Assessment / Plan / Recommendation CHL IP CLINICAL IMPRESSIONS 04/16/2016 Therapy Diagnosis WFL Clinical Impression Pt tolerated all consistencies trialed WNL. Point of clarification from earlier charting- pt's family had indicated that they thought pt had lh/o laryngeal ca, however pt indicated this is not true. 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 Recommend: Regular diet. No additional SLP services indicated at this time. Will upgrade diet and sign off. Pt in agreement with POC.  Impact on safety and function No limitations   CHL IP TREATMENT RECOMMENDATION 04/16/2016 Treatment Recommendations No treatment recommended at this time   Prognosis 04/16/2016 Prognosis for Safe Diet Advancement Good Barriers to Reach Goals -- Barriers/Prognosis Comment -- CHL IP DIET RECOMMENDATION 04/16/2016 SLP Diet Recommendations Regular solids;Thin liquid Liquid Administration via Cup;Straw Medication Administration Whole meds with liquid Compensations Slow rate;Small sips/bites;Minimize environmental distractions Postural Changes Seated upright at 90 degrees   CHL IP OTHER RECOMMENDATIONS 04/16/2016 Recommended Consults -- Oral Care Recommendations Oral care QID Other Recommendations --   CHL IP FOLLOW UP RECOMMENDATIONS 04/16/2016 Follow up Recommendations None   CHL IP FREQUENCY AND DURATION 04/13/2016 Speech Therapy Frequency (ACUTE ONLY) min 1 x/week Treatment Duration 1 week      CHL IP ORAL PHASE 04/16/2016 Oral Phase WFL Oral - Pudding Teaspoon -- Oral - Pudding Cup -- Oral - Honey Teaspoon -- Oral - Honey Cup -- Oral - Nectar Teaspoon -- Oral - Nectar Cup -- Oral - Nectar Straw -- Oral - Thin Teaspoon -- Oral - Thin Cup -- Oral - Thin Straw -- Oral - Puree -- Oral - Mech Soft -- Oral - Regular -- Oral  - Multi-Consistency -- Oral - Pill -- Oral Phase - Comment --  CHL IP PHARYNGEAL PHASE 04/16/2016 Pharyngeal Phase WFL Pharyngeal- Pudding Teaspoon -- Pharyngeal -- Pharyngeal- Pudding Cup -- Pharyngeal -- Pharyngeal- Honey Teaspoon -- Pharyngeal -- Pharyngeal- Honey Cup -- Pharyngeal -- Pharyngeal- Nectar Teaspoon -- Pharyngeal -- Pharyngeal- Nectar Cup -- Pharyngeal -- Pharyngeal- Nectar Straw -- Pharyngeal -- Pharyngeal- Thin Teaspoon -- Pharyngeal -- Pharyngeal- Thin Cup -- Pharyngeal -- Pharyngeal- Thin Straw -- Pharyngeal -- Pharyngeal- Puree -- Pharyngeal -- Pharyngeal- Mechanical Soft -- Pharyngeal -- Pharyngeal- Regular -- Pharyngeal -- Pharyngeal- Multi-consistency -- Pharyngeal -- Pharyngeal- Pill -- Pharyngeal -- Pharyngeal Comment --  CHL IP CERVICAL ESOPHAGEAL PHASE 04/16/2016 Cervical Esophageal Phase WFL Pudding Teaspoon -- Pudding Cup -- Honey Teaspoon -- Honey Cup -- Nectar Teaspoon -- Nectar Cup -- Nectar Straw -- Thin Teaspoon -- Thin Cup -- Thin Straw -- Puree -- Mechanical Soft -- Regular -- Multi-consistency -- Pill --  Cervical Esophageal Comment -- No flowsheet data found. Rocky Crafts MA, CCC-SLP Pager [phone] 04/16/2016, 3:39 PM                 TODAY-DAY OF DISCHARGE:  Subjective:   Tony Patterson today has no headache,no chest abdominal pain,no new weakness tingling or numbness, feels much better  Objective:   Blood pressure 133/81, pulse 95, temperature 97.4 F (36.3 C), temperature source Oral, resp. rate 15, height 6\' 3"  (1.905 m), weight 92.1 kg (203 lb 0.7 oz), SpO2 95 %.  Intake/Output Summary (Last 24 hours) at 04/18/16 1025 Last data filed at 04/18/16 0525  Gross per 24 hour  Intake              240 ml  Output             1850 ml  Net            -1610 ml   Filed Weights   04/13/16 0500 04/14/16 0339 04/15/16 0456  Weight: 95.2 kg (209 lb 14.1 oz) 91.1 kg (200 lb 13.4 oz) 92.1 kg (203 lb 0.7 oz)    Exam: Awake Alert, Oriented *3, No new F.N deficits,  Normal affect [place].AT,PERRAL Supple Neck,No JVD, No cervical lymphadenopathy appriciated.  Symmetrical Chest wall movement, Good air movement bilaterally, CTAB RRR,No Gallops,Rubs or new Murmurs, No Parasternal Heave +ve B.Sounds, Abd Soft, Non tender, No organomegaly appriciated, No rebound -guarding or rigidity. No Cyanosis, Clubbing or edema, No new Rash or bruise   PERTINENT RADIOLOGIC STUDIES: Ct Head Wo Contrast  Result Date: 04/12/2016 CLINICAL DATA:  Unwitnessed seizure today. History of seizure disorder, but no seizure in 2 years. Fever, tachycardia and intubation. EXAM: CT HEAD WITHOUT CONTRAST TECHNIQUE: Contiguous axial images were obtained from the base of the skull through the vertex without intravenous contrast. COMPARISON:  CT head 11/02/2011. FINDINGS: Brain: There is no evidence of acute intracranial hemorrhage, mass lesion, brain edema or extra-axial fluid collection. Stable mild generalized atrophy. There is no CT evidence of acute cortical infarction. Vascular: Intracranial vascular calcifications are present. Skull: Negative for fracture or focal lesion. Sinuses/Orbits: Patient is intubated. There is mild mucosal thickening in the ethmoid sinuses. There is a small amount of fluid dependently in the sphenoid sinus. The frontal and maxillary sinuses are clear. The mastoid air cells and middle ears are clear. No orbital abnormalities are seen. Other: None. IMPRESSION: 1. No acute intracranial findings. 2. Mild paranasal sinus mucosal thickening and fluid attributed to intubation. Electronically Signed   By: Carey Bullocks M.D.   On: 04/12/2016 12:30   Dg Chest Port 1 View  Result Date: 04/13/2016 CLINICAL DATA:  ETT. EXAM: PORTABLE CHEST 1 VIEW COMPARISON:  April 12, 2016 FINDINGS: The ETT and NG tube remain, in good position. No pneumothorax. The opacity in the left lung base persists but is improved. No other interval changes. IMPRESSION: Stable support apparatus.  Improving  left basilar opacity. Electronically Signed   By: Gerome Sam III M.D   On: 04/13/2016 07:30   Dg Chest Port 1 View  Result Date: 04/12/2016 CLINICAL DATA:  Endotracheal tube placement.  Status post seizure. EXAM: PORTABLE CHEST 1 VIEW COMPARISON:  04/12/2016 at 11:28 a.m. FINDINGS: Endotracheal tube tip now projects 3.4 cm above the Carina. Orogastric tube is stable passing below the diaphragm well into the stomach. There is discoid type atelectasis in the lower lung on the left. Mild streaky atelectasis is noted at the right lung base. Lungs otherwise clear. No  convincing pleural effusion. No pneumothorax. IMPRESSION: 1. Endotracheal tube is well positioned, tip projecting 3.4 cm above the Carina. 2. Well-positioned nasal/orogastric tube. 3. Mild basilar atelectasis. Electronically Signed   By: Amie Portland M.D.   On: 04/12/2016 16:16   Dg Chest Port 1 View  Result Date: 04/12/2016 CLINICAL DATA:  Fever and tachycardia EXAM: PORTABLE CHEST 1 VIEW COMPARISON:  December 11, 2015 FINDINGS: There is no edema or consolidation. The heart size and pulmonary vascularity are normal. No adenopathy. There old healed rib fractures on the right. IMPRESSION: No edema or consolidation. Electronically Signed   By: Bretta Bang III M.D.   On: 04/12/2016 11:18   Dg Chest Port 1v Same Day  Result Date: 04/12/2016 CLINICAL DATA:  Intubation and nasal/ orogastric tube placement. EXAM: PORTABLE CHEST 1 VIEW COMPARISON:  04/12/2016 FINDINGS: Endotracheal tube tip projects 4 cm above the Carina. Nasal/orogastric tube passes well below the diaphragm into the mid stomach. No other change from the prior study. No acute findings in the lungs. IMPRESSION: 1. Endotracheal tube tip projects 4 cm above the Carina. 2. Nasal/orogastric tube is well positioned passing well into the stomach. Electronically Signed   By: Amie Portland M.D.   On: 04/12/2016 11:42   Dg Swallowing Func-speech Pathology  Result Date:  04/16/2016 Objective Swallowing Evaluation: Type of Study: MBS-Modified Barium Swallow Study Patient Details Name: Tony Patterson MRN: [id number] Date of Birth: 24-Apr-1956 Today's Date: 04/16/2016 Time: SLP Start Time (ACUTE ONLY): 1402-SLP Stop Time (ACUTE ONLY): 1421 SLP Time Calculation (min) (ACUTE ONLY): 19 min Past Medical History: Past Medical History: Diagnosis Date . Alcohol abuse  . Anemia  . Anxiety  . Hypertension  . Neuropathy (HCC)  . Seizures (HCC)   unknown etiology and on no meds; been 4 years since seizure. Past Surgical History: Past Surgical History: Procedure Laterality Date . COLONOSCOPY  05/18/2007  [id number] hemorrhoids, a single anal papilla, otherwise normal/ Single polyp, as described above, removed  . COLONOSCOPY WITH PROPOFOL N/A 03/16/2015  Procedure: COLONOSCOPY WITH PROPOFOL;  Surgeon: Corbin Ade, MD;  Location: [facility];  Service: Endoscopy;  Laterality: N/A;  in cecum at 1344. withdrawal time  . THROAT SURGERY    polyps removed x2 HPI: Pt is a 60 y.o. male with PMH of anemia, anxiety, seizures (last in 2015), neuropathy, and prior ETOH abuse who presented to [facility] on 11/10 after being found altered at home. Pt was found to be febrile, hypertensive, tachypneic, had witnessed seizure, and was intubated until 11/11. CXR today showed improving L basilar opacity. Bedside swallow eval ordered post-extubation.  Subjective: "I'm tired." Assessment / Plan / Recommendation CHL IP CLINICAL IMPRESSIONS 04/16/2016 Therapy Diagnosis WFL Clinical Impression Pt tolerated all consistencies trialed WNL. Point of clarification from earlier charting- pt's family had indicated that they thought pt had lh/o laryngeal ca, however pt indicated this is not true. 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 Recommend: Regular diet. No additional SLP services indicated at this time. Will upgrade diet and sign off. Pt in agreement with POC.  Impact on safety and function No  limitations   CHL IP TREATMENT RECOMMENDATION 04/16/2016 Treatment Recommendations No treatment recommended at this time   Prognosis 04/16/2016 Prognosis for Safe Diet Advancement Good Barriers to Reach Goals -- Barriers/Prognosis Comment -- CHL IP DIET RECOMMENDATION 04/16/2016 SLP Diet Recommendations Regular solids;Thin liquid Liquid Administration via Cup;Straw Medication Administration Whole meds with liquid Compensations Slow rate;Small sips/bites;Minimize environmental distractions Postural Changes Seated upright  at 90 degrees   CHL IP OTHER RECOMMENDATIONS 04/16/2016 Recommended Consults -- Oral Care Recommendations Oral care QID Other Recommendations --   CHL IP FOLLOW UP RECOMMENDATIONS 04/16/2016 Follow up Recommendations None   CHL IP FREQUENCY AND DURATION 04/13/2016 Speech Therapy Frequency (ACUTE ONLY) min 1 x/week Treatment Duration 1 week      CHL IP ORAL PHASE 04/16/2016 Oral Phase WFL Oral - Pudding Teaspoon -- Oral - Pudding Cup -- Oral - Honey Teaspoon -- Oral - Honey Cup -- Oral - Nectar Teaspoon -- Oral - Nectar Cup -- Oral - Nectar Straw -- Oral - Thin Teaspoon -- Oral - Thin Cup -- Oral - Thin Straw -- Oral - Puree -- Oral - Mech Soft -- Oral - Regular -- Oral - Multi-Consistency -- Oral - Pill -- Oral Phase - Comment --  CHL IP PHARYNGEAL PHASE 04/16/2016 Pharyngeal Phase WFL Pharyngeal- Pudding Teaspoon -- Pharyngeal -- Pharyngeal- Pudding Cup -- Pharyngeal -- Pharyngeal- Honey Teaspoon -- Pharyngeal -- Pharyngeal- Honey Cup -- Pharyngeal -- Pharyngeal- Nectar Teaspoon -- Pharyngeal -- Pharyngeal- Nectar Cup -- Pharyngeal -- Pharyngeal- Nectar Straw -- Pharyngeal -- Pharyngeal- Thin Teaspoon -- Pharyngeal -- Pharyngeal- Thin Cup -- Pharyngeal -- Pharyngeal- Thin Straw -- Pharyngeal -- Pharyngeal- Puree -- Pharyngeal -- Pharyngeal- Mechanical Soft -- Pharyngeal -- Pharyngeal- Regular -- Pharyngeal -- Pharyngeal- Multi-consistency -- Pharyngeal -- Pharyngeal- Pill -- Pharyngeal -- Pharyngeal  Comment --  CHL IP CERVICAL ESOPHAGEAL PHASE 04/16/2016 Cervical Esophageal Phase WFL Pudding Teaspoon -- Pudding Cup -- Honey Teaspoon -- Honey Cup -- Nectar Teaspoon -- Nectar Cup -- Nectar Straw -- Thin Teaspoon -- Thin Cup -- Thin Straw -- Puree -- Mechanical Soft -- Regular -- Multi-consistency -- Pill -- Cervical Esophageal Comment -- No flowsheet data found. Rocky Crafts MA, CCC-SLP Pager [phone] 04/16/2016, 3:39 PM                PERTINENT LAB RESULTS: CBC: No results for input(s): WBC, HGB, HCT, PLT in the last 72 hours. CMET CMP     Component Value Date/Time   NA 137 04/17/2016 0530   NA 141 08/24/2015 0832   K 4.4 04/17/2016 0530   CL 99 (L) 04/17/2016 0530   CO2 30 04/17/2016 0530   GLUCOSE 133 (H) 04/17/2016 0530   BUN 22 (H) 04/17/2016 0530   BUN 16 08/24/2015 0832   CREATININE 2.17 (H) 04/17/2016 0530   CALCIUM 8.5 (L) 04/17/2016 0530   PROT 5.7 (L) 04/17/2016 0530   PROT 6.6 08/24/2015 0832   ALBUMIN 2.2 (L) 04/17/2016 0530   AST 66 (H) 04/17/2016 0530   ALT 62 04/17/2016 0530   ALKPHOS 59 04/17/2016 0530   BILITOT 0.7 04/17/2016 0530   GFRNONAA 32 (L) 04/17/2016 0530   GFRAA 37 (L) 04/17/2016 0530    GFR Estimated Creatinine Clearance: 43.8 mL/min (by C-G formula based on SCr of 2.17 mg/dL (H)). No results for input(s): LIPASE, AMYLASE in the last 72 hours.  Recent Labs  04/16/16 0528  CKTOTAL 2,224*   Invalid input(s): POCBNP No results for input(s): DDIMER in the last 72 hours. No results for input(s): HGBA1C in the last 72 hours. No results for input(s): CHOL, HDL, LDLCALC, TRIG, CHOLHDL, LDLDIRECT in the last 72 hours. No results for input(s): TSH, T4TOTAL, T3FREE, THYROIDAB in the last 72 hours.  Invalid input(s): FREET3 No results for input(s): VITAMINB12, FOLATE, FERRITIN, TIBC, IRON, RETICCTPCT in the last 72 hours. Coags: No results for input(s): INR in the last 72 hours.  Invalid input(s): PT Microbiology: Recent Results (from the  past 240 hour(s))  Urine culture     Status: None   Collection Time: 04/12/16 10:46 AM  Result Value Ref Range Status   Specimen Description URINE, RANDOM  Final   Special Requests NONE  Final   Culture NO GROWTH Performed at [facility]   Final   Report Status 04/14/2016 FINAL  Final  Blood Culture (routine x 2)     Status: Abnormal   Collection Time: 04/12/16 10:52 AM  Result Value Ref Range Status   Specimen Description BLOOD BLOOD LEFT ARM  Final   Special Requests BOTTLES DRAWN AEROBIC AND ANAEROBIC 6cc each  Final   Culture  Setup Time   Final    GRAM POSITIVE COCCI Gram Stain Report Called to,Read Back By and Verified With: TUTTLE S. AT [facility] ON [id number] AT 0710A BY THOMPSON S. IN BOTH AEROBIC AND ANAEROBIC BOTTLES CRITICAL RESULT CALLED TO, READ BACK BY AND VERIFIED WITH: M MACCIA,PHARMD AT 1003 04/13/16 BY L BENFIELD    Culture (A)  Final    STAPHYLOCOCCUS SPECIES (COAGULASE NEGATIVE) THE SIGNIFICANCE OF ISOLATING THIS ORGANISM FROM A SINGLE SET OF BLOOD CULTURES WHEN MULTIPLE SETS ARE DRAWN IS UNCERTAIN. PLEASE NOTIFY THE MICROBIOLOGY DEPARTMENT WITHIN ONE WEEK IF SPECIATION AND SENSITIVITIES ARE REQUIRED. Performed at [facility]    Report Status 04/15/2016 FINAL  Final  Blood Culture ID Panel (Reflexed)     Status: Abnormal   Collection Time: 04/12/16 10:52 AM  Result Value Ref Range Status   Enterococcus species NOT DETECTED NOT DETECTED Final   Listeria monocytogenes NOT DETECTED NOT DETECTED Final   Staphylococcus species DETECTED (A) NOT DETECTED Final    Comment: CRITICAL RESULT CALLED TO, READ BACK BY AND VERIFIED WITH: M MACCIA,PHARMD AT 1003 04/13/16 BY L BENFIELD    Staphylococcus aureus NOT DETECTED NOT DETECTED Final   Methicillin resistance NOT DETECTED NOT DETECTED Final   Streptococcus species NOT DETECTED NOT DETECTED Final   Streptococcus agalactiae NOT DETECTED NOT DETECTED Final   Streptococcus pneumoniae NOT DETECTED NOT DETECTED Final    Streptococcus pyogenes NOT DETECTED NOT DETECTED Final   Acinetobacter baumannii NOT DETECTED NOT DETECTED Final   Enterobacteriaceae species NOT DETECTED NOT DETECTED Final   Enterobacter cloacae complex NOT DETECTED NOT DETECTED Final   Escherichia coli NOT DETECTED NOT DETECTED Final   Klebsiella oxytoca NOT DETECTED NOT DETECTED Final   Klebsiella pneumoniae NOT DETECTED NOT DETECTED Final   Proteus species NOT DETECTED NOT DETECTED Final   Serratia marcescens NOT DETECTED NOT DETECTED Final   Haemophilus influenzae NOT DETECTED NOT DETECTED Final   Neisseria meningitidis NOT DETECTED NOT DETECTED Final   Pseudomonas aeruginosa NOT DETECTED NOT DETECTED Final   Candida albicans NOT DETECTED NOT DETECTED Final   Candida glabrata NOT DETECTED NOT DETECTED Final   Candida krusei NOT DETECTED NOT DETECTED Final   Candida parapsilosis NOT DETECTED NOT DETECTED Final   Candida tropicalis NOT DETECTED NOT DETECTED Final    Comment: Performed at [facility]  Blood Culture (routine x 2)     Status: None   Collection Time: 04/12/16 11:07 AM  Result Value Ref Range Status   Specimen Description BLOOD RIGHT ANTECUBITAL  Final   Special Requests BOTTLES DRAWN AEROBIC AND ANAEROBIC 6cc each  Final   Culture NO GROWTH 5 DAYS  Final   Report Status 04/17/2016 FINAL  Final  Culture, respiratory (NON-Expectorated)     Status: None  Collection Time: 04/12/16  5:06 PM  Result Value Ref Range Status   Specimen Description TRACHEAL ASPIRATE  Final   Special Requests NONE  Final   Gram Stain   Final    MODERATE WBC PRESENT, PREDOMINANTLY PMN FEW GRAM NEGATIVE RODS RARE GRAM POSITIVE COCCI IN PAIRS AND CHAINS RARE GRAM POSITIVE COCCI IN CLUSTERS RARE GRAM NEGATIVE DIPLOCOCCI    Culture Consistent with normal respiratory flora.  Final   Report Status 04/15/2016 FINAL  Final  CSF culture     Status: None   Collection Time: 04/12/16  5:43 PM  Result Value Ref Range Status   Specimen  Description CSF  Final   Special Requests NONE  Final   Gram Stain   Final    WBC PRESENT, PREDOMINANTLY MONONUCLEAR NO ORGANISMS SEEN CYTOSPIN SMEAR    Culture NO GROWTH 3 DAYS  Final   Report Status 04/16/2016 FINAL  Final  MRSA PCR Screening     Status: None   Collection Time: 04/12/16  5:44 PM  Result Value Ref Range Status   MRSA by PCR NEGATIVE NEGATIVE Final    Comment:        The GeneXpert MRSA Assay (FDA approved for NASAL specimens only), is one component of a comprehensive MRSA colonization surveillance program. It is not intended to diagnose MRSA infection nor to guide or monitor treatment for MRSA infections.     FURTHER DISCHARGE INSTRUCTIONS:  Get Medicines reviewed and adjusted: Please take all your medications with you for your next visit with your Primary MD  Laboratory/radiological data: Please request your Primary MD to go over all hospital tests and procedure/radiological results at the follow up, please ask your Primary MD to get all Hospital records sent to his/her office.  In some cases, they will be blood work, cultures and biopsy results pending at the time of your discharge. Please request that your primary care M.D. goes through all the records of your hospital data and follows up on these results.  Also Note the following: If you experience worsening of your admission symptoms, develop shortness of breath, life threatening emergency, suicidal or homicidal thoughts you must seek medical attention immediately by calling 911 or calling your MD immediately  if symptoms less severe.  You must read complete instructions/literature along with all the possible adverse reactions/side effects for all the Medicines you take and that have been prescribed to you. Take any new Medicines after you have completely understood and accpet all the possible adverse reactions/side effects.   Do not drive when taking Pain medications or sleeping medications  (Benzodaizepines)  Do not take more than prescribed Pain, Sleep and Anxiety Medications. It is not advisable to combine anxiety,sleep and pain medications without talking with your primary care practitioner  Special Instructions: If you have smoked or chewed Tobacco  in the last 2 yrs please stop smoking, stop any regular Alcohol  and or any Recreational drug use.  Wear Seat belts while driving.  Please note: You were cared for by a hospitalist during your hospital stay. Once you are discharged, your primary care physician will handle any further medical issues. Please note that NO REFILLS for any discharge medications will be authorized once you are discharged, as it is imperative that you return to your primary care physician (or establish a relationship with a primary care physician if you do not have one) for your post hospital discharge needs so that they can reassess your need for medications and monitor your lab values.  Total Time spent coordinating discharge including counseling, education and face to face time equals 45 minutes  Signed: Jeoffrey Massed 04/18/2016 10:25 AM

## 2016-04-18 NOTE — Clinical Social Work Placement (Signed)
   CLINICAL SOCIAL WORK PLACEMENT  NOTE  Date:  04/18/2016  Patient Details  Name: Tony Patterson MRN: [id number] Date of Birth: 06-25-1955  Clinical Social Work is seeking post-discharge placement for this patient at the Skilled  Nursing Facility level of care (*CSW will initial, date and re-position this form in  chart as items are completed):  Yes   Patient/family provided with [facility] Clinical Social Work Department's list of facilities offering this level of care within the geographic area requested by the patient (or if unable, by the patient's family).  Yes   Patient/family informed of their freedom to choose among providers that offer the needed level of care, that participate in Medicare, Medicaid or managed care program needed by the patient, have an available bed and are willing to accept the patient.  Yes   Patient/family informed of [facility]'s ownership interest in [facility] and [facility], as well as of the fact that they are under no obligation to receive care at these facilities.  PASRR submitted to EDS on 04/16/16     PASRR number received on       Existing PASRR number confirmed on       FL2 transmitted to all facilities in geographic area requested by pt/family on 04/16/16     FL2 transmitted to all facilities within larger geographic area on       Patient informed that his/her managed care company has contracts with or will negotiate with certain facilities, including the following:        Yes   Patient/family informed of bed offers received.  [facility]     Physician recommends and patient chooses bed at      Patient to be transferred to [facility] on 04/18/16.  Patient to be transferred to facility by Nephew by car     Patient family notified on 04/18/16 of transfer.  Name of family member notified:  Nephew     PHYSICIAN       Additional Comment:     _______________________________________________ Mearl LatinNadia S Cherita Hebel, LCSWA 04/18/2016, 11:13 AM

## 2016-04-18 NOTE — Progress Notes (Signed)
Patient will DC to: [facility] Anticipated DC date: 04/18/16 Family notified: Printmakerephew Transport by: Aurther LoftNephew by car   Per MD patient ready for [facility]. RN, patient, patient's family, and facility notified of DC. Discharge Summary sent to facility. RN given number for report.   CSW signing off.  Cristobal GoldmannNadia Estus Krakowski, [place] Clinical Social Worker [phone]

## 2016-04-18 NOTE — Progress Notes (Signed)
Pasrr #: [id number] Drema Balzarine   Loray Akard LCSWA [id number]

## 2016-04-18 NOTE — Progress Notes (Signed)
Pt prepared for d/c to SNF. IV d/c'd. Skin intact except as charted in most recent assessments. Vitals are stable. Report called to receiving facility. Pt to be transported by nephew to [facility].

## 2016-04-22 ENCOUNTER — Ambulatory Visit: Payer: Managed Care, Other (non HMO) | Admitting: Physical Therapy

## 2016-04-24 ENCOUNTER — Ambulatory Visit: Payer: Managed Care, Other (non HMO) | Admitting: Physical Therapy

## 2016-04-30 ENCOUNTER — Ambulatory Visit: Payer: Managed Care, Other (non HMO) | Admitting: Physical Therapy

## 2016-05-02 ENCOUNTER — Ambulatory Visit: Payer: Managed Care, Other (non HMO) | Admitting: Physical Therapy

## 2016-05-07 ENCOUNTER — Ambulatory Visit: Payer: Managed Care, Other (non HMO) | Admitting: Physical Therapy

## 2016-05-09 ENCOUNTER — Ambulatory Visit: Payer: Managed Care, Other (non HMO) | Admitting: Physical Therapy

## 2016-06-20 ENCOUNTER — Ambulatory Visit: Payer: Managed Care, Other (non HMO) | Admitting: Neurology

## 2016-06-21 ENCOUNTER — Telehealth: Payer: Self-pay | Admitting: *Deleted

## 2016-06-21 NOTE — Telephone Encounter (Signed)
LVM for pt to call and r/s 06/20/16 appt since it was cx due to weather. ELK 06/21/16

## 2016-07-03 ENCOUNTER — Ambulatory Visit (INDEPENDENT_AMBULATORY_CARE_PROVIDER_SITE_OTHER): Payer: Managed Care, Other (non HMO) | Admitting: Neurology

## 2016-07-03 ENCOUNTER — Encounter: Payer: Self-pay | Admitting: Neurology

## 2016-07-03 VITALS — BP 144/93 | HR 71 | Ht 75.0 in | Wt 224.0 lb

## 2016-07-03 DIAGNOSIS — M5440 Lumbago with sciatica, unspecified side: Secondary | ICD-10-CM

## 2016-07-03 DIAGNOSIS — R531 Weakness: Secondary | ICD-10-CM | POA: Diagnosis not present

## 2016-07-03 DIAGNOSIS — G621 Alcoholic polyneuropathy: Secondary | ICD-10-CM

## 2016-07-03 DIAGNOSIS — G629 Polyneuropathy, unspecified: Secondary | ICD-10-CM | POA: Diagnosis not present

## 2016-07-03 DIAGNOSIS — R269 Unspecified abnormalities of gait and mobility: Secondary | ICD-10-CM

## 2016-07-03 MED ORDER — GABAPENTIN 600 MG PO TABS: ORAL_TABLET | ORAL | 6 refills | Status: DC

## 2016-07-03 NOTE — Patient Instructions (Signed)
Remember to drink plenty of fluid, eat healthy meals and do not skip any meals. Try to eat protein with a every meal and eat a healthy snack such as fruit or nuts in between meals. Try to keep a regular sleep-wake schedule and try to exercise daily, particularly in the form of walking, 20-30 minutes a day, if you can.   As far as your medications are concerned, I would like to suggest: Gabapentin 60mg  twice daily and 1200mg  at night.  I would like to see you back in 6 months, sooner if we need to. Please call us with any interim questions, concerns, problems, updates or refill requests.   Our phone number is [phone]. We also have an after hours call service for urgent matters and there is a physician on-call for urgent questions. For any emergencies you know to call 911 or go to the nearest emergency room

## 2016-07-03 NOTE — Progress Notes (Signed)
[facility]    Provider:  Dr Lucia Gaskins Referring Provider: Carylon Perches, MD Primary Care Physician:  Carylon Perches, MD  CC: foot pain  Interval history 07/03/2016: Patient here for followup of alcholic neuropathy. Recently hospitalized for alcohol withdrawal seizures and discharged to [facility] 04/18/2016. 85 days sober. Went to inpatient therapy. He is going to AA. He has a great sponsor who has been sober 6 years. Brother is here and says things are wonderful. Feels his memory is better. He is having pain in the big toes, throbbin like they want to rot off the rest of the feet are numb. Legs throb at night worse at night. During the day he is fine. He is taking the gabapentin 3x a day 600mg . Increase gabapentin to 1200 a night. Also PT.  Interval history 02/22/2016: Knee has been hurting for 8 weeks with swelling. A cortisone shot did not help. Xrays showed some arthritis. Patient says he fell and the right knee bent all the way back. Since then swelling and pain. He broke his ankle 7 weeks ago also after a fall he tripped over the threhold and also injured his knee. He decided to retire. He stopped drinking. He was given lorazepam for 3 days. Knee brace is helping. His feet are mostly numb. He does have some pain on the bottom of his feet. His big toes hurt a lot. The right leg is swollen below the knee and he has pain in his calf. [place] 15 hours straight without ambulating. Will refer to physical therapy for gait abnormality, u/s to eval for DVT and referral to [facility].  Interval history 08/24/2015: His feet are mostly numb, they don't really hurt. He has checked circulation. His gralise is at 1200mg . He increased about 2 months ago. The Gralise is stil working for him. He sleeps well. No significant pain in the feet. He continuous alcohol. He drinks bourbon and water at night. He goes through a half gallon of bourbon a week as well as  beer. Discussed alcohol as a nerve toxin, patient acknowledges and understands the sequelae of alcoholism as far as his neuropathy is concerned.  HPI: Tony Patterson is a 61 y.o. male here as a referral from Dr. [name] for peripheral neuropathy. PMHx of long-standing continous alcohol abuse.   Feet have been numb for a year. Progressing. Also tingling. Not burning. Mainly in the toes and balls of feet. Worse when working or on feet. Has LBP and radicular symptoms. And cramping in the toes at night. Balance is worsening. Hasn't taken any medication to try and help with the symptoms. Getting off his feet makes the pain better. Severe and symptoms can get up to 8/10 in pain. No Fhx of neuromuscular disorders or CMT or neuropathy. Denies ever taking medications for chemotherapy, no medications for gout, no isoniazid, flagyl or any new medications at onset. No inciting factors. He has 3 drinks every weekday of bourbon and a pint daily on the weekends. He is falling, tripping. He rolls his ankles. Had a seizure one year ago and was taken to [facility], may have been alcohol related. No episodes for a year since taken to [facility] per patient.   Reviewed notes, labs and imaging from outside physicians, which showed: PMHx of CKD, Depression, HLD, HTN and alcohol abuse with continuous drinking behavior. Dr. [name] notes continuous alcholic consumption with elevated liver enzymes, drinks bourbon, HTN and HLD well controlled on metoprolol  and statin. Last CBC and CMP significant for ALT 58 and AST 148 with a GFR of 81. Personally reviewed CT of the head from 11/2011, no large ischemic events, masses or tumors some questionably increased atrophy at the cerebellar vermis more than stated age.   EMG/NCS 03/2014: There is electrophysiologic evidence of a symmetric length-dependent severe axonal sensorimotor polyneuropathy.   Labs: TSH, HIV, ANA, IFE, RPR, heavy metals, B12, MMA, B1, all unremarkable in  2015   Social History   Social History  . Marital status: Divorced    Spouse name: N/A  . Number of children: 0  . Years of education: college 1   Occupational History  . 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 Smoking status: Former Smoker    Packs/day: 0.50    Years: 20.00    Types: Cigarettes    Quit date: 06/03/1995  . Smokeless tobacco: Never Used  . Alcohol use 18.0 oz/week    30 Shots of liquor per week     Comment: daily liquor (about 2 drinks most evenings); beer occasionally; Previously drank more.  . Drug use: No  . Sexual activity: No   Other Topics Concern  . Not on file   Social History Narrative   Caffeine use: Coke (caffeine free) ocass    Family History  Problem Relation Age of Onset  . Fibromyalgia Mother   . Dementia Mother   . Congestive Heart Failure Mother   . Congestive Heart Failure Father   . Heart Problems Father   . Hypertension Brother   . Hypertension Brother   . Neuropathy Neg Hx   . Colon cancer Neg Hx     Past Medical History:  Diagnosis Date  . Alcohol abuse   . Anemia   . Anxiety   . Hypertension   . Neuropathy (HCC)   . Seizures (HCC)    unknown etiology and on no meds; been 4 years since seizure.    Past Surgical History:  Procedure Laterality Date  . COLONOSCOPY  05/18/2007   WUJ:[id number]:Internal hemorrhoids, a single anal papilla, otherwise normal/ Single polyp, as described above, removed   . COLONOSCOPY WITH PROPOFOL N/A 03/16/2015   Procedure: COLONOSCOPY WITH PROPOFOL;  Surgeon: Corbin Adeobert M Rourk, MD;  Location: [facility];  Service: Endoscopy;  Laterality: N/A;  in cecum at 1344. withdrawal time  8min  . THROAT SURGERY     polyps removed x2    Current Outpatient Prescriptions  Medication Sig Dispense Refill  . atorvastatin (LIPITOR) 40 MG tablet Take 40 mg by mouth at bedtime.    . cilostazol (PLETAL) 50 MG tablet Take 50 mg by mouth 2 (two) times daily.    . feeding  supplement, ENSURE ENLIVE, (ENSURE ENLIVE) LIQD Take 237 mLs by mouth 3 (three) times daily between meals.    . folic acid (FOLVITE) 1 MG tablet Take 1 tablet (1 mg total) by mouth daily.    Marland Kitchen. gabapentin (NEURONTIN) 600 MG tablet Take 1 tablet (600 mg total) by mouth 3 (three) times daily. (Patient taking differently: Take 600-1,200 mg by mouth See admin instructions. Take 1 tablet (600 mg) by mouth every morning and 2 tablets (1200 mg) at bedtime) 90 tablet 12  . magnesium oxide (MAG-OX) 400 MG tablet Take 400 mg by mouth at bedtime.    . metoprolol (LOPRESSOR) 50 MG tablet Take 50 mg by mouth 2 (two) times daily.     . Multiple Vitamin (MULTIVITAMIN  WITH MINERALS) TABS tablet Take 1 tablet by mouth daily.    . Naphazoline HCl (CLEAR EYES OP) Place 1 drop into both eyes daily.    Marland Kitchen omeprazole (PRILOSEC) 10 MG capsule Take 10 mg by mouth daily.     No current facility-administered medications for this visit.     Allergies as of 07/03/2016 - Review Complete 07/03/2016  Allergen Reaction Noted  . Bee venom Anaphylaxis 10/08/2011  . Penicillins Other (See Comments) 10/08/2011    Vitals: BP (!) 144/93 (BP Location: Right Arm, Patient Position: Sitting, Cuff Size: Normal)   Pulse 71   Ht 6\' 3"  (1.905 m)   Wt 224 lb (101.6 kg)   BMI 28.00 kg/m  Last Weight:  Wt Readings from Last 1 Encounters:  07/03/16 224 lb (101.6 kg)   Last Height:   Ht Readings from Last 1 Encounters:  07/03/16 6\' 3"  (1.905 m)    Physical exam: Exam: Gen: NAD, conversant, well nourised, overweigh  Eyes: Conjunctivae clear without exudates or hemorrhage  Neuro: Detailed Neurologic Exam  Speech:  Speech is normal; fluent and spontaneous with normal comprehension.  Cognition:  The patient is oriented to person, place, and time;   Cranial Nerves:  The pupils are equal, round, and reactive to light. Extraocular movements are intact. Trigeminal sensation is intact and the  muscles of mastication are normal. The face is symmetric. The palate elevates in the midline. Voice is normal. Shoulder shrug is normal. The tongue has normal motion without fasciculations.   Gait:  Wide based, ataxic, imbalance with tandem  Motor Observation:  Postural tremor. Atrophy of the distal legs, tapering Tone:  Normal muscle tone.   Posture:  Posture is normal. normal erect   Strength: LE prox weakness otherwise strength is V/V in the upper and lower limbs.    Sensation:   Decreased cold and pp to below knees and below elbows Absent vibaration at great toes, 8 seconds at medial malleoli proprioception intact  DTRs: Absent AJs   Assessment/Plan: 61 year old male with a PMHx of long-standing continous alcohol abuse(recently stopped 85 days sober) who is here for follow up of worsening alcoholic peripheral neuropathy. Exam significant for absent acilles DTRs, atrophy of the distal legs and tapering, decreased cold and pp to below knees and below wrists, absent vibaration at great toes. 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 EMG/NCS with severe axonal polyneuropathy.   - Increase gabapentin to 1200mg  at night will check a bmp today for renal functiom - PT gait and safety and balace for falls. Fall risk.  Also back pain and difficulty getting out of chairs and weakness. Will reorder as he did not finish it last time and he has worsened gait, weakness. - In 6 months can further address memory problems and low back pain if needed  Naomie Dean, MD  [facility] [address] [place], [place] [address]  Phone [phone] Fax [phone]  A total of 35 minutes was spent face-to-face with this patient. Over half this time was spent on counseling patient on the peripheral neuropathy, knee pain, leg swelling diagnosis and different diagnostic and therapeutic options available

## 2016-07-04 ENCOUNTER — Telehealth: Payer: Self-pay | Admitting: *Deleted

## 2016-07-04 LAB — BASIC METABOLIC PANEL
BUN/Creatinine Ratio: 15 (ref 9–20)
BUN: 22 mg/dL (ref 6–24)
CALCIUM: 9.4 mg/dL (ref 8.7–10.2)
CO2: 28 mmol/L (ref 18–29)
CREATININE: 1.48 mg/dL — AB (ref 0.76–1.27)
Chloride: 103 mmol/L (ref 96–106)
GFR calc Af Amer: 59 mL/min/{1.73_m2} — ABNORMAL LOW (ref >59)
GFR, EST NON AFRICAN AMERICAN: 51 mL/min/{1.73_m2} — AB (ref >59)
GLUCOSE: 106 mg/dL — AB (ref 65–99)
POTASSIUM: 5.7 mmol/L — AB (ref 3.5–5.2)
Sodium: 143 mmol/L (ref 134–144)

## 2016-07-04 NOTE — Telephone Encounter (Signed)
Called and spoke to pt about lab results per AA,MD note. He verbalized understanding. Advised I will forward results to his PCP.  Sent lab results via EPIC to PCP.

## 2016-07-04 NOTE — Telephone Encounter (Signed)
-----   Message from Anson FretAntonia B Ahern, MD sent at 07/04/2016 10:19 AM EST ----- Patient's creatinine is 1.48 which is elevated but improved from 2 months ago (it was 2.17) so this is good news. He needs to continue to follow with his pcp for this however. His creatinine clearance is good at 77 so there is no problem with taking gabapentin at the dose prescribed. Thanks.

## 2016-07-09 ENCOUNTER — Ambulatory Visit (HOSPITAL_COMMUNITY): Payer: Managed Care, Other (non HMO) | Attending: Neurology | Admitting: Physical Therapy

## 2016-07-09 DIAGNOSIS — R2681 Unsteadiness on feet: Secondary | ICD-10-CM

## 2016-07-09 DIAGNOSIS — M6281 Muscle weakness (generalized): Secondary | ICD-10-CM | POA: Diagnosis present

## 2016-07-09 DIAGNOSIS — R209 Unspecified disturbances of skin sensation: Secondary | ICD-10-CM | POA: Diagnosis present

## 2016-07-09 DIAGNOSIS — R2689 Other abnormalities of gait and mobility: Secondary | ICD-10-CM | POA: Insufficient documentation

## 2016-07-09 NOTE — Patient Instructions (Addendum)
Heel Raise: Bilateral (Standing)    Rise on balls of feet. Repeat 10____ times per set. Do _1___ sets per session. Do __2__ sessions per day.  http://orth.exer.us/38   Copyright  VHI. All rights reserved.  Strengthening: Straight Leg Raise (Phase 1)    Tighten muscles on front of left  thigh, then lift leg _12___ inches from surface, keeping knee locked.  Repeat __10__ times per set. Do _1___ sets per session. Do ____2 sessions per day.  http://orth.exer.us/614   Copyright  VHI. All rights reserved.  Strengthening: Hip Abduction (Side-Lying)    Tighten muscles on front of left thigh, then lift leg __15__ inches from surface, keeping knee locked.  Repeat on the right side.  Repeat __10__ times per set. Do __1__ sets per session. Do __2__ sessions per day.  http://orth.exer.us/622   Copyright  VHI. All rights reserved.  Straight Leg Raise (Prone)    Abdomen and head supported, keep left knee locked and raise leg at hip. Avoid arching low back. Repeat __10__ times per set. Do ___1_ sets per session. Do __2__ sessions per day.  http://orth.exer.us/1112   Copyright  VHI. All rights reserved.

## 2016-07-09 NOTE — Therapy (Addendum)
[facility] [address] [place], [place], [address] Phone: [phone]   Fax:  [phone]  Physical Therapy Evaluation  Patient Details  Name: Tony Patterson MRN: [id number] Date of Birth: 01-Nov-1955 Referring Provider: Naomie DeanAntonia Ahern  Encounter Date: 07/09/2016      PT End of Session - 07/09/16 1511    Visit Number 1   Number of Visits 8   Date for PT Re-Evaluation 08/08/16   Authorization Type [employer]et[employer]    Authorization - Visit Number 1   Authorization - Number of Visits 8   PT Start Time 1430   PT Stop Time 1510   PT Time Calculation (min) 40 min   Activity Tolerance Patient tolerated treatment well   Behavior During Therapy [facility] for tasks assessed/performed      Past Medical History:  Diagnosis Date  . Alcohol abuse   . Anemia   . Anxiety   . Hypertension   . Neuropathy (HCC)   . Seizures (HCC)    unknown etiology and on no meds; been 4 years since seizure.    Past Surgical History:  Procedure Laterality Date  . COLONOSCOPY  05/18/2007   XBM:[id number]:Internal hemorrhoids, a single anal papilla, otherwise normal/ Single polyp, as described above, removed   . COLONOSCOPY WITH PROPOFOL N/A 03/16/2015   Procedure: COLONOSCOPY WITH PROPOFOL;  Surgeon: Corbin Adeobert M Rourk, MD;  Location: [facility];  Service: Endoscopy;  Laterality: N/A;  in cecum at 1344. withdrawal time  8min  . THROAT SURGERY     polyps removed x2    There were no vitals filed for this visit.       Subjective Assessment - 07/09/16 1502    Subjective Tony Patterson is a 61 yo male who was hospitalized on 04/10/2016 with seizures he was then discharged to a skilled nursing home until 04/25/2017.  He is now being referred for abnormal gait.  He states that he has difficulty going up and down steps.      Pertinent History alcoholic peripheral neuropathy, B LBP with sciatica; seizures,HTN, fx Rt ankle 2017    How long can you sit comfortably? no problem   How long can you stand  comfortably? no problem limited more from his back than his feet.    How long can you walk comfortably? uses a walking stick able to walk for a mile    Patient Stated Goals to decrease numbness and improve mobity    Currently in Pain? No/denies            [facility] PT Assessment - 07/09/16 0001      Assessment   Medical Diagnosis Peripheral neuropathy    Referring Provider Naomie DeanAntonia Ahern   Onset Date/Surgical Date 07/23/12   Next MD Visit 01/08/2017   Prior Therapy none     Precautions   Precautions Fall     Restrictions   Weight Bearing Restrictions No     Balance Screen   Has the patient fallen in the past 6 months Yes   How many times? 1   Has the patient had a decrease in activity level because of a fear of falling?  No   Is the patient reluctant to leave their home because of a fear of falling?  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 Functional Limits for tasks assessed     Functional Tests   Functional tests Single leg stance;Sit to Stand     Single Leg Stance   Comments Rt 1 sec; Lt 7 second      Sit to Stand   Comments 5 sit to stand 13.28     Posture/Postural Control   Posture/Postural Control Postural limitations   Postural Limitations Rounded Shoulders;Forward head;Increased thoracic kyphosis     ROM / Strength   AROM / PROM / Strength Strength     Strength   Strength Assessment Site Hip;Knee;Ankle   Right/Left Hip Right;Left   Right Hip Flexion 5/5   Right Hip Extension 4+/5   Right Hip ABduction 3+/5   Left Hip Flexion 3+/5   Left Hip Extension 3/5   Left Hip ABduction 3+/5   Right/Left Knee Right;Left   Right Knee Flexion 5/5   Right Knee Extension 4/5   Left Knee Flexion 4/5   Left Knee Extension 4/5   Right/Left Ankle  Right;Left   Right Ankle Dorsiflexion 5/5   Right Ankle Plantar Flexion 2+/5   Left Ankle Dorsiflexion 4/5   Left Ankle Plantar Flexion 2+/5            07/09/16 0001  Posture/Postural Control  Posture/Postural Control Postural limitations  Postural Limitations Rounded Shoulders;Forward head;Increased thoracic kyphosis  Exercises  Exercises Knee/Hip  Knee/Hip Exercises: Standing  Heel Raises Both;5 reps  SLS B  Knee/Hip Exercises: Supine  Straight Leg Raises Left;5 reps  Knee/Hip Exercises: Sidelying  Hip ABduction Both;10 reps  Knee/Hip Exercises: Prone  Straight Leg Raises Both;5 reps                     PT Education - 07/09/16 1511    Education provided Yes   Education Details HEP   Person(s) Educated Patient   Methods Explanation   Comprehension Verbalized understanding          PT Short Term Goals - 07/09/16 1516      PT SHORT TERM GOAL #1   Title Pt single leg stance to be 15 seconds on both legs to decrease risk of falling    Time 2   Period Weeks   Status New     PT SHORT TERM GOAL #2   Title Pt to be able to complete  5 sit to stand in 11 seconds to demonstrate improved power to balance to allow getting up from low couches with ease.    Time 2   Period Weeks   Status New           PT Long Term Goals - 07/09/16 1518      PT LONG TERM GOAL #1   Title Pt to be able to complete single leg stance on both legs for up to 30 seconds to reduce risk of falling    Time 4   Period Weeks   Status New     PT LONG TERM GOAL #2   Title Pt muscular strength in LE to be at least 4+/5 to allow pt to be able to ascend and descend one flight of steps without difficulty.    Time 4   Period Weeks   Status New     PT LONG TERM GOAL #3   Title Pt to be walking for 1.5 miles at least three days a week to return to  prior functional activities.    Time 4   Period Weeks   Status New               Plan - 07/09/16 1512    Clinical  Impression Statement Tony Patterson is a 61 yo male who has peripheral neuropathy that has affected his gait.  He is being referred to skilled physical therapy.  Evaluation demonstrates decreased strength and decreased balance.  He will benefit from skilled physcial therapy to maximize his functional improvement and decrease his risk of falling.     Rehab Potential Good   PT Frequency 2x / week   PT Duration 4 weeks   PT Treatment/Interventions ADLs/Self Care Home Management;Gait training;Stair training;Functional mobility training;Therapeutic activities;Therapeutic exercise;Balance training;Neuromuscular re-education;Patient/family education   PT Next Visit Plan begin rocker board, side stepping with t-band, tandem stance, sit to stand, heel and toe raises.  Progress to dynamic balance actiivty as well as yoga poses.    Consulted and Agree with Plan of Care Patient      Patient will benefit from skilled therapeutic intervention in order to improve the following deficits and impairments:  Abnormal gait, Decreased balance, Decreased strength  Visit Diagnosis: Unsteadiness on feet - Plan: PT plan of care cert/re-cert  Muscle weakness (generalized) - Plan: PT plan of care cert/re-cert  Other abnormalities of gait and mobility - Plan: PT plan of care cert/re-cert     Problem List Patient Active Problem List   Diagnosis Date Noted  . ETOH abuse   . Alcoholic peripheral neuropathy (HCC)   . Non-traumatic rhabdomyolysis   . AKI (acute kidney injury) (HCC)   . Benign essential HTN   . Fall   . Community acquired pneumonia   . Hypokalemia   . Hypomagnesemia   . Hypoalbuminemia due to protein-calorie malnutrition (HCC)   . Transaminitis   . Acute blood loss anemia   . Acute respiratory failure with hypoxia (HCC)   . Altered mental status 04/12/2016  . Alcohol withdrawal seizure (HCC) 04/12/2016  . Rhabdomyolysis 04/12/2016  . Hypertension 04/12/2016  . Dyslipidemia 04/12/2016  . Chronic  kidney disease 04/12/2016  . Elevated liver enzymes 04/12/2016  . Depression 04/12/2016  . Anxiety 04/12/2016  . Neuropathy, alcoholic (HCC) 02/22/2016  . History of adenomatous polyp of colon 03/10/2015  . Alcohol abuse, continuous 03/07/2014    Virgina Organ, PT CLT [phone] 07/09/2016, 3:22 PM  [facility] [address] [place], [place], [address] Phone: [phone]   Fax:  [phone]  Name: Tony Patterson MRN: [id number] Date of Birth: 12/09/1955

## 2016-07-12 ENCOUNTER — Ambulatory Visit (HOSPITAL_COMMUNITY): Payer: Managed Care, Other (non HMO) | Admitting: Physical Therapy

## 2016-07-12 DIAGNOSIS — R209 Unspecified disturbances of skin sensation: Secondary | ICD-10-CM

## 2016-07-12 DIAGNOSIS — R2681 Unsteadiness on feet: Secondary | ICD-10-CM

## 2016-07-12 DIAGNOSIS — M6281 Muscle weakness (generalized): Secondary | ICD-10-CM

## 2016-07-12 DIAGNOSIS — R2689 Other abnormalities of gait and mobility: Secondary | ICD-10-CM

## 2016-07-12 NOTE — Therapy (Addendum)
[place], [place], [address] Phone: [phone]   Fax:  [phone]  Physical Therapy Treatment/Discharge  Patient Details  Name: Tony Patterson MRN: [id number] Date of Birth: 1955/08/24 Referring Provider: Sarina Ill  Encounter Date: 07/12/2016      PT End of Session - 07/12/16 1515    Visit Number 2   Number of Visits 8   Date for PT Re-Evaluation 08/08/16   Authorization Type Aetna    Authorization - Visit Number 2   Authorization - Number of Visits 8   PT Start Time 9295   PT Stop Time 7473   PT Time Calculation (min) 41 min   Equipment Utilized During Treatment Gait belt   Activity Tolerance Patient tolerated treatment well;No increased pain   Behavior During Therapy WFL for tasks assessed/performed      Past Medical History:  Diagnosis Date  . Alcohol abuse   . Anemia   . Anxiety   . Hypertension   . Neuropathy ([place])   . Seizures ([place])    unknown etiology and on no meds; been 4 years since seizure.    Past Surgical History:  Procedure Laterality Date  . COLONOSCOPY  05/18/2007   [id number] hemorrhoids, a single anal papilla, otherwise normal/ Single polyp, as described above, removed   . COLONOSCOPY WITH PROPOFOL N/A 03/16/2015   Procedure: COLONOSCOPY WITH PROPOFOL;  Surgeon: Daneil Dolin, MD;  Location: [facility];  Service: Endoscopy;  Laterality: N/A;  in cecum at 1344. withdrawal time  19mn  . THROAT SURGERY     polyps removed x2    There were no vitals filed for this visit.      Subjective Assessment - 07/12/16 1433    Subjective Pt reports things are going well. He rode his bike today for about 15 minutes. No new issues to report, but he has been performing his HEP.    Pertinent History alcoholic peripheral neuropathy, B LBP with sciatica; seizures,HTN, fx Rt ankle 2017    How long can you sit comfortably? no problem   How long can you stand comfortably? no problem limited  more from his back than his feet.    How long can you walk comfortably? uses a walking stick able to walk for a mile    Patient Stated Goals to decrease numbness and improve mobity    Currently in Pain? No/denies                         [facility] - 07/12/16 0001      Knee/Hip Exercises: Seated   Other Seated Knee/Hip Exercises ankle PF with blue TB x20 reps      Knee/Hip Exercises: Supine   Straight Leg Raises Both;1 set;10 reps   Straight Leg Raises Limitations cues for technique      Knee/Hip Exercises: Sidelying   Hip ABduction Both;1 set;10 reps   Hip ABduction Limitations against wall      Knee/Hip Exercises: Prone   Straight Leg Raises Both;1 set;10 reps             Balance Exercises - 07/12/16 1505      Balance Exercises: Standing   Tandem Stance Eyes open;2 reps;20 secs;Other reps (comment)  addition of head turns Lt/Rt with each LE forward    SLS Eyes open;2 reps;20 secs;Other reps (comment)  LE propped on 12" box    Rockerboard Anterior/posterior;Lateral;EO;Other (comment)  1 UE  support x2 min each            PT Education - 07/12/16 1515    Education provided Yes   Education Details importance of full HEP adherence as this addresses specific areas of weakness.    Person(s) Educated Patient   Methods Explanation   Comprehension Verbalized understanding          PT Short Term Goals - 07/09/16 1516      PT SHORT TERM GOAL #1   Title Pt single leg stance to be 15 seconds on both legs to decrease risk of falling    Time 2   Period Weeks   Status New     PT SHORT TERM GOAL #2   Title Pt to be able to complete  5 sit to stand in 11 seconds to demonstrate improved power to balance to allow getting up from low couches with ease.    Time 2   Period Weeks   Status New           PT Long Term Goals - 07/09/16 1518      PT LONG TERM GOAL #1   Title Pt to be able to complete single leg stance on both legs  for up to 30 seconds to reduce risk of falling    Time 4   Period Weeks   Status New     PT LONG TERM GOAL #2   Title Pt muscular strength in LE to be at least 4+/5 to allow pt to be able to ascend and descend one flight of steps without difficulty.    Time 4   Period Weeks   Status New     PT LONG TERM GOAL #3   Title Pt to be walking for 1.5 miles at least three days a week to return to prior functional activities.    Time 4   Period Weeks   Status New               Plan - 07/12/16 1516    Clinical Impression Statement Pt arrived today reporting having performed some of his HEP. This was reviewed with him, and he demonstrated poor technique, needing increased cues for set up and performance. Remainder of the session focused on static balance activity. Pt did require intermittent UE support with several of the activities to perform without LOB. Ended session encouraging pt to increase HEP adherence and discussed the importance of this in improving his balance and progressing towards goals. Pt will continue to need reinforcement of this principal.    Rehab Potential Good   PT Frequency 2x / week   PT Duration 4 weeks   PT Treatment/Interventions ADLs/Self Care Home Management;Gait training;Stair training;Functional mobility training;Therapeutic activities;Therapeutic exercise;Balance training;Neuromuscular re-education;Patient/family education   PT Next Visit Plan LE strengthening: side stepping with T band if able to do with proper technique; ankle PF strength with TB, static balance activity    PT Home Exercise Plan no updated from initial eval; pt needs to be encouraged to adhere to his HEP and adjusted if positioning is an issue    Consulted and Agree with Plan of Care Patient      Patient will benefit from skilled therapeutic intervention in order to improve the following deficits and impairments:  Abnormal gait, Decreased balance, Decreased strength  Visit  Diagnosis: Unsteadiness on feet  Muscle weakness (generalized)  Unspecified disturbances of skin sensation  Other abnormalities of gait and mobility     Problem List Patient  Active Problem List   Diagnosis Date Noted  . ETOH abuse   . Alcoholic peripheral neuropathy ([place])   . Non-traumatic rhabdomyolysis   . AKI (acute kidney injury) ([place])   . Benign essential HTN   . Fall   . Community acquired pneumonia   . Hypokalemia   . Hypomagnesemia   . Hypoalbuminemia due to protein-calorie malnutrition ([place])   . Transaminitis   . Acute blood loss anemia   . Acute respiratory failure with hypoxia ([place])   . Altered mental status 04/12/2016  . Alcohol withdrawal seizure ([place]) 04/12/2016  . Rhabdomyolysis 04/12/2016  . Hypertension 04/12/2016  . Dyslipidemia 04/12/2016  . Chronic kidney disease 04/12/2016  . Elevated liver enzymes 04/12/2016  . Depression 04/12/2016  . Anxiety 04/12/2016  . Neuropathy, alcoholic ([place]) 29/79/8921  . History of adenomatous polyp of colon 03/10/2015  . Alcohol abuse, continuous 03/07/2014     3:23 PM,07/12/16 Elly Modena PT, DPT [facility] [phone]   [place] [address] [place], [place], [address] Phone: [phone]   Fax:  [phone]  Name: Tony Patterson MRN: [id number] Date of Birth: 23-Mar-1956  *Addendum to resolve episode of care and d/c from [place]  Visits from Start of Care: 2  Current functional level related to goals / functional outcomes: See above for more details    Remaining deficits: See above for more details    Education / Equipment: See above for more details  Plan: Patient agrees to discharge.  Patient goals were not met. Patient is being discharged due to the patient's request.  ?????     Pt called on 07/16/16 stating transportation was an issue and requested remaining appointments be  cancelled.   9:39 AM,12/26/16 [facility] [phone]

## 2016-07-15 ENCOUNTER — Telehealth (HOSPITAL_COMMUNITY): Payer: Self-pay | Admitting: Physical Therapy

## 2016-07-15 NOTE — Telephone Encounter (Signed)
Pt doesn't have a license and needs to work out transportation

## 2016-07-16 ENCOUNTER — Ambulatory Visit (HOSPITAL_COMMUNITY): Payer: Managed Care, Other (non HMO) | Admitting: Physical Therapy

## 2016-07-16 ENCOUNTER — Telehealth (HOSPITAL_COMMUNITY): Payer: Self-pay | Admitting: Physical Therapy

## 2016-07-16 NOTE — Telephone Encounter (Signed)
Patient did not show for appointment.  Called and spoke to patient who requested to cancel all February appointments and resume in March as he will have transportation beginning in march.  Removed all February appointments per request.  Lurena NidaAmy B Frazier, PTA/CLT [phone]

## 2016-07-18 ENCOUNTER — Ambulatory Visit (HOSPITAL_COMMUNITY): Payer: Managed Care, Other (non HMO) | Admitting: Physical Therapy

## 2016-07-23 ENCOUNTER — Ambulatory Visit (HOSPITAL_COMMUNITY): Payer: Managed Care, Other (non HMO) | Admitting: Physical Therapy

## 2016-07-25 ENCOUNTER — Ambulatory Visit (HOSPITAL_COMMUNITY): Payer: Managed Care, Other (non HMO) | Admitting: Physical Therapy

## 2016-07-30 ENCOUNTER — Ambulatory Visit (HOSPITAL_COMMUNITY): Payer: Managed Care, Other (non HMO) | Admitting: Physical Therapy

## 2016-08-01 ENCOUNTER — Ambulatory Visit (HOSPITAL_COMMUNITY): Payer: Managed Care, Other (non HMO) | Admitting: Physical Therapy

## 2016-08-21 ENCOUNTER — Ambulatory Visit: Payer: Managed Care, Other (non HMO) | Admitting: Neurology

## 2017-01-02 ENCOUNTER — Ambulatory Visit (INDEPENDENT_AMBULATORY_CARE_PROVIDER_SITE_OTHER): Payer: 59 | Admitting: Neurology

## 2017-01-02 ENCOUNTER — Encounter: Payer: Self-pay | Admitting: Neurology

## 2017-01-02 VITALS — BP 119/74 | HR 74 | Ht 75.0 in | Wt 238.2 lb

## 2017-01-02 DIAGNOSIS — M5116 Intervertebral disc disorders with radiculopathy, lumbar region: Secondary | ICD-10-CM

## 2017-01-02 DIAGNOSIS — R29898 Other symptoms and signs involving the musculoskeletal system: Secondary | ICD-10-CM

## 2017-01-02 DIAGNOSIS — R400 Somnolence: Secondary | ICD-10-CM | POA: Diagnosis not present

## 2017-01-02 DIAGNOSIS — R0681 Apnea, not elsewhere classified: Secondary | ICD-10-CM

## 2017-01-02 DIAGNOSIS — R0683 Snoring: Secondary | ICD-10-CM

## 2017-01-02 DIAGNOSIS — W19XXXA Unspecified fall, initial encounter: Secondary | ICD-10-CM

## 2017-01-02 MED ORDER — LIDOCAINE 5 % EX PTCH: 2.0000 | MEDICATED_PATCH | CUTANEOUS | 3 refills | Status: DC

## 2017-01-02 NOTE — Patient Instructions (Addendum)
Remember to drink plenty of fluid, eat healthy meals and do not skip any meals. Try to eat protein with a every meal and eat a healthy snack such as fruit or nuts in between meals. Try to keep a regular sleep-wake schedule and try to exercise daily, particularly in the form of walking, 20-30 minutes a day, if you can.   As far as your medications are concerned, I would like to suggest: alpha-lipoic acid 400-600mg   I would like to see you back in 9 months, sooner if we need to. Please call us with any interim questions, concerns, problems, updates or refill requests.   Our phone number is [phone]. We also have an after hours call service for urgent matters and there is a physician on-call for urgent questions. For any emergencies you know to call 911 or go to the nearest emergency room   Sciatica Sciatica is pain, numbness, weakness, or tingling along the path of the sciatic nerve. The sciatic nerve starts in the lower back and runs down the back of each leg. The nerve controls the muscles in the lower leg and in the back of the knee. It also provides feeling (sensation) to the back of the thigh, the lower leg, and the sole of the foot. Sciatica is a symptom of another medical condition that pinches or puts pressure on the sciatic nerve. Generally, sciatica only affects one side of the body. Sciatica usually goes away on its own or with treatment. In some cases, sciatica may keep coming back (recur). What are the causes? This condition is caused by pressure on the sciatic nerve, or pinching of the sciatic nerve. This may be the result of:  A disk in between the bones of the spine (vertebrae) bulging out too far (herniated disk).  Age-related changes in the spinal disks (degenerative disk disease).  A pain disorder that affects a muscle in the buttock (piriformis syndrome).  Extra bone growth (bone spur) near the sciatic nerve.  An injury or break (fracture) of the  pelvis.  Pregnancy.  Tumor (rare).  What increases the risk? The following factors may make you more likely to develop this condition:  Playing sports that place pressure or stress on the spine, such as football or weight lifting.  Having poor strength and flexibility.  A history of back injury.  A history of back surgery.  Sitting for long periods of time.  Doing activities that involve repetitive bending or lifting.  Obesity.  What are the signs or symptoms? Symptoms can vary from mild to very severe, and they may include:  Any of these problems in the lower back, leg, hip, or buttock: ? Mild tingling or dull aches. ? Burning sensations. ? Sharp pains.  Numbness in the back of the calf or the sole of the foot.  Leg weakness.  Severe back pain that makes movement difficult.  These symptoms may get worse when you cough, sneeze, or laugh, or when you sit or stand for long periods of time. Being overweight may also make symptoms worse. In some cases, symptoms may recur over time. How is this diagnosed? This condition may be diagnosed based on:  Your symptoms.  A physical exam. Your health care provider may ask you to do certain movements to check whether those movements trigger your symptoms.  You may have tests, including: ? Blood tests. ? X-rays. ? MRI. ? CT scan.  How is this treated? In many cases, this condition improves on its own, without any  treatment. However, treatment may include:  Reducing or modifying physical activity during periods of pain.  Exercising and stretching to strengthen your abdomen and improve the flexibility of your spine.  Icing and applying heat to the affected area.  Medicines that help: ? To relieve pain and swelling. ? To relax your muscles.  Injections of medicines that help to relieve pain, irritation, and inflammation around the sciatic nerve (steroids).  Surgery.  Follow these instructions at  home: Medicines  Take over-the-counter and prescription medicines only as told by your health care provider.  Do not drive or operate heavy machinery while taking prescription pain medicine. Managing pain  If directed, apply ice to the affected area. ? Put ice in a plastic bag. ? Place a towel between your skin and the bag. ? Leave the ice on for 20 minutes, 2-3 times a day.  After icing, apply heat to the affected area before you exercise or as often as told by your health care provider. Use the heat source that your health care provider recommends, such as a moist heat pack or a heating pad. ? Place a towel between your skin and the heat source. ? Leave the heat on for 20-30 minutes. ? Remove the heat if your skin turns bright red. This is especially important if you are unable to feel pain, heat, or cold. You may have a greater risk of getting burned. Activity  Return to your normal activities as told by your health care provider. Ask your health care provider what activities are safe for you. ? Avoid activities that make your symptoms worse.  Take brief periods of rest throughout the day. Resting in a lying or standing position is usually better than sitting to rest. ? When you rest for longer periods, mix in some mild activity or stretching between periods of rest. This will help to prevent stiffness and pain. ? Avoid sitting for long periods of time without moving. Get up and move around at least one time each hour.  Exercise and stretch regularly, as told by your health care provider.  Do not lift anything that is heavier than 10 lb (4.5 kg) while you have symptoms of sciatica. When you do not have symptoms, you should still avoid heavy lifting, especially repetitive heavy lifting.  When you lift objects, always use proper lifting technique, which includes: ? Bending your knees. ? Keeping the load close to your body. ? Avoiding twisting. General instructions  Use good  posture. ? Avoid leaning forward while sitting. ? Avoid hunching over while standing.  Maintain a healthy weight. Excess weight puts extra stress on your back and makes it difficult to maintain good posture.  Wear supportive, comfortable shoes. Avoid wearing high heels.  Avoid sleeping on a mattress that is too soft or too hard. A mattress that is firm enough to support your back when you sleep may help to reduce your pain.  Keep all follow-up visits as told by your health care provider. This is important. Contact a health care provider if:  You have pain that wakes you up when you are sleeping.  You have pain that gets worse when you lie down.  Your pain is worse than you have experienced in the past.  Your pain lasts longer than 4 weeks.  You experience unexplained weight loss. Get help right away if:  You lose control of your bowel or bladder (incontinence).  You have: ? Weakness in your lower back, pelvis, buttocks, or legs that gets  worse. ? Redness or swelling of your back. ? A burning sensation when you urinate. This information is not intended to replace advice given to you by your health care provider. Make sure you discuss any questions you have with your health care provider. Document Released: 05/14/2001 Document Revised: 10/24/2015 Document Reviewed: 01/27/2015 Elsevier Interactive Patient Education  2017 [employer].

## 2017-01-02 NOTE — Progress Notes (Signed)
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 Short term memory is fine, more long-term memory he can't remember a lot from a year ago. He exercises daily for an hour. Feet are more numb but also painful they hurt in the afternoon, mornings are pretty good, worse with walking. He is taking multivitamins, folate, a b-complex vitamin. Gabapentin helps. No seizures. He is not interested in PT.No falls. He has a lot of back pain, weakness in the legs, shooting pain from his back to his feet. Worse with sitting too long. Has to use his hands to stand up. Back hurts every day, aching, tight.   Interval history 07/03/2016: Patient here for followup of alcholic neuropathy. Recently hospitalized for alcohol withdrawal seizures and discharged to [facility] 04/18/2016. 85 days sober. Went to inpatient therapy. He is going to AA. He has a great sponsor who has been sober 6 years. Brother is here and says things are wonderful. Feels his memory is better. He is having pain in the big toes, throbbin like they want to rot off the rest of the feet are numb. Legs throb at night worse at night. During the day he is fine. He is taking the gabapentin 3x a day 600mg . Increase gabapentin to 1200 a night. Also PT.  Interval history 02/22/2016: Knee has been hurting for 8 weeks with swelling. A cortisone shot did not help. Xrays showed some arthritis. Patient says he fell and the right knee bent all the way back. Since then swelling and pain. He broke his ankle 7 weeks ago alsoafter a fall he tripped over the threhold and also injured his knee. He decided to retire. He stopped drinking. He was given lorazepam for 3 days. Knee brace is helping. His feet are mostly numb. He does have some pain on the bottom of his feet. His big toes hurt a lot. The right leg  is swollen below the knee and he has pain in his calf. [place] 15 hours straight without ambulating. Will refer to physical therapy for gait abnormality, u/s to eval for DVT and referral to [facility].  Interval history 08/24/2015: His feet are mostly numb, they don't really hurt. He has checked circulation. His gralise is at 1200mg . He increased about 2 months ago. The Gralise is stil working for him. He sleeps well. No significant pain in the feet. He continuous alcohol. He drinks bourbon and water at night. He goes through a half gallon of bourbon a week as well as beer. Discussed alcohol as a nerve toxin, patient acknowledges and understands the sequelae of alcoholism as far as his neuropathy is concerned.  HPI: Tony Patterson is a 61 y.o. male here as a referral from Dr. [name] for peripheral neuropathy. PMHx of long-standing continous alcohol abuse.   Feet have been numb for a year. Progressing. Also tingling. Not burning. Mainly in the toes and balls of feet. Worse when working or on feet. Has LBP and radicular symptoms. And cramping in the toes at night. Balance is worsening. Hasn't taken any medication to try and help with the symptoms. Getting off his feet makes the pain better. Severe and symptoms can get up to 8/10 in pain. No Fhx of neuromuscular disorders or CMT or neuropathy. Denies ever taking medications for chemotherapy,  no medications for gout, no isoniazid, flagyl or any new medications at onset. No inciting factors. He has 3 drinks every weekday of bourbon and a pint daily on the weekends. He is falling, tripping. He rolls his ankles. Had a seizure one year ago and was [facility], may have been alcohol related. No episodes for a year since [facility] to [facility] per patient.   Reviewed notes, labs and imaging from outside physicians, which showed: PMHx of CKD, Depression, HLD, HTN and alcohol abuse with continuous drinking  behavior. Dr. Ouida SillsFagan notes continuous alcholic consumption with elevated liver enzymes, drinks bourbon, HTN and HLD well controlled on metoprolol and statin. Last CBC and CMP significant for ALT 58 and AST 148 with a GFR of 81. Personally reviewed CT of the head from 11/2011, no large ischemic events, masses or tumors some questionably increased atrophy at the cerebellar vermis more than stated age.   EMG/NCS 03/2014: There is electrophysiologic evidence of a symmetric length-dependent severe axonal sensorimotor polyneuropathy.   Labs: TSH, HIV, ANA, IFE, RPR, heavy metals, B12, MMA, B1, all unremarkable in 2015    Social History   Social History  . Marital status: Divorced    Spouse name: N/A  . Number of children: 0  . Years of education: college 1   Occupational History  . [employer]   Social History Main Topics  . Smoking status: Former Smoker    Packs/day: 0.50    Years: 20.00    Types: Cigarettes    Quit date: 06/03/1995  . Smokeless tobacco: Never Used  . Alcohol use No     Comment: Previous alcohol abuse  . Drug use: No  . Sexual activity: No   Other Topics Concern  . Not on file   Social History Narrative   Left-handed   Caffeine use: Coke (caffeine free) ocass    Family History  Problem Relation Age of Onset  . Fibromyalgia Mother   . Dementia Mother   . Congestive Heart Failure Mother   . Congestive Heart Failure Father   . Heart Problems Father   . Hypertension Brother   . Hypertension Brother   . Neuropathy Neg Hx   . Colon cancer Neg Hx     Past Medical History:  Diagnosis Date  . Alcohol abuse   . Anemia   . Anxiety   . Hypertension   . Neuropathy   . Seizures (HCC)    unknown etiology and on no meds; been 4 years since seizure.    Past Surgical History:  Procedure Laterality Date  . COLONOSCOPY  05/18/2007   WUJ:[id number]:Internal hemorrhoids, a single anal papilla, otherwise normal/ Single polyp, as  described above, removed   . COLONOSCOPY WITH PROPOFOL N/A 03/16/2015   Procedure: COLONOSCOPY WITH PROPOFOL;  Surgeon: Corbin Adeobert M Rourk, MD;  Location: [facility];  Service: Endoscopy;  Laterality: N/A;  in cecum at 1344. withdrawal time  8min  . THROAT SURGERY     polyps removed x2    Current Outpatient Prescriptions  Medication Sig Dispense Refill  . atorvastatin (LIPITOR) 40 MG tablet Take 40 mg by mouth at bedtime.    Marland Kitchen. b complex vitamins capsule Take 1 capsule by mouth daily.    Marland Kitchen. BABY ASPIRIN PO Take by mouth daily.    . folic acid (FOLVITE) 1 MG tablet Take 1 tablet (1 mg total) by mouth daily.    Marland Kitchen. gabapentin (NEURONTIN) 600 MG tablet Take 1  tablet (600 mg) by mouth twice daily and 2 tablets (1200 mg) at bedtime 360 tablet 6  . magnesium oxide (MAG-OX) 400 MG tablet Take 400 mg by mouth at bedtime.    . metoprolol (LOPRESSOR) 50 MG tablet Take 50 mg by mouth 2 (two) times daily.     . Multiple Vitamin (MULTIVITAMIN WITH MINERALS) TABS tablet Take 1 tablet by mouth daily.    . Naphazoline HCl (CLEAR EYES OP) Place 1 drop into both eyes daily.    Marland Kitchen. omeprazole (PRILOSEC) 10 MG capsule Take 10 mg by mouth daily.    . tamsulosin (FLOMAX) 0.4 MG CAPS capsule      No current facility-administered medications for this visit.     Allergies as of 01/02/2017 - Review Complete 01/02/2017  Allergen Reaction Noted  . Bee venom Anaphylaxis 10/08/2011  . Penicillins Other (See Comments) 10/08/2011    Vitals: BP 119/74   Pulse 74   Ht 6\' 3"  (1.905 m)   Wt 238 lb 3.2 oz (108 kg)   BMI 29.77 kg/m  Last Weight:  Wt Readings from Last 1 Encounters:  01/02/17 238 lb 3.2 oz (108 kg)   Last Height:   Ht Readings from Last 1 Encounters:  01/02/17 6\' 3"  (1.905 m)      Physical exam: Exam: Gen: NAD, conversant, well nourised, overweigh  Eyes: Conjunctivae clear without exudates or hemorrhage  Neuro: Detailed Neurologic Exam  Speech:  Speech is normal; fluent  and spontaneous with normal comprehension.  Cognition:  The patient is oriented to person, place, and time;   Cranial Nerves:  The pupils are equal, round, and reactive to light. Extraocular movements are intact. Trigeminal sensation is intact and the muscles of mastication are normal. The face is symmetric. The palate elevates in the midline. Voice is normal. Shoulder shrug is normal. The tongue has normal motion without fasciculations.   Gait:  Wide based, ataxic, imbalance with tandem  Motor Observation:  Postural tremor. Atrophy of the distal legs, tapering Tone:  Normal muscle tone.   Posture:  Posture is normal. normal erect   Strength: right hip flexion 3+/5, left 4/%, right biceps femoris 4/5 , right DF 5-/5.    Sensation:   Decreased cold and pp to below knees and below elbows Absent vibaration at great toes, 8 seconds at medial malleoli proprioception intact  DTRs: Absent AJs   Assessment/Plan: [age]year old male with a PMHx of long-standing continous alcohol abuse(recently stopped 9 months sober)who is here for follow up of alcoholic peripheral neuropathy. Exam significant for absent acilles DTRs, atrophy of the distal legs and tapering, decreased cold and pp to below knees and below wrists, absent vibaration at great toes. 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 EMG/NCS with severe axonal polyneuropathy.   - New problem chronic low back pain with right leg weakness on exam: Patient has been to physical therapy and tried conservative measures such as heating pad and analgesics and NSAIDs without relief. He has right leg weakness. Recommend MRI of the lumbar spine for evaluation of epidural steroid injections or surgical intervention. - Continue gabapentin - Memory problems have improved since he stopped alcohol - Patient snores and his nephew who is present today reports gasping for air and apneic events while he is sleeping. He is very tired, ESS is 15.  Sleep evaluation. - Lidoderm patches for low back.  Orders Placed This Encounter  Procedures  . MR LUMBAR SPINE WO CONTRAST  . Ambulatory referral to Sleep Studies     Desma MaximAntonia  Lucia Gaskins, MD  [facility] [address] [place], [place] [address]  Phone [phone] Fax [phone]  A total of 25 minutes was spent face-to-face with this patient. Over half this time was spent on counseling patient on the chronic low back pain and lumbar radiculopathy, leg weakness, alcoholic neuropathy, snoring and witnessed apneic events,diagnosis and different diagnostic and therapeutic options available.

## 2017-01-16 ENCOUNTER — Telehealth: Payer: Self-pay

## 2017-01-16 NOTE — Telephone Encounter (Signed)
Lidocaine PA (Key: [id number]) initiated via CoverMyMeds.

## 2017-01-18 ENCOUNTER — Ambulatory Visit
Admission: RE | Admit: 2017-01-18 | Discharge: 2017-01-18 | Disposition: A | Discharge status: Home / Self Care | Payer: Managed Care, Other (non HMO) | Source: Ambulatory Visit | Attending: Neurology | Admitting: Neurology

## 2017-01-18 DIAGNOSIS — R29898 Other symptoms and signs involving the musculoskeletal system: Secondary | ICD-10-CM

## 2017-01-18 DIAGNOSIS — W19XXXA Unspecified fall, initial encounter: Secondary | ICD-10-CM

## 2017-01-18 DIAGNOSIS — M5116 Intervertebral disc disorders with radiculopathy, lumbar region: Secondary | ICD-10-CM

## 2017-01-22 ENCOUNTER — Other Ambulatory Visit: Payer: Self-pay | Admitting: *Deleted

## 2017-01-22 ENCOUNTER — Telehealth: Payer: Self-pay | Admitting: *Deleted

## 2017-01-22 DIAGNOSIS — M4854XA Collapsed vertebra, not elsewhere classified, thoracic region, initial encounter for fracture: Secondary | ICD-10-CM

## 2017-01-22 DIAGNOSIS — M5416 Radiculopathy, lumbar region: Secondary | ICD-10-CM

## 2017-01-22 NOTE — Telephone Encounter (Signed)
-----   Message from Anson Fret, MD sent at 01/22/2017  4:24 AM EDT ----- Marcelino Duster, you are work-in nurse would you mind allin gpatient back? He has a compression fracture that appears to be new and also it looks like L5 right nerve compression that corresponds with his right leg symptoms. He should be evaluated at neurosurgery for surgical or other options. woul dyou discuss results with patient and also ask if he would like a referral to [facility]? If so, please refer him thank you !  This is an abnormal MRI of the lumbar spine showed the following: 1.    There is a 90% anterior wedge compression fracture of the T12 vertebral body. There is mild edema noted on the STIR images consistent with a subacute to subchronic timeframe. 2.    At L3-L4, there is a right ischemic protrusion and mild facet hypertrophy causing moderate right lateral recess stenosis and moderate right foraminal narrowing. There is no definite nerve root compression though the degenerative changes encroach upon the right L3 and L4 nerve roots. 3.    At L5-S1, there is right lateral disc protrusion causing moderate right foraminal narrowing displacing the right L5 nerve root. 4.   There are milder degenerative changes at the other levels with less potential for nerve root compression

## 2017-01-22 NOTE — Telephone Encounter (Signed)
Called and spoke with patient. Advised we received denial for lidocaine patch.  D/t recent MRI findings of compression fracture, wanted to see how his pain was. Dr Terrace [facility]) willing to call in something for pain until he can get in to see neurosurgery.   Patient states he is doing okay. Pain only there in the morning and when he first gets up. If he sits in a chair for more than a hour, he starts to have pain. He declined anything for pain right now. He knows to call if pain worsens.

## 2017-01-22 NOTE — Telephone Encounter (Signed)
I have spoke with the patient.  He is aware of results and agreeable to be referred to [facility].  Orders placed in Epic and forwarded to [employer].

## 2017-01-22 NOTE — Telephone Encounter (Signed)
Left messages on both home and mobile number requesting a return call.

## 2017-02-04 ENCOUNTER — Institutional Professional Consult (permissible substitution): Payer: 59 | Admitting: Neurology

## 2017-02-06 ENCOUNTER — Telehealth: Payer: Self-pay | Admitting: Neurology

## 2017-02-06 NOTE — Telephone Encounter (Signed)
Noted  

## 2017-02-06 NOTE — Telephone Encounter (Signed)
Pt's nephew Perlie GoldRussell c/a appt on 02/04/17. He did not give a reason why. Pt was called and lvm on 01/22/17 and 02/06/17 to call back if he wished to reschedule sleep consult. Pt will be listed as patient refusal if he calls back I will be more than happy to get him rescheduled.

## 2017-02-19 NOTE — Telephone Encounter (Signed)
This was done in error.

## 2017-03-11 ENCOUNTER — Other Ambulatory Visit: Payer: Self-pay | Admitting: Neurological Surgery

## 2017-03-11 DIAGNOSIS — S22081A Stable burst fracture of T11-T12 vertebra, initial encounter for closed fracture: Secondary | ICD-10-CM

## 2017-03-20 ENCOUNTER — Encounter (HOSPITAL_COMMUNITY): Payer: Self-pay

## 2017-03-20 ENCOUNTER — Ambulatory Visit (HOSPITAL_COMMUNITY): Payer: Managed Care, Other (non HMO) | Attending: Neurological Surgery

## 2017-03-20 DIAGNOSIS — M546 Pain in thoracic spine: Secondary | ICD-10-CM | POA: Diagnosis present

## 2017-03-20 DIAGNOSIS — M6281 Muscle weakness (generalized): Secondary | ICD-10-CM | POA: Insufficient documentation

## 2017-03-20 DIAGNOSIS — R293 Abnormal posture: Secondary | ICD-10-CM

## 2017-03-20 NOTE — Therapy (Signed)
[facility] [address] [place], [place], [address] Phone: [phone]   Fax:  [phone]  Physical Therapy Evaluation  Patient Details  Name: Tony Patterson MRN: [id number] Date of Birth: 03-05-1956 Referring Provider: Cherrie Distance, MD  Encounter Date: 03/20/2017      PT End of Session - 03/20/17 1516    Visit Number 1   Number of Visits 9   Date for PT Re-Evaluation 04/17/17   Authorization Type [employer] Managed   Authorization Time Period 03/20/17 to 04/17/17   Authorization - Visit Number 1   Authorization - Number of Visits 60   PT Start Time 1433   PT Stop Time 1514   PT Time Calculation (min) 41 min   Activity Tolerance Patient tolerated treatment well   Behavior During Therapy [facility] for tasks assessed/performed      Past Medical History:  Diagnosis Date  . Alcohol abuse   . Anemia   . Anxiety   . Hypertension   . Neuropathy   . Seizures (HCC)    unknown etiology and on no meds; been 4 years since seizure.    Past Surgical History:  Procedure Laterality Date  . COLONOSCOPY  05/18/2007   LKG:[id number] hemorrhoids, a single anal papilla, otherwise normal/ Single polyp, as described above, removed   . COLONOSCOPY WITH PROPOFOL N/A 03/16/2015   Procedure: COLONOSCOPY WITH PROPOFOL;  Surgeon: Corbin Ade, MD;  Location: [facility];  Service: Endoscopy;  Laterality: N/A;  in cecum at 1344. withdrawal time   . THROAT SURGERY     polyps removed x2    There were no vitals filed for this visit.       Subjective Assessment - 03/20/17 1438    Subjective Pt states that he had x-rays a few weeks ago that showed he broke a bone in his lower back. His neurosurgeon said that it was starting to heal on its own so he does not need surgery or shots. He stated that the injury happened a little over a year ago when he tripped over a step due to his neuropathy. He states that his pain is located in his lower back region but it has  started to feel better over the last 2 months. He works out at [employer] 3x/week. He states that the pain limits him as far as lifting heavy stuff. He works 3 days/week and is lifting heavy, awkaward shaped items. He denies any n/t down his legs, just the neuropathy in his feet.    Limitations Lifting   How long can you sit comfortably? no issues   How long can you stand comfortably? about 30-40 mins and then has to lean up against something    How long can you walk comfortably? can do 2 miles on treadmill (about 45-50 mins)   Patient Stated Goals get 100%   Currently in Pain? Yes   Pain Score 2    Pain Location Back   Pain Orientation Lower;Mid   Pain Descriptors / Indicators Aching   Pain Type Chronic pain   Pain Onset More than a month ago   Pain Frequency Intermittent   Aggravating Factors  standing, lifting   Pain Relieving Factors unsure, nothing   Effect of Pain on Daily Activities increases            [facility] PT Assessment - 03/20/17 0001      Assessment   Medical Diagnosis T12 Burst fracture   Referring Provider Cherrie Distance, MD  Onset Date/Surgical Date --  over a year ago   Next MD Visit 04/03/17   Prior Therapy none for LBP, just for neuropathy     Balance Screen   Has the patient fallen in the past 6 months No   Has the patient had a decrease in activity level because of a fear of falling?  No   Is the patient reluctant to leave their home because of a fear of falling?  No     Prior Function   Level of Independence Independent     Observation/Other Assessments   Focus on Therapeutic Outcomes (FOTO)  31%     Posture/Postural Control   Posture/Postural Control Postural limitations   Postural Limitations Rounded Shoulders;Forward head;Increased thoracic kyphosis;Increased lumbar lordosis     ROM / Strength   AROM / PROM / Strength Strength     AROM   AROM Assessment Site Thoracic;Lumbar   Lumbar Flexion very minimal flexion noted   Lumbar Extension WNL    Lumbar - Right Side Bend mod limitation   Lumbar - Left Side Bend mod limitation   Lumbar - Right Rotation min limitation   Lumbar - Left Rotation min limitation   Thoracic Flexion no assessed as pt has max thoracic kyphosis   Thoracic Extension 75% limited   Thoracic - Right Side Bend 25% limited   Thoracic - Left Side Bend 25% limited   Thoracic - Right Rotation 25% limited   Thoracic - Left Rotation 25% limited     Strength   Right Hip Flexion 5/5   Right Hip Extension 4+/5   Right Hip ABduction 4/5   Left Hip Flexion 5/5   Left Hip Extension 4/5   Left Hip ABduction 4-/5   Right Knee Flexion 5/5   Right Knee Extension 5/5   Left Knee Flexion 5/5   Left Knee Extension 5/5   Right Ankle Dorsiflexion 5/5   Left Ankle Dorsiflexion 5/5     Flexibility   Soft Tissue Assessment /Muscle Length yes   Hamstrings 90/90: R:145 deg; L: 155 deg         Objective measurements completed on examination: See above findings.          PT Education - 03/20/17 1511    Education provided Yes   Education Details exam findings, HEP, POC   Person(s) Educated Patient   Methods Explanation;Demonstration;Handout   Comprehension Verbalized understanding          PT Short Term Goals - 03/20/17 1638      PT SHORT TERM GOAL #1   Title Pt will be independent with HEP and perform consistently in order to promote return to PLOF and decrease risk for reinjury.   Time 2   Period Weeks   Status New     PT SHORT TERM GOAL #2   Title Pt will be able to properly demonstrate setup and use of lumbar roll when sitting and maintain proper sitting posture at least 75% of the time in order to maximize posture and decrease pain.   Time 2   Period Weeks   Status New           PT Long Term Goals - 03/20/17 1640      PT LONG TERM GOAL #1   Title Pt will have improved lumbar flexion AROM to mod limitations and thoracic extension to mod limitations in order to decrease stress on lower  thoracic spine and decrease risk for reinjury   Time 4  Period Weeks   Status New   Target Date 04/17/17     PT LONG TERM GOAL #2   Title Pt will have improved BLE MMT to at least 4+/5 to decrease back pain and maximize function at work.   Time 4   Period Weeks   Status New     PT LONG TERM GOAL #3   Title Pt will be able to properly demonstrate lifting 30# floor to chest, with proper mechanics, and no reports of back pain in order to decrease risk of injury to his back and maximize function at work.   Time 4   Period Weeks   Status New                Plan - 03/20/17 1631    Clinical Impression Statement Pt is pleasant 61 YO M who presents to O[facility] with c/o back pain s/p T12 burst fracture 1 year ago due to a fall. Pt's pain has started to feel better over the last 2 months, but he is still having difficulty lifting heavy items, which is part of his job requirements 3x/week. Pt currently presents with deficits in posture, lumbar and thoracic AROM, BLE MMT, body mechanics with lifting, and increased soft tissue restrictions which are tender to palpation. Pt needs skilled PT intervention to address these deficits in order to maximize overall function and decrease risk for reinjury.   History and Personal Factors relevant to plan of care: h/o seizures (has not had any since last year), BLE neuropathy , HTN; motivated, active lifestyle   Clinical Presentation Stable   Clinical Presentation due to: posture, lumbar and thoracic AROM, BLE MMT, body mechanics with lifting, soft tissue restrictions   Clinical Decision Making Low   Rehab Potential Good   PT Frequency 2x / week   PT Duration 4 weeks   PT Treatment/Interventions ADLs/Self Care Home Management;Cryotherapy;Electrical Stimulation;Moist Heat;Gait training;Stair training;Functional mobility training;Therapeutic activities;Therapeutic exercise;Balance training;Neuromuscular re-education;Patient/family education;Manual  techniques;Passive range of motion;Dry needling;Energy conservation;Taping   PT Next Visit Plan review goals and HEP, assess SLS times; postural strengthening, functional BLE strengthening, 3D thoracic excursion (not flexion), SKTC/DKTC for improved lumbar flexion ROM, manual to musculature for pain and ROM; balance   PT Home Exercise Plan eval: sitting with lumbar roll, standing scap retraction and shoulder ext with GTB   Consulted and Agree with Plan of Care Patient      Patient will benefit from skilled therapeutic intervention in order to improve the following deficits and impairments:  Decreased knowledge of precautions, Decreased mobility, Decreased range of motion, Decreased strength, Hypomobility, Increased fascial restricitons, Increased muscle spasms, Impaired flexibility, Improper body mechanics, Postural dysfunction, Pain  Visit Diagnosis: Pain in thoracic spine - Plan: PT plan of care cert/re-cert  Abnormal posture - Plan: PT plan of care cert/re-cert  Muscle weakness (generalized) - Plan: PT plan of care cert/re-cert     Problem List Patient Active Problem List   Diagnosis Date Noted  . ETOH abuse   . Alcoholic peripheral neuropathy (HCC)   . Non-traumatic rhabdomyolysis   . AKI (acute kidney injury) (HCC)   . Benign essential HTN   . Fall   . Community acquired pneumonia   . Hypokalemia   . Hypomagnesemia   . Hypoalbuminemia due to protein-calorie malnutrition (HCC)   . Transaminitis   . Acute blood loss anemia   . Acute respiratory failure with hypoxia (HCC)   . Altered mental status 04/12/2016  . Alcohol withdrawal seizure (HCC) 04/12/2016  .  Rhabdomyolysis 04/12/2016  . Hypertension 04/12/2016  . Dyslipidemia 04/12/2016  . Chronic kidney disease 04/12/2016  . Elevated liver enzymes 04/12/2016  . Depression 04/12/2016  . Anxiety 04/12/2016  . Neuropathy, alcoholic (HCC) 02/22/2016  . History of adenomatous polyp of colon 03/10/2015  . Alcohol abuse,  continuous 03/07/2014       Jac Canavan PT, DPT  [facility] [address] [place], [place], [address] Phone: [phone]   Fax:  [phone]  Name: Tony Patterson MRN: [id number] Date of Birth: 04/11/1956

## 2017-03-20 NOTE — Patient Instructions (Signed)
  Scapular Retraction  Wrap an elastic band around a door knob or banister. Grab the ends of the band with both hands with your arms extended. With good posture, pull the band backwards and squeeze your shoulder blades together for 3 seconds. Make sure your elbows stay close to your body.    Perform 1x/day, 2-3 sets of 10-15 reps   ELASTIC BAND SCAPULAR RETRACTIONS WITH MINI SHOULDER EXTENSIONS  While holding an elastic band with both arms in front of you with your elbows straight, squeeze your shoulder blades together as you pull the band back. Be sure your shoulders do not raise up.    Perform 1x/day, 2-3 sets of 10-15 reps   Seated Posture with Lumbar Roll  Place the lumbar roll as shown in the picture on the left.  Slide your bottom to the back of the chair and sit up straight so the roll provides support in the natural curve of your lower back.  Keep your shoulders back and head in a neutral position.  Maintain this position at all time when sitting.

## 2017-03-26 ENCOUNTER — Ambulatory Visit (HOSPITAL_COMMUNITY): Payer: Managed Care, Other (non HMO) | Admitting: Physical Therapy

## 2017-03-26 DIAGNOSIS — R293 Abnormal posture: Secondary | ICD-10-CM

## 2017-03-26 DIAGNOSIS — M546 Pain in thoracic spine: Secondary | ICD-10-CM

## 2017-03-26 DIAGNOSIS — M6281 Muscle weakness (generalized): Secondary | ICD-10-CM

## 2017-03-26 NOTE — Therapy (Signed)
[facility] [address] [place], [place], [address] Phone: [phone]   Fax:  [phone]  Physical Therapy Treatment  Patient Details  Name: Tony PorterRobert Patterson MRN: [id number] Date of Birth: 1956-01-07 Referring Provider: Cherrie DistanceBenjamin Ditty, MD  Encounter Date: 03/26/2017      PT End of Session - 03/26/17 1319    Visit Number 2   Number of Visits 9   Date for PT Re-Evaluation 04/17/17   Authorization Type [employer] Managed   Authorization Time Period 03/20/17 to 04/17/17   Authorization - Visit Number 2   Authorization - Number of Visits 60   PT Start Time 1304   PT Stop Time 1345   PT Time Calculation (min) 41 min   Activity Tolerance Patient tolerated treatment well   Behavior [facility] Therapy [facility] for tasks assessed/performed      Past Medical History:  Diagnosis Date  . Alcohol abuse   . Anemia   . Anxiety   . Hypertension   . Neuropathy   . Seizures (HCC)    unknown etiology and on no meds; been 4 years since seizure.    Past Surgical History:  Procedure Laterality Date  . COLONOSCOPY  05/18/2007   GLO:[id number]:Internal hemorrhoids, a single anal papilla, otherwise normal/ Single polyp, as described above, removed   . COLONOSCOPY WITH PROPOFOL N/A 03/16/2015   Procedure: COLONOSCOPY WITH PROPOFOL;  Surgeon: Corbin Adeobert M Rourk, MD;  Location: [facility];  Service: Endoscopy;  Laterality: N/A;  in cecum at 1344. withdrawal time  8min  . THROAT SURGERY     polyps removed x2    There were no vitals filed for this visit.      Subjective Assessment - 03/26/17 1427    Subjective Pt states he's been doing his HEP.   Currently with some discomfort in his lower back at 2/10.   Currently in Pain? Yes   Pain Score 2    Pain Location Back   Pain Orientation Lower;Mid   Pain Descriptors / Indicators Aching                         [facility] Adult PT Treatment/Exercise - 03/26/17 0001      Lumbar Exercises: Stretches   Single Knee to  Chest Stretch 5 reps;20 seconds   Double Knee to Chest Stretch 5 reps;10 seconds   Lower Trunk Rotation 5 reps;10 seconds     Lumbar Exercises: Standing   Scapular Retraction 10 reps;Theraband   Theraband Level (Scapular Retraction) Level 3 (Green)   Shoulder Extension 10 reps;Theraband   Theraband Level (Shoulder Extension) Level 3 (Green)     Lumbar Exercises: Seated   Other Seated Lumbar Exercises thoracic excursions (no flexion) 5 reps each with UE movements     Lumbar Exercises: Supine   Bridge 10 reps   Other Supine Lumbar Exercises logroll technique                PT Education - 03/26/17 1415    Education provided Yes   Education Details evaluation goals and HEP.  Instructed with logroll for transferring supine to/from sit and postural education.   Person(s) Educated Patient   Methods Explanation;Demonstration;Tactile cues;Verbal cues;Handout   Comprehension Verbalized understanding;Returned demonstration;Verbal cues required;Tactile cues required;Need further instruction          PT Short Term Goals - 03/20/17 1638      PT SHORT TERM GOAL #1   Title Pt will be independent with HEP  and perform consistently in order to promote return to PLOF and decrease risk for reinjury.   Time 2   Period Weeks   Status New     PT SHORT TERM GOAL #2   Title Pt will be able to properly demonstrate setup and use of lumbar roll when sitting and maintain proper sitting posture at least 75% of the time in order to maximize posture and decrease pain.   Time 2   Period Weeks   Status New           PT Long Term Goals - 03/20/17 1640      PT LONG TERM GOAL #1   Title Pt will have improved lumbar flexion AROM to mod limitations and thoracic extension to mod limitations in order to decrease stress on lower thoracic spine and decrease risk for reinjury   Time 4   Period Weeks   Status New   Target Date 04/17/17     PT LONG TERM GOAL #2   Title Pt will have improved BLE  MMT to at least 4+/5 to decrease back pain and maximize function at work.   Time 4   Period Weeks   Status New     PT LONG TERM GOAL #3   Title Pt will be able to properly demonstrate lifting 30# floor to chest, with proper mechanics, and no reports of back pain in order to decrease risk of injury to his back and maximize function at work.   Time 4   Period Weeks   Status New               Plan - 03/26/17 1320    Clinical Impression Statement Reviewed HEP and goals per initial evaluation.  Progressed therex to include thoracic excursions (excluding flexion) with cues for form and posturing. Pt with difficulty maintaing SLS greater than 5 seconds.  Pt also requires constant cues to obtain and stay in good seated posture without forward flexion.  Pt instructed to continue HEP and become more aware of his posture.   Rehab Potential Good   PT Frequency 2x / week   PT Duration 4 weeks   PT Treatment/Interventions ADLs/Self Care Home Management;Cryotherapy;Electrical Stimulation;Moist Heat;Gait training;Stair training;Functional mobility training;Therapeutic activities;Therapeutic exercise;Balance training;Neuromuscular re-education;Patient/family education;Manual techniques;Passive range of motion;Dry needling;Energy conservation;Taping   PT Next Visit Plan Continue progression of postural strengthening and lumbar ROM.  complete manual to musculature for pain and ROM if needed.  Next session add UE flexion against wall, seated scapular retraction/WBack and 3D lumbar excursion (no flexion).     PT Home Exercise Plan eval: sitting with lumbar roll, standing scap retraction and shoulder ext with GTB   Consulted and Agree with Plan of Care Patient      Patient will benefit from skilled therapeutic intervention in order to improve the following deficits and impairments:  Decreased knowledge of precautions, Decreased mobility, Decreased range of motion, Decreased strength, Hypomobility,  Increased fascial restricitons, Increased muscle spasms, Impaired flexibility, Improper body mechanics, Postural dysfunction, Pain  Visit Diagnosis: Pain in thoracic spine  Abnormal posture  Muscle weakness (generalized)     Problem List Patient Active Problem List   Diagnosis Date Noted  . ETOH abuse   . Alcoholic peripheral neuropathy (HCC)   . Non-traumatic rhabdomyolysis   . AKI (acute kidney injury) (HCC)   . Benign essential HTN   . Fall   . Community acquired pneumonia   . Hypokalemia   . Hypomagnesemia   . Hypoalbuminemia due to  protein-calorie malnutrition (HCC)   . Transaminitis   . Acute blood loss anemia   . Acute respiratory failure with hypoxia (HCC)   . Altered mental status 04/12/2016  . Alcohol withdrawal seizure (HCC) 04/12/2016  . Rhabdomyolysis 04/12/2016  . Hypertension 04/12/2016  . Dyslipidemia 04/12/2016  . Chronic kidney disease 04/12/2016  . Elevated liver enzymes 04/12/2016  . Depression 04/12/2016  . Anxiety 04/12/2016  . Neuropathy, alcoholic (HCC) 02/22/2016  . History of adenomatous polyp of colon 03/10/2015  . Alcohol abuse, continuous 03/07/2014   Lurena Nida, PTA/CLT [phone]  Lurena Nida 03/26/2017, 2:28 PM  [facility] [address] [place], [place], [address] Phone: [phone]   Fax:  [phone]  Name: Tony Patterson MRN: [id number] Date of Birth: 09/01/1955

## 2017-03-28 ENCOUNTER — Encounter (HOSPITAL_COMMUNITY): Payer: Self-pay

## 2017-03-28 ENCOUNTER — Ambulatory Visit (HOSPITAL_COMMUNITY): Payer: Managed Care, Other (non HMO)

## 2017-03-28 DIAGNOSIS — R293 Abnormal posture: Secondary | ICD-10-CM

## 2017-03-28 DIAGNOSIS — M546 Pain in thoracic spine: Secondary | ICD-10-CM | POA: Diagnosis not present

## 2017-03-28 DIAGNOSIS — M6281 Muscle weakness (generalized): Secondary | ICD-10-CM

## 2017-03-28 NOTE — Therapy (Signed)
[facility] [address] [place], [place], [address] Phone: [phone]   Fax:  [phone]  Physical Therapy Treatment  Patient Details  Name: Tony Patterson MRN: [id number] Date of Birth: 1955-12-06 Referring Provider: Cherrie Distance, MD  Encounter Date: 03/28/2017      PT End of Session - 03/28/17 1401    Visit Number 3   Number of Visits 9   Date for PT Re-Evaluation 04/17/17   Authorization Type [employer] Managed   Authorization Time Period 03/20/17 to 04/17/17   Authorization - Visit Number 3   Authorization - Number of Visits 60   PT Start Time 1350   PT Stop Time 1428   PT Time Calculation (min) 38 min   Activity Tolerance Patient tolerated treatment well   Behavior During Therapy [facility] for tasks assessed/performed      Past Medical History:  Diagnosis Date  . Alcohol abuse   . Anemia   . Anxiety   . Hypertension   . Neuropathy   . Seizures (HCC)    unknown etiology and on no meds; been 4 years since seizure.    Past Surgical History:  Procedure Laterality Date  . COLONOSCOPY  05/18/2007   ONG:EXBMWUXL hemorrhoids, a single anal papilla, otherwise normal/ Single polyp, as described above, removed   . COLONOSCOPY WITH PROPOFOL N/A 03/16/2015   Procedure: COLONOSCOPY WITH PROPOFOL;  Surgeon: Corbin Ade, MD;  Location: [facility];  Service: Endoscopy;  Laterality: N/A;  in cecum at 1344. withdrawal time   . THROAT SURGERY     polyps removed x2    There were no vitals filed for this visit.      Subjective Assessment - 03/28/17 1355    Subjective Pt stated he has pain with sitting, increased from no pain standing to 8/10 in sitting posture.   Patient Stated Goals get 100%   Currently in Pain? No/denies  0/10 standing, 8/10 sitting   Pain Score 2   while sitting with lumbar roll   Pain Location Back   Pain Orientation Lower   Pain Descriptors / Indicators Aching   Pain Type Chronic pain   Pain Onset More  than a month ago   Pain Frequency Intermittent   Aggravating Factors  sitting, standing, lifting, supine flat   Pain Relieving Factors leaning against counter relief, recliner comfort   Effect of Pain on Daily Activities increases                         [facility] Adult PT Treatment/Exercise - 03/28/17 0001      Posture/Postural Control   Posture/Postural Control Postural limitations   Postural Limitations Rounded Shoulders;Forward head;Increased thoracic kyphosis;Increased lumbar lordosis     Lumbar Exercises: Standing   Functional Squats 10 reps   Functional Squats Limitations 3D hip excursion   Scapular Retraction 10 reps;Theraband   Theraband Level (Scapular Retraction) Level 3 (Green)   Scapular Retraction Limitations cueing for posture through session   Shoulder Extension 10 reps;Theraband   Theraband Level (Shoulder Extension) Level 3 (Green)   Other Standing Lumbar Exercises UE flexion against wall with cervical retraction 10     Lumbar Exercises: Seated   Other Seated Lumbar Exercises reviewed benefits with lumbar roll; thoracic excursions (no flexion) 5 reps each with UE movements;  cervical retraction 5x; scapular retraction 10x; wback 10x                  PT  Short Term Goals - 03/20/17 1638      PT SHORT TERM GOAL #1   Title Pt will be independent with HEP and perform consistently in order to promote return to PLOF and decrease risk for reinjury.   Time 2   Period Weeks   Status New     PT SHORT TERM GOAL #2   Title Pt will be able to properly demonstrate setup and use of lumbar roll when sitting and maintain proper sitting posture at least 75% of the time in order to maximize posture and decrease pain.   Time 2   Period Weeks   Status New           PT Long Term Goals - 03/20/17 1640      PT LONG TERM GOAL #1   Title Pt will have improved lumbar flexion AROM to mod limitations and thoracic extension to mod limitations in order to  decrease stress on lower thoracic spine and decrease risk for reinjury   Time 4   Period Weeks   Status New   Target Date 04/17/17     PT LONG TERM GOAL #2   Title Pt will have improved BLE MMT to at least 4+/5 to decrease back pain and maximize function at work.   Time 4   Period Weeks   Status New     PT LONG TERM GOAL #3   Title Pt will be able to properly demonstrate lifting 30# floor to chest, with proper mechanics, and no reports of back pain in order to decrease risk of injury to his back and maximize function at work.   Time 4   Period Weeks   Status New               Plan - 03/28/17 1402    Clinical Impression Statement Pt educated on importance of posture, required constant cueing through session to improve seated and standing posture and reduce forward flexion.  Reviewed benefits of lumbar roll to assist with seated posture.  Progressed lumbar mobility and posture strengthening with moderate cueing to reduce forward flexion.  No reports of pain through session.   Rehab Potential Good   PT Frequency 2x / week   PT Duration 4 weeks   PT Treatment/Interventions ADLs/Self Care Home Management;Cryotherapy;Electrical Stimulation;Moist Heat;Gait training;Stair training;Functional mobility training;Therapeutic activities;Therapeutic exercise;Balance training;Neuromuscular re-education;Patient/family education;Manual techniques;Passive range of motion;Dry needling;Energy conservation;Taping   PT Next Visit Plan Continue progression of postural strengthening and lumbar ROM.  complete manual to musculature for pain and ROM if needed.     PT Home Exercise Plan eval: sitting with lumbar roll, standing scap retraction and shoulder ext with GTB      Patient will benefit from skilled therapeutic intervention in order to improve the following deficits and impairments:  Decreased knowledge of precautions, Decreased mobility, Decreased range of motion, Decreased strength, Hypomobility,  Increased fascial restricitons, Increased muscle spasms, Impaired flexibility, Improper body mechanics, Postural dysfunction, Pain  Visit Diagnosis: Pain in thoracic spine  Abnormal posture  Muscle weakness (generalized)     Problem List Patient Active Problem List   Diagnosis Date Noted  . ETOH abuse   . Alcoholic peripheral neuropathy (HCC)   . Non-traumatic rhabdomyolysis   . AKI (acute kidney injury) (HCC)   . Benign essential HTN   . Fall   . Community acquired pneumonia   . Hypokalemia   . Hypomagnesemia   . Hypoalbuminemia due to protein-calorie malnutrition (HCC)   . Transaminitis   .  Acute blood loss anemia   . Acute respiratory failure with hypoxia (HCC)   . Altered mental status 04/12/2016  . Alcohol withdrawal seizure (HCC) 04/12/2016  . Rhabdomyolysis 04/12/2016  . Hypertension 04/12/2016  . Dyslipidemia 04/12/2016  . Chronic kidney disease 04/12/2016  . Elevated liver enzymes 04/12/2016  . Depression 04/12/2016  . Anxiety 04/12/2016  . Neuropathy, alcoholic (HCC) 02/22/2016  . History of adenomatous polyp of colon 03/10/2015  . Alcohol abuse, continuous 03/07/2014   Becky Saxasey Lavern Crimi, LPTA; CBIS [phone]  Juel BurrowCockerham, Delana Manganello Jo 03/28/2017, 2:33 PM  [place] [facility] [address] [place], [place], [address] Phone: [phone]   Fax:  [phone]  Name: Rolland PorterRobert Negrete MRN: [id number] Date of Birth: [birth year]

## 2017-03-31 ENCOUNTER — Ambulatory Visit (HOSPITAL_COMMUNITY): Payer: Managed Care, Other (non HMO)

## 2017-03-31 ENCOUNTER — Encounter (HOSPITAL_COMMUNITY): Payer: Self-pay

## 2017-03-31 DIAGNOSIS — M6281 Muscle weakness (generalized): Secondary | ICD-10-CM

## 2017-03-31 DIAGNOSIS — M546 Pain in thoracic spine: Secondary | ICD-10-CM | POA: Diagnosis not present

## 2017-03-31 DIAGNOSIS — R293 Abnormal posture: Secondary | ICD-10-CM

## 2017-03-31 NOTE — Therapy (Signed)
[facility] [address] [place], [place], [address] Phone: [phone]   Fax:  [phone]  Physical Therapy Treatment  Patient Details  Name: Tony Patterson MRN: [id number] Date of Birth: [birth year] Referring Provider: Cherrie DistanceBenjamin Ditty, MD  Encounter Date: 03/31/2017      PT End of Session - 03/31/17 1256    Visit Number 4   Number of Visits 9   Date for PT Re-Evaluation 04/17/17   Authorization Type Aet[employer] Managed   Authorization Time Period 03/20/17 to 04/17/17   Authorization - Visit Number 4   Authorization - Number of Visits 60   PT Start Time 1300   PT Stop Time 1340   PT Time Calculation (min) 40 min   Activity Tolerance Patient tolerated treatment well   Behavior [facility] Therapy [facility] for tasks assessed/performed      Past Medical History:  Diagnosis Date  . Alcohol abuse   . Anemia   . Anxiety   . Hypertension   . Neuropathy   . Seizures (HCC)    unknown etiology and on no meds; been 4 years since seizure.    Past Surgical History:  Procedure Laterality Date  . COLONOSCOPY  05/18/2007   LKG:[id number]:Internal hemorrhoids, a single anal papilla, otherwise normal/ Single polyp, as described above, removed   . COLONOSCOPY WITH PROPOFOL N/A 03/16/2015   Procedure: COLONOSCOPY WITH PROPOFOL;  Surgeon: Corbin Adeobert M Rourk, MD;  Location: [facility];  Service: Endoscopy;  Laterality: N/A;  in cecum at 1344. withdrawal time  8min  . THROAT SURGERY     polyps removed x2    There were no vitals filed for this visit.      Subjective Assessment - 03/31/17 1302    Subjective Pt states that he feels okay today. He only has a little bit of pain.    Patient Stated Goals get 100%   Currently in Pain? Yes   Pain Score 2    Pain Location Back   Pain Orientation Lower   Pain Descriptors / Indicators Aching   Pain Type Chronic pain   Pain Onset More than a month ago   Pain Frequency Intermittent   Aggravating Factors  sitting, standing,  lifting, supine flat   Pain Relieving Factors leaning against counter relief, recliner comfort   Effect of Pain on Daily Activities increases                [facility] Adult PT Treatment/Exercise - 03/31/17 0001      Lumbar Exercises: Stretches   ITB Stretch Limitations s/l thoracic rotation (hips/knees 90/90) 3x30" each   Piriformis Stretch Limitations corner stretch 1x30 seconds (irritated R RTC injury), doorway stretch 2x30 (hands low)     Lumbar Exercises: Aerobic   UBE (Upper Arm Bike) x3 mins retro for postural strengthening     Lumbar Exercises: Standing   Scapular Retraction 10 reps;Theraband   Theraband Level (Scapular Retraction) Level 3 (Green)   Shoulder Extension 10 reps;Theraband   Theraband Level (Shoulder Extension) Level 3 (Green)   Other Standing Lumbar Exercises fwd step ups on 6" x10 each   Other Standing Lumbar Exercises Y's on wall with liftoff x10 reps     Lumbar Exercises: Seated   LAQ on Ball Limitations thoracic extension roll back on stool (arms on table) 10x10" holds   Hip Flexion on Ball Limitations thoracic extension over 1/2 foam roll at 3 different segments x3-5" holds each  PT Education - 03/31/17 1257    Education provided Yes   Person(s) Educated Patient   Methods Explanation;Demonstration   Comprehension Verbalized understanding;Returned demonstration          PT Short Term Goals - 03/20/17 1638      PT SHORT TERM GOAL #1   Title Pt will be independent with HEP and perform consistently in order to promote return to PLOF and decrease risk for reinjury.   Time 2   Period Weeks   Status New     PT SHORT TERM GOAL #2   Title Pt will be able to properly demonstrate setup and use of lumbar roll when sitting and maintain proper sitting posture at least 75% of the time in order to maximize posture and decrease pain.   Time 2   Period Weeks   Status New           PT Long Term Goals - 03/20/17 1640      PT LONG  TERM GOAL #1   Title Pt will have improved lumbar flexion AROM to mod limitations and thoracic extension to mod limitations in order to decrease stress on lower thoracic spine and decrease risk for reinjury   Time 4   Period Weeks   Status New   Target Date 04/17/17     PT LONG TERM GOAL #2   Title Pt will have improved BLE MMT to at least 4+/5 to decrease back pain and maximize function at work.   Time 4   Period Weeks   Status New     PT LONG TERM GOAL #3   Title Pt will be able to properly demonstrate lifting 30# floor to chest, with proper mechanics, and no reports of back pain in order to decrease risk of injury to his back and maximize function at work.   Time 4   Period Weeks   Status New               Plan - 03/31/17 1337    Clinical Impression Statement Session focused on improving thoracic mobility, postural strengthening, and glute strengthening. Pt noted with significant tightness in thoracic rotation and extension; added sidelying thoracic rotation to HEP. He was limited in some exercises due to his R RTC injury, but otherwise tolerated well. Continue POC as planned.    Rehab Potential Good   PT Frequency 2x / week   PT Duration 4 weeks   PT Treatment/Interventions ADLs/Self Care Home Management;Cryotherapy;Electrical Stimulation;Moist Heat;Gait training;Stair training;Functional mobility training;Therapeutic activities;Therapeutic exercise;Balance training;Neuromuscular re-education;Patient/family education;Manual techniques;Passive range of motion;Dry needling;Energy conservation;Taping   PT Next Visit Plan add band pulls; Continue progression of postural strengthening, thoracic ROM, glute strengthening.  complete manual to musculature for pain and ROM if needed.     PT Home Exercise Plan eval: sitting with lumbar roll, standing scap retraction and shoulder ext with GTB; 10/29: s/l thoracic rotation   Consulted and Agree with Plan of Care Patient      Patient  will benefit from skilled therapeutic intervention in order to improve the following deficits and impairments:  Decreased knowledge of precautions, Decreased mobility, Decreased range of motion, Decreased strength, Hypomobility, Increased fascial restricitons, Increased muscle spasms, Impaired flexibility, Improper body mechanics, Postural dysfunction, Pain  Visit Diagnosis: Pain in thoracic spine  Abnormal posture  Muscle weakness (generalized)     Problem List Patient Active Problem List   Diagnosis Date Noted  . ETOH abuse   . Alcoholic peripheral neuropathy (HCC)   . Non-traumatic  rhabdomyolysis   . AKI (acute kidney injury) (HCC)   . Benign essential HTN   . Fall   . Community acquired pneumonia   . Hypokalemia   . Hypomagnesemia   . Hypoalbuminemia due to protein-calorie malnutrition (HCC)   . Transaminitis   . Acute blood loss anemia   . Acute respiratory failure with hypoxia (HCC)   . Altered mental status 04/12/2016  . Alcohol withdrawal seizure (HCC) 04/12/2016  . Rhabdomyolysis 04/12/2016  . Hypertension 04/12/2016  . Dyslipidemia 04/12/2016  . Chronic kidney disease 04/12/2016  . Elevated liver enzymes 04/12/2016  . Depression 04/12/2016  . Anxiety 04/12/2016  . Neuropathy, alcoholic (HCC) 02/22/2016  . History of adenomatous polyp of colon 03/10/2015  . Alcohol abuse, continuous 03/07/2014      Jac Canavan PT, DPT  [facility] [address] [place], [place], [address] Phone: [phone]   Fax:  [phone]  Name: Tony Patterson MRN: [id number] Date of Birth: 1956-02-16

## 2017-03-31 NOTE — Patient Instructions (Signed)
  Sidelying Thoracic Rotation  Lying on side, hips and knees at 90 degrees. Rotate hand and head backwards until stretch is felt in mid back area.  Perform both directions but face the wall and keep your knees touching the wall during the stretch  Perform 1x/day, 3-5 stretches, holding for 30-60 seconds each

## 2017-04-01 ENCOUNTER — Encounter: Payer: Self-pay | Admitting: Cardiology

## 2017-04-02 ENCOUNTER — Encounter (HOSPITAL_COMMUNITY): Payer: Self-pay

## 2017-04-02 ENCOUNTER — Ambulatory Visit (HOSPITAL_COMMUNITY): Payer: Managed Care, Other (non HMO)

## 2017-04-02 DIAGNOSIS — R293 Abnormal posture: Secondary | ICD-10-CM

## 2017-04-02 DIAGNOSIS — M546 Pain in thoracic spine: Secondary | ICD-10-CM | POA: Diagnosis not present

## 2017-04-02 DIAGNOSIS — M6281 Muscle weakness (generalized): Secondary | ICD-10-CM

## 2017-04-02 NOTE — Therapy (Signed)
[facility] [address] [place], [place], [address] Phone: [phone]   Fax:  [phone]  Physical Therapy Treatment  Patient Details  Name: Tony Patterson MRN: [id number] Date of Birth: 16-Dec-1955 Referring Provider: Cherrie Distance, MD  Encounter Date: 04/02/2017      PT End of Session - 04/02/17 1305    Visit Number 5   Number of Visits 9   Date for PT Re-Evaluation 04/17/17   Authorization Type A[employer] Managed   Authorization Time Period 03/20/17 to 04/17/17   Authorization - Visit Number 5   Authorization - Number of Visits 60   PT Start Time 1301   PT Stop Time 1342   PT Time Calculation (min) 41 min   Activity Tolerance Patient tolerated treatment well   Behavior During Therapy [facility] for tasks assessed/performed      Past Medical History:  Diagnosis Date  . Alcohol abuse   . Anemia   . Anxiety   . Hypertension   . Neuropathy   . Seizures (HCC)    unknown etiology and on no meds; been 4 years since seizure.    Past Surgical History:  Procedure Laterality Date  . COLONOSCOPY  05/18/2007   ONG:EXBMWUXL hemorrhoids, a single anal papilla, otherwise normal/ Single polyp, as described above, removed   . COLONOSCOPY WITH PROPOFOL N/A 03/16/2015   Procedure: COLONOSCOPY WITH PROPOFOL;  Surgeon: Corbin Ade, MD;  Location: [facility];  Service: Endoscopy;  Laterality: N/A;  in cecum at 1344. withdrawal time   . THROAT SURGERY     polyps removed x2    There were no vitals filed for this visit.      Subjective Assessment - 04/02/17 1304    Subjective Pt stated he is feeling good today, stated his lower back has bothered him some.     Patient Stated Goals get 100%   Currently in Pain? No/denies             [facility] Adult PT Treatment/Exercise - 04/02/17 0001      Lumbar Exercises: Aerobic   UBE (Upper Arm Bike) x3 mins retro for postural strengthening     Lumbar Exercises: Standing   Scapular Retraction 15  reps;Theraband   Theraband Level (Scapular Retraction) Level 3 (Green)   Row 15 reps   Theraband Level (Row) Level 3 (Green)   Shoulder Extension 15 reps;Theraband   Theraband Level (Shoulder Extension) Level 3 (Green)   Shoulder ADduction Both;10 reps;Theraband   Theraband Level (Shoulder Adduction) Level 2 (Red)   Shoulder Adduction Limitations horizontal abduction  Rt shoulder crepitus wiht movement   Other Standing Lumbar Exercises wall pushup 10x   Other Standing Lumbar Exercises Y's on wall with liftoff x15 reps     Lumbar Exercises: Seated   LAQ on Ball Limitations thoracic extension roll back on stool (arms on table) 10x10" holds   Hip Flexion on Ball Limitations thoracic extension over 1/2 foam roll at 3 different segments x3-5" holds each     Lumbar Exercises: Supine   Other Supine Lumbar Exercises pec stretch on 1/2 roll x 2 min                  PT Short Term Goals - 03/20/17 1638      PT SHORT TERM GOAL #1   Title Pt will be independent with HEP and perform consistently in order to promote return to PLOF and decrease risk for reinjury.   Time 2   Period  Weeks   Status New     PT SHORT TERM GOAL #2   Title Pt will be able to properly demonstrate setup and use of lumbar roll when sitting and maintain proper sitting posture at least 75% of the time in order to maximize posture and decrease pain.   Time 2   Period Weeks   Status New           PT Long Term Goals - 03/20/17 1640      PT LONG TERM GOAL #1   Title Pt will have improved lumbar flexion AROM to mod limitations and thoracic extension to mod limitations in order to decrease stress on lower thoracic spine and decrease risk for reinjury   Time 4   Period Weeks   Status New   Target Date 04/17/17     PT LONG TERM GOAL #2   Title Pt will have improved BLE MMT to at least 4+/5 to decrease back pain and maximize function at work.   Time 4   Period Weeks   Status New     PT LONG TERM GOAL #3    Title Pt will be able to properly demonstrate lifting 30# floor to chest, with proper mechanics, and no reports of back pain in order to decrease risk of injury to his back and maximize function at work.   Time 4   Period Weeks   Status New               Plan - 04/02/17 1347    Clinical Impression Statement Continued session focus with thoracic mobility and postural strengthening.  Added 1/2 roll supine position to address pectoralis tightness and progressed postural strenghtening wiith additinal therex including theraband horizontal abduction and wall push-ups.  No reports of pain through session, noted crepitus Rt shoulder with theraband exercises (Rt RTC injury).     Rehab Potential Good   PT Frequency 2x / week   PT Duration 4 weeks   PT Treatment/Interventions ADLs/Self Care Home Management;Cryotherapy;Electrical Stimulation;Moist Heat;Gait training;Stair training;Functional mobility training;Therapeutic activities;Therapeutic exercise;Balance training;Neuromuscular re-education;Patient/family education;Manual techniques;Passive range of motion;Dry needling;Energy conservation;Taping   PT Next Visit Plan Continue progression of postural strengthening, thoracic ROM, glute strengthening.  complete manual to musculature for pain and ROM if needed.     PT Home Exercise Plan eval: sitting with lumbar roll, standing scap retraction and shoulder ext with GTB; 10/29: s/l thoracic rotation      Patient will benefit from skilled therapeutic intervention in order to improve the following deficits and impairments:  Decreased knowledge of precautions, Decreased mobility, Decreased range of motion, Decreased strength, Hypomobility, Increased fascial restricitons, Increased muscle spasms, Impaired flexibility, Improper body mechanics, Postural dysfunction, Pain  Visit Diagnosis: Pain in thoracic spine  Abnormal posture  Muscle weakness (generalized)     Problem List Patient Active  Problem List   Diagnosis Date Noted  . ETOH abuse   . Alcoholic peripheral neuropathy (HCC)   . Non-traumatic rhabdomyolysis   . AKI (acute kidney injury) (HCC)   . Benign essential HTN   . Fall   . Community acquired pneumonia   . Hypokalemia   . Hypomagnesemia   . Hypoalbuminemia due to protein-calorie malnutrition (HCC)   . Transaminitis   . Acute blood loss anemia   . Acute respiratory failure with hypoxia (HCC)   . Altered mental status 04/12/2016  . Alcohol withdrawal seizure (HCC) 04/12/2016  . Rhabdomyolysis 04/12/2016  . Hypertension 04/12/2016  . Dyslipidemia 04/12/2016  . Chronic kidney  disease 04/12/2016  . Elevated liver enzymes 04/12/2016  . Depression 04/12/2016  . Anxiety 04/12/2016  . Neuropathy, alcoholic (HCC) 02/22/2016  . History of adenomatous polyp of colon 03/10/2015  . Alcohol abuse, continuous 03/07/2014   Becky Sax, LPTA; CBIS [phone]  Tony Patterson 04/02/2017, 1:52 PM  [facility] [address] [place], [place], [address] Phone: [phone]   Fax:  [phone]  Name: Tony Patterson MRN: [id number] Date of Birth: 12-07-1955

## 2017-04-07 ENCOUNTER — Telehealth (HOSPITAL_COMMUNITY): Payer: Self-pay | Admitting: Internal Medicine

## 2017-04-07 ENCOUNTER — Ambulatory Visit (HOSPITAL_COMMUNITY): Payer: BLUE CROSS/BLUE SHIELD

## 2017-04-07 NOTE — Telephone Encounter (Signed)
04/07/17  left a message to cx Monday and Wed due to his nephew coming in from the [place]

## 2017-04-08 DIAGNOSIS — Z23 Encounter for immunization: Secondary | ICD-10-CM | POA: Diagnosis not present

## 2017-04-09 ENCOUNTER — Encounter (HOSPITAL_COMMUNITY): Payer: Self-pay

## 2017-04-09 ENCOUNTER — Other Ambulatory Visit: Payer: Self-pay

## 2017-04-09 ENCOUNTER — Encounter (HOSPITAL_COMMUNITY): Payer: Managed Care, Other (non HMO)

## 2017-04-09 ENCOUNTER — Ambulatory Visit (HOSPITAL_COMMUNITY): Payer: BLUE CROSS/BLUE SHIELD | Attending: Neurological Surgery

## 2017-04-09 DIAGNOSIS — M6281 Muscle weakness (generalized): Secondary | ICD-10-CM

## 2017-04-09 DIAGNOSIS — R293 Abnormal posture: Secondary | ICD-10-CM

## 2017-04-09 DIAGNOSIS — M546 Pain in thoracic spine: Secondary | ICD-10-CM | POA: Diagnosis not present

## 2017-04-09 NOTE — Therapy (Signed)
[facility] [address] [place], [place], [address] Phone: [phone]   Fax:  [phone]  Physical Therapy Treatment  Patient Details  Name: Tony Patterson MRN: [id number] Date of Birth: 08-13-1955 Referring Provider: Cherrie DistanceBenjamin Ditty, MD   Encounter Date: 04/09/2017  PT End of Session - 04/09/17 1302    Visit Number  6    Number of Visits  9    Date for PT Re-Evaluation  04/17/17    Authorization Type  Aetna Managed    Authorization Time Period  03/20/17 to 04/17/17    Authorization - Visit Number  6    Authorization - Number of Visits  60    PT Start Time  1300    PT Stop Time  1341    PT Time Calculation (min)  41 min    Activity Tolerance  Patient tolerated treatment well    Behavior During Therapy  [facility] for tasks assessed/performed       Past Medical History:  Diagnosis Date  . Alcohol abuse   . Anemia   . Anxiety   . Hypertension   . Neuropathy   . Seizures (HCC)    unknown etiology and on no meds; been 4 years since seizure.    Past Surgical History:  Procedure Laterality Date  . COLONOSCOPY  05/18/2007   ONG:EXBMWUXLRMR:Internal hemorrhoids, a single anal papilla, otherwise normal/ Single polyp, as described above, removed   . THROAT SURGERY     polyps removed x2    There were no vitals filed for this visit.  Subjective Assessment - 04/09/17 1302    Subjective  Pt states that he feels really good today. He is being compliant with HEP.     Patient Stated Goals  get 100%    Currently in Pain?  No/denies             [facility] Adult PT Treatment/Exercise - 04/09/17 0001      Lumbar Exercises: Aerobic   UBE (Upper Arm Bike)  x3 mins retro for postural strengthening, L2      Lumbar Exercises: Standing   Functional Squats  --    Functional Squats Limitations  --    Lifting  From waist;5 reps    Lifting Weights (lbs)  10    Lifting Limitations  simulating lifting flower arrangements and placing in [employer]    Push / Pull  Sled  fwd/lat step ups on 6" step x10 reps each    Row  AROM;Strengthening;10 reps face pull to Y press   face pull to Y press   Theraband Level (Row)  Level 3 (Green)    Theraband Level (Shoulder Adduction)  --    Shoulder Adduction Limitations  D2 flexion with RTB 2x10 reps BUE    Other Standing Lumbar Exercises  wall pushup 2x15    Other Standing Lumbar Exercises  shoulder abduction band pulls, green, 1x 15      Lumbar Exercises: Seated   Other Seated Lumbar Exercises  3D thoracic excursion with UE movementx x5 reps each UE      Lumbar Exercises: Supine   Other Supine Lumbar Exercises  pec stretch on 1/2 roll x 2 min            PT Education - 04/09/17 1302    Education provided  Yes    Education Details  Architectural technologistlifting mechanics, updated HEP    Person(s) Educated  Patient    Methods  Explanation;Demonstration;Handout    Comprehension  Verbalized  understanding;Returned demonstration       PT Short Term Goals - 03/20/17 1638      PT SHORT TERM GOAL #1   Title  Pt will be independent with HEP and perform consistently in order to promote return to PLOF and decrease risk for reinjury.    Time  2    Period  Weeks    Status  New      PT SHORT TERM GOAL #2   Title  Pt will be able to properly demonstrate setup and use of lumbar roll when sitting and maintain proper sitting posture at least 75% of the time in order to maximize posture and decrease pain.    Time  2    Period  Weeks    Status  New        PT Long Term Goals - 03/20/17 1640      PT LONG TERM GOAL #1   Title  Pt will have improved lumbar flexion AROM to mod limitations and thoracic extension to mod limitations in order to decrease stress on lower thoracic spine and decrease risk for reinjury    Time  4    Period  Weeks    Status  New    Target Date  04/17/17      PT LONG TERM GOAL #2   Title  Pt will have improved BLE MMT to at least 4+/5 to decrease back pain and maximize function at work.    Time  4     Period  Weeks    Status  New      PT LONG TERM GOAL #3   Title  Pt will be able to properly demonstrate lifting 30# floor to chest, with proper mechanics, and no reports of back pain in order to decrease risk of injury to his back and maximize function at work.    Time  4    Period  Weeks    Status  New            Plan - 04/09/17 1342    Clinical Impression Statement  Pt presented to therapy with no reports of pain and general improvements overall. Session continued to focus on thoracic mobility, postural strengthening, gluteal strengthening and lifting mechanics to simulate work duties. Pt tolerated all therex well, just demo'd fatigue. He required min cues for proper lifting techniques, but was able to demo understanding. UPdated HEP for continued therex mobility. Will continue POC as planned.     Rehab Potential  Good    PT Frequency  2x / week    PT Duration  4 weeks    PT Treatment/Interventions  ADLs/Self Care Home Management;Cryotherapy;Electrical Stimulation;Moist Heat;Gait training;Stair training;Functional mobility training;Therapeutic activities;Therapeutic exercise;Balance training;Neuromuscular re-education;Patient/family education;Manual techniques;Passive range of motion;Dry needling;Energy conservation;Taping    PT Next Visit Plan  Continue progression of postural strengthening, thoracic ROM. complete manual to musculature for pain and ROM if needed; continue glute strengthening and lifting mechanics    PT Home Exercise Plan  eval: sitting with lumbar roll, standing scap retraction and shoulder ext with GTB; 10/29: s/l thoracic rotation; 11/7: 3D thoracic excursions with UE, thoracic ext over towel roll    Consulted and Agree with Plan of Care  Patient       Patient will benefit from skilled therapeutic intervention in order to improve the following deficits and impairments:  Decreased knowledge of precautions, Decreased mobility, Decreased range of motion, Decreased  strength, Hypomobility, Increased fascial restricitons, Increased muscle spasms, Impaired flexibility, Improper  body mechanics, Postural dysfunction, Pain  Visit Diagnosis: Pain in thoracic spine  Abnormal posture  Muscle weakness (generalized)     Problem List Patient Active Problem List   Diagnosis Date Noted  . ETOH abuse   . Alcoholic peripheral neuropathy (HCC)   . Non-traumatic rhabdomyolysis   . AKI (acute kidney injury) (HCC)   . Benign essential HTN   . Fall   . Community acquired pneumonia   . Hypokalemia   . Hypomagnesemia   . Hypoalbuminemia due to protein-calorie malnutrition (HCC)   . Transaminitis   . Acute blood loss anemia   . Acute respiratory failure with hypoxia (HCC)   . Altered mental status 04/12/2016  . Alcohol withdrawal seizure (HCC) 04/12/2016  . Rhabdomyolysis 04/12/2016  . Hypertension 04/12/2016  . Dyslipidemia 04/12/2016  . Chronic kidney disease 04/12/2016  . Elevated liver enzymes 04/12/2016  . Depression 04/12/2016  . Anxiety 04/12/2016  . Neuropathy, alcoholic (HCC) 02/22/2016  . History of adenomatous polyp of colon 03/10/2015  . Alcohol abuse, continuous 03/07/2014       Jac CanavanBrooke Powell PT, DPT  [facility] [address] [place], [place], [address] Phone: [phone]   Fax:  [phone]  Name: Tony PorterRobert Patterson MRN: [id number] Date of Birth: 09/03/1955

## 2017-04-09 NOTE — Patient Instructions (Signed)
  Seated Thoracic Extension  Seated in a chair with foam roller (1/2 foam roll, rolled up towel, or two tennis balls in sock) on a given point of your thoracic vertebrae. Place hands interlaced behind neck to inhibit movement through cervical spine. Using the foam roll as a fulcrum extension trunk over roll. Initiate movement from chest neck head or neck. Be aware of low back as well, it will want to compensate for poor mobility in trunk. Keep as engaged and only allow movement in thoracic region. Repeat as instructed. THEN move roll up or down and repeat sequence.   Perform 1x/day, 2-3 sets of 5-10 reps at 3-4 different segments    3D thoracic excursion with arm movements (see other handout) Perform 1x/day, 2-3 sets of 5-10 rep

## 2017-04-11 ENCOUNTER — Ambulatory Visit (HOSPITAL_COMMUNITY): Payer: BLUE CROSS/BLUE SHIELD

## 2017-04-11 ENCOUNTER — Ambulatory Visit
Admission: RE | Admit: 2017-04-11 | Discharge: 2017-04-11 | Disposition: A | Discharge status: Home / Self Care | Payer: Managed Care, Other (non HMO) | Source: Ambulatory Visit | Attending: Neurological Surgery | Admitting: Neurological Surgery

## 2017-04-11 ENCOUNTER — Encounter (HOSPITAL_COMMUNITY): Payer: Self-pay

## 2017-04-11 DIAGNOSIS — S22081A Stable burst fracture of T11-T12 vertebra, initial encounter for closed fracture: Secondary | ICD-10-CM

## 2017-04-11 DIAGNOSIS — R293 Abnormal posture: Secondary | ICD-10-CM | POA: Diagnosis not present

## 2017-04-11 DIAGNOSIS — M6281 Muscle weakness (generalized): Secondary | ICD-10-CM

## 2017-04-11 DIAGNOSIS — Z13820 Encounter for screening for osteoporosis: Secondary | ICD-10-CM | POA: Diagnosis not present

## 2017-04-11 DIAGNOSIS — M546 Pain in thoracic spine: Secondary | ICD-10-CM

## 2017-04-11 NOTE — Therapy (Signed)
[facility] [address] [place], [place], [address] Phone: [phone]   Fax:  [phone]  Physical Therapy Treatment  Patient Details  Name: Rolland PorterRobert Toppins MRN: [id number] Date of Birth: December 30, 1955 Referring Provider: Cherrie DistanceBenjamin Ditty, MD   Encounter Date: 04/11/2017  PT End of Session - 04/11/17 1525    Visit Number  7    Number of Visits  9    Date for PT Re-Evaluation  04/17/17    Authorization Type  Aetna Managed    Authorization Time Period  03/20/17 to 04/17/17    Authorization - Visit Number  7    Authorization - Number of Visits  60    PT Start Time  1522    PT Stop Time  1610    PT Time Calculation (min)  48 min    Activity Tolerance  Patient tolerated treatment well    Behavior During Therapy  [facility] for tasks assessed/performed       Past Medical History:  Diagnosis Date  . Alcohol abuse   . Anemia   . Anxiety   . Hypertension   . Neuropathy   . Seizures (HCC)    unknown etiology and on no meds; been 4 years since seizure.    Past Surgical History:  Procedure Laterality Date  . COLONOSCOPY  05/18/2007   VZD:GLOVFIEPRMR:Internal hemorrhoids, a single anal papilla, otherwise normal/ Single polyp, as described above, removed   . THROAT SURGERY     polyps removed x2    There were no vitals filed for this visit.  Subjective Assessment - 04/11/17 1525    Subjective  Pt states that he feels really good today. He states he has left LBP currently at 2/10. He had a bone density scan this morning and worked some prior to coming in today.    Patient Stated Goals  get 100%    Currently in Pain?  Yes    Pain Score  2     Pain Location  Back    Pain Orientation  Left;Lower    Pain Descriptors / Indicators  Aching    Pain Type  Chronic pain    Pain Onset  More than a month ago    Pain Frequency  Intermittent    Aggravating Factors   sitting, standing, lifting, supine flat    Pain Relieving Factors  leaning against counter relief,  recliner comfort    Effect of Pain on Daily Activities  increases           [facility] Adult PT Treatment/Exercise - 04/11/17 0001      Lumbar Exercises: Aerobic   UBE (Upper Arm Bike)  x3 mins retro for postural strengthening, L2      Lumbar Exercises: Standing   Lifting  From waist;5 reps    Lifting Weights (lbs)  15    Lifting Limitations  simulating lifting flower arrangements and placing in [employer]van    Push / Pull Sled  fwd/lat step ups on 6" step x15 reps each    Other Standing Lumbar Exercises  wall pushups 3x10    Other Standing Lumbar Exercises  shoulder taps in modified plantigrade x10 reps each; sidestepping with RTB 915ft x2RT      Lumbar Exercises: Seated   Other Seated Lumbar Exercises  x to v x20 reps      Lumbar Exercises: Supine   Other Supine Lumbar Exercises  pec stretch on 1/2 roll x3 min      Lumbar Exercises: Prone  Other Prone Lumbar Exercises  I, Y, T, W x10 reps each, LUE only (unable with R due to RTC injury)      Lumbar Exercises: Quadruped   Madcat/Old Horse  15 reps    Madcat/Old Horse Limitations  old horse only, no mad cat            PT Education - 04/11/17 1527    Education provided  Yes    Education Details  lifting mechanics, exercise technique    Person(s) Educated  Patient    Methods  Explanation;Demonstration    Comprehension  Verbalized understanding;Returned demonstration       PT Short Term Goals - 03/20/17 1638      PT SHORT TERM GOAL #1   Title  Pt will be independent with HEP and perform consistently in order to promote return to PLOF and decrease risk for reinjury.    Time  2    Period  Weeks    Status  New      PT SHORT TERM GOAL #2   Title  Pt will be able to properly demonstrate setup and use of lumbar roll when sitting and maintain proper sitting posture at least 75% of the time in order to maximize posture and decrease pain.    Time  2    Period  Weeks    Status  New        PT Long Term Goals - 03/20/17 1640       PT LONG TERM GOAL #1   Title  Pt will have improved lumbar flexion AROM to mod limitations and thoracic extension to mod limitations in order to decrease stress on lower thoracic spine and decrease risk for reinjury    Time  4    Period  Weeks    Status  New    Target Date  04/17/17      PT LONG TERM GOAL #2   Title  Pt will have improved BLE MMT to at least 4+/5 to decrease back pain and maximize function at work.    Time  4    Period  Weeks    Status  New      PT LONG TERM GOAL #3   Title  Pt will be able to properly demonstrate lifting 30# floor to chest, with proper mechanics, and no reports of back pain in order to decrease risk of injury to his back and maximize function at work.    Time  4    Period  Weeks    Status  New            Plan - 04/11/17 1616    Clinical Impression Statement  Pt with slight increase in LBP this date, but he reported it to having been to the doctors office and working prior to therapy. Progressed postural strengthening this date with good tolerance. Pt unable to complete prone work with RUE due to prior RTC injury but otherwise, he did not c/o any RUE pain. Increased weight with lifting this date and pt requird min cues for proper technique, but otherwise he reported no pain with lifting. No pain at EOS. Continue POC as planned.    Rehab Potential  Good    PT Frequency  2x / week    PT Duration  4 weeks    PT Treatment/Interventions  ADLs/Self Care Home Management;Cryotherapy;Electrical Stimulation;Moist Heat;Gait training;Stair training;Functional mobility training;Therapeutic activities;Therapeutic exercise;Balance training;Neuromuscular re-education;Patient/family education;Manual techniques;Passive range of motion;Dry needling;Energy conservation;Taping    PT Next  Visit Plan  Continue progression of postural strengthening, thoracic ROM. complete manual to musculature for pain and ROM if needed; continue glute strengthening and lifting mechanics;  continue old horse in quadruped for thoracic ext mobility    PT Home Exercise Plan  eval: sitting with lumbar roll, standing scap retraction and shoulder ext with GTB; 10/29: s/l thoracic rotation; 11/7: 3D thoracic excursions with UE, thoracic ext over towel roll    Consulted and Agree with Plan of Care  Patient       Patient will benefit from skilled therapeutic intervention in order to improve the following deficits and impairments:  Decreased knowledge of precautions, Decreased mobility, Decreased range of motion, Decreased strength, Hypomobility, Increased fascial restricitons, Increased muscle spasms, Impaired flexibility, Improper body mechanics, Postural dysfunction, Pain  Visit Diagnosis: Pain in thoracic spine  Abnormal posture  Muscle weakness (generalized)     Problem List Patient Active Problem List   Diagnosis Date Noted  . ETOH abuse   . Alcoholic peripheral neuropathy (HCC)   . Non-traumatic rhabdomyolysis   . AKI (acute kidney injury) (HCC)   . Benign essential HTN   . Fall   . Community acquired pneumonia   . Hypokalemia   . Hypomagnesemia   . Hypoalbuminemia due to protein-calorie malnutrition (HCC)   . Transaminitis   . Acute blood loss anemia   . Acute respiratory failure with hypoxia (HCC)   . Altered mental status 04/12/2016  . Alcohol withdrawal seizure (HCC) 04/12/2016  . Rhabdomyolysis 04/12/2016  . Hypertension 04/12/2016  . Dyslipidemia 04/12/2016  . Chronic kidney disease 04/12/2016  . Elevated liver enzymes 04/12/2016  . Depression 04/12/2016  . Anxiety 04/12/2016  . Neuropathy, alcoholic (HCC) 02/22/2016  . History of adenomatous polyp of colon 03/10/2015  . Alcohol abuse, continuous 03/07/2014      Jac Canavan PT, DPT  [facility] [address] [place], [place], [address] Phone: [phone]   Fax:  [phone]  Name: Bowe Sidor MRN: [id number] Date of Birth: July 29, 1955

## 2017-04-14 ENCOUNTER — Telehealth (HOSPITAL_COMMUNITY): Payer: Self-pay | Admitting: Internal Medicine

## 2017-04-14 ENCOUNTER — Ambulatory Visit (HOSPITAL_COMMUNITY): Payer: BLUE CROSS/BLUE SHIELD

## 2017-04-14 NOTE — Telephone Encounter (Signed)
04/14/17  pt left a message that he had a bad cold and wouldn't be able to make it to his appt.

## 2017-04-16 ENCOUNTER — Ambulatory Visit (HOSPITAL_COMMUNITY): Payer: BLUE CROSS/BLUE SHIELD

## 2017-04-16 ENCOUNTER — Encounter (HOSPITAL_COMMUNITY): Payer: Self-pay

## 2017-04-16 DIAGNOSIS — R293 Abnormal posture: Secondary | ICD-10-CM | POA: Diagnosis not present

## 2017-04-16 DIAGNOSIS — M546 Pain in thoracic spine: Secondary | ICD-10-CM

## 2017-04-16 DIAGNOSIS — M6281 Muscle weakness (generalized): Secondary | ICD-10-CM

## 2017-04-16 NOTE — Therapy (Signed)
[place], [place], [address] Phone: [phone]   Fax:  [phone]  Physical Therapy Treatment/Discharge Summary  Patient Details  Name: Tony Patterson MRN: [id number] Date of Birth: 05-11-1956 Referring Provider: Cyndy Freeze, MD   Encounter Date: 04/16/2017  PT End of Session - 04/16/17 0903    Visit Number  8    Number of Visits  9    Date for PT Re-Evaluation  04/17/17    Authorization Type  [employer] Managed    Authorization Time Period  03/20/17 to 04/17/17    Authorization - Visit Number  8    Authorization - Number of Visits  60    PT Start Time  0900    PT Stop Time  0927    PT Time Calculation (min)  27 min    Activity Tolerance  Patient tolerated treatment well    Behavior During Therapy  [facility] for tasks assessed/performed       Past Medical History:  Diagnosis Date  . Alcohol abuse   . Anemia   . Anxiety   . Hypertension   . Neuropathy   . Seizures ([place])    unknown etiology and on no meds; been 4 years since seizure.    Past Surgical History:  Procedure Laterality Date  . COLONOSCOPY  05/18/2007   GYF:VCBSWHQP hemorrhoids, a single anal papilla, otherwise normal/ Single polyp, as described above, removed   . THROAT SURGERY     polyps removed x2    There were no vitals filed for this visit.  Subjective Assessment - 04/16/17 0902    Subjective  Pt reports that he didn't feel well the last few days but he feels better today. His back feels pretty good this morning and he reports no pain.     Patient Stated Goals  get 100%    Currently in Pain?  No/denies    Pain Onset  More than a month ago         [facility] PT Assessment - 04/16/17 0001      AROM   Lumbar Flexion  50-75% limited flexion;  was very minimal flexion    Thoracic Extension  50% limited as 75% limited      Strength   Right Hip Extension  4+/5 was 4+    Right Hip ABduction  4/5 was 4    Left Hip Extension  4+/5 was 4     Left Hip ABduction  4/5 was 4-          [facility] Adult PT Treatment/Exercise - 04/16/17 0001      Lumbar Exercises: Aerobic   UBE (Upper Arm Bike)  x3 mins retro for postural strengthening, L2          PT Education - 04/16/17 0903    Education provided  Yes    Education Details  discharge plans, updated HEP    Person(s) Educated  Patient    Methods  Explanation;Handout    Comprehension  Verbalized understanding       PT Short Term Goals - 04/16/17 0903      PT SHORT TERM GOAL #1   Title  Pt will be independent with HEP and perform consistently in order to promote return to PLOF and decrease risk for reinjury.    Time  2    Period  Weeks    Status  Achieved      PT SHORT TERM GOAL #2   Title  Pt will be  able to properly demonstrate setup and use of lumbar roll when sitting and maintain proper sitting posture at least 75% of the time in order to maximize posture and decrease pain.    Time  2    Period  Weeks    Status  Achieved        PT Long Term Goals - 04/16/17 0904      PT LONG TERM GOAL #1   Title  Pt will have improved lumbar flexion AROM to mod limitations and thoracic extension to mod limitations in order to decrease stress on lower thoracic spine and decrease risk for reinjury    Baseline  11/14: lumbar flexion 50-75% limited; thoracic ext 50% limited    Time  4    Period  Weeks    Status  Partially Met      PT LONG TERM GOAL #2   Title  Pt will have improved BLE MMT to at least 4+/5 to decrease back pain and maximize function at work.    Baseline  11/14: hip ext 4+/5, hip abd 4/5    Time  4    Period  Weeks    Status  Partially Met      PT LONG TERM GOAL #3   Title  Pt will be able to properly demonstrate lifting 30# floor to chest, with proper mechanics, and no reports of back pain in order to decrease risk of injury to his back and maximize function at work.    Time  4    Period  Weeks    Status  Achieved            Plan - 04/16/17 0929     Clinical Impression Statement  PT reassessed pt's goals and outcome measures this date. Pt has made great progress towards his goals as illustrated above. Pt reports that this is the best his back has felt in a year. Functionally, he is not limited at home or at work and feels he is where he needs and wants to be. Due to progress made, pt will be discharged to his HEP. PT provided handout of extensive updated HEP and instructions on frequency/duration and pt verbalized understanding.     Rehab Potential  Good    PT Frequency  2x / week    PT Duration  4 weeks    PT Treatment/Interventions  ADLs/Self Care Home Management;Cryotherapy;Electrical Stimulation;Moist Heat;Gait training;Stair training;Functional mobility training;Therapeutic activities;Therapeutic exercise;Balance training;Neuromuscular re-education;Patient/family education;Manual techniques;Passive range of motion;Dry needling;Energy conservation;Taping    PT Next Visit Plan  discharged    PT Home Exercise Plan  eval: sitting with lumbar roll, standing scap retraction and shoulder ext with GTB; 10/29: s/l thoracic rotation; 11/7: 3D thoracic excursions with UE, thoracic ext over towel roll; 11/14: s/l abd, clams with GTB, fwd/lat step ups, sidestepping, standing hip abd, prone I's, Y's, T's,     Consulted and Agree with Plan of Care  Patient       Patient will benefit from skilled therapeutic intervention in order to improve the following deficits and impairments:  Decreased knowledge of precautions, Decreased mobility, Decreased range of motion, Decreased strength, Hypomobility, Increased fascial restricitons, Increased muscle spasms, Impaired flexibility, Improper body mechanics, Postural dysfunction, Pain  Visit Diagnosis: Pain in thoracic spine  Abnormal posture  Muscle weakness (generalized)     Problem List Patient Active Problem List   Diagnosis Date Noted  . ETOH abuse   . Alcoholic peripheral neuropathy ([place])   .  Non-traumatic rhabdomyolysis   .  AKI (acute kidney injury) ([place])   . Benign essential HTN   . Fall   . Community acquired pneumonia   . Hypokalemia   . Hypomagnesemia   . Hypoalbuminemia due to protein-calorie malnutrition ([place])   . Transaminitis   . Acute blood loss anemia   . Acute respiratory failure with hypoxia ([place])   . Altered mental status 04/12/2016  . Alcohol withdrawal seizure ([place]) 04/12/2016  . Rhabdomyolysis 04/12/2016  . Hypertension 04/12/2016  . Dyslipidemia 04/12/2016  . Chronic kidney disease 04/12/2016  . Elevated liver enzymes 04/12/2016  . Depression 04/12/2016  . Anxiety 04/12/2016  . Neuropathy, alcoholic ([place]) 69/62/9528  . History of adenomatous polyp of colon 03/10/2015  . Alcohol abuse, continuous 03/07/2014      PHYSICAL THERAPY DISCHARGE SUMMARY  Visits from Start of Care: 8  Current functional level related to goals / functional outcomes: See clinical impression above   Remaining deficits: See clinical impression above   Education / Equipment: HEP Plan: Patient agrees to discharge.  Patient goals were partially met. Patient is being discharged due to meeting the stated rehab goals.  ?????         Geraldine Solar PT, [place] [address] [place], [place], [address] Phone: [phone]   Fax:  [phone]  Name: Ohn Bostic MRN: [id number] Date of Birth: [birth year]

## 2017-04-16 NOTE — Patient Instructions (Signed)
  HIP ABDUCTION - SIDELYING  While lying on your side, slowly raise up your top leg to the side. Keep your knee straight and maintain your toes pointed forward the entire time. Keep your leg in-line with your body.  The bottom leg can be bent to stabilize your body.   ELASTIC BAND - SIDELYING CLAM - [place]   While lying on your side with your knees bent and an elastic band wrapped around your knees, draw up the top knee while keeping contact of your feet together as shown.   Do not let your pelvis roll back during the lifting movement.     FRONT STEP-UPS  Step up onto stool or step with involved leg.  Step down leading with uninvolved leg.  Repeat.  Can make harder by slowly lowering yourself back down for about a 3-5 second count     LATERAL STEP-UPS  Stand on stool or step with involved leg.  Lower the uninvolved leg until the heel touches the floor, then press back up using the muscles in the involved leg only.  Repeat.  Can make harder by slowly lowering yourself back down for about a 3-5 second count    lateral walk at pole or counter   Standing at a support surface (role or counter top) use hands to assist with balance. Perform sideways walking down and back until you feel that you need to take break.   It may be help to place a chair close to allow for safe rest breaks.    LOOPED ELASTIC BAND HIP ABDUCTION  While standing with an elastic band looped around your ankles, move the target leg out to the side as shown.     Prone Shoulder Horizontal Abduction  -Lay on edge of bed/mat with arm hanging off side -Raise arm away from bed/mat while keeping elbow straight -Keep shoulder back and down without using traps   PRONE Y  Lying face down with your arms stretched out upwards as shown, slowly move your arms upward towards the ceiling as you squeeze your shoulder blades downward and towards your spine.    PRONE W  Lying face down with your elbows bent and  palms facing downward, slowly raise your arms up towards the ceiling as you squeeze your shoulder blades downward and towards your spine.    PRONE LOWER TRAPEZIUS  Lie on belly with arm straight.  Lift arm with thumb up overhead, squeezing shoulder blade down. Hold 2-3 seconds; lower; repeat.    Can do leg stuff one day, shoulder/posture stuff the next day. Don't have to do all of the exercises on those days, just do at least 30 mins worth of something each day.  Perform 2-3 sets of 10-20 reps.  Start with green band for posture, progress to blue, progress to blue and green together once that gets easy.

## 2017-04-21 IMAGING — CR DG CHEST 1V PORT
1 series · 1 of 1 positions shown · non-contrast
Comparison: 04/12/2016 at [DATE] a.m.

CLINICAL DATA: Endotracheal tube placement.  Status post seizure.

EXAM:
PORTABLE CHEST 1 VIEW

[AP]
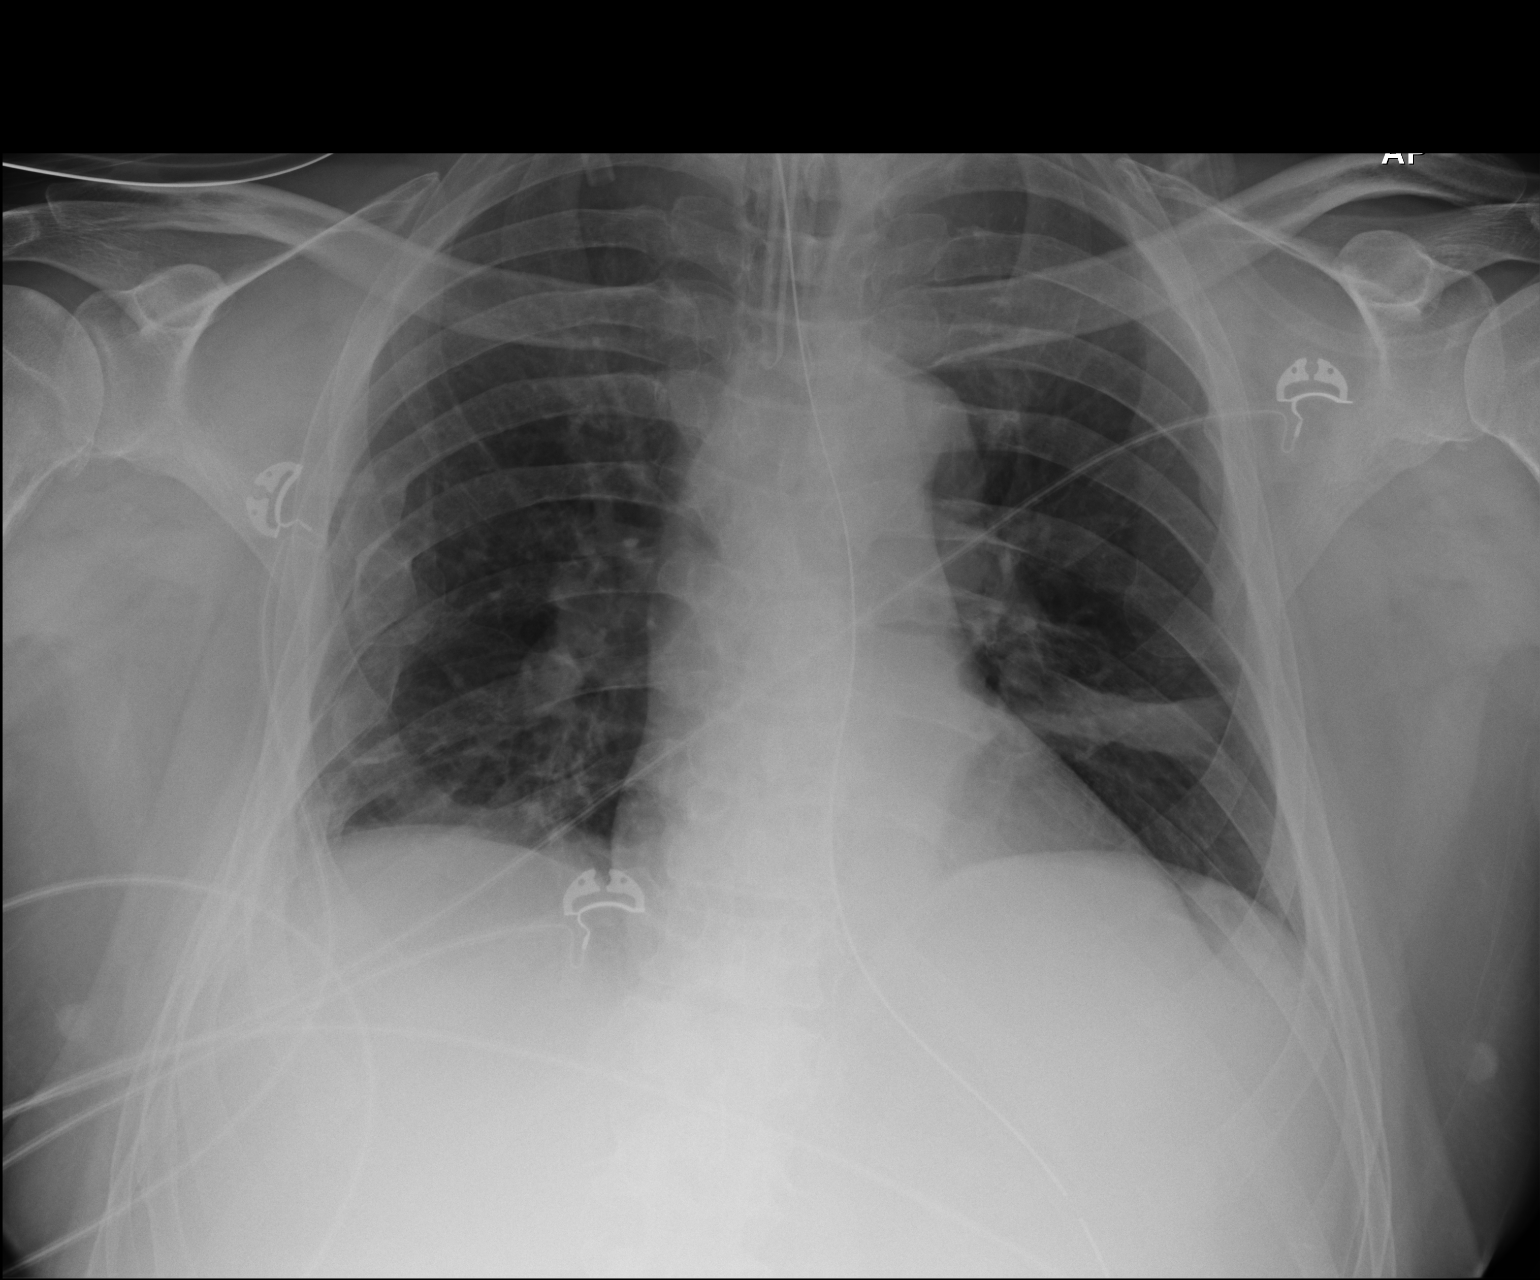

[1 of 1 positions shown; findings below may reference images not displayed]

FINDINGS: Endotracheal tube tip now projects 3.4 cm above the Carina.
Orogastric tube is stable passing below the diaphragm well into the
stomach.

There is discoid type atelectasis in the lower lung on the left.
Mild streaky atelectasis is noted at the right lung base. Lungs
otherwise clear. No convincing pleural effusion. No pneumothorax.
IMPRESSION: 1. Endotracheal tube is well positioned, tip projecting 3.4 cm above
the Carina.
2. Well-positioned nasal/orogastric tube.
3. Mild basilar atelectasis.

## 2017-04-21 IMAGING — CT CT HEAD W/O CM
3 series · 16 of 47 positions shown, 19 images · non-contrast
Comparison: CT head 11/02/2011.

CLINICAL DATA: Unwitnessed seizure today. History of seizure
disorder, but no seizure in 2 years. Fever, tachycardia and
intubation.

EXAM:
CT HEAD WITHOUT CONTRAST
TECHNIQUE: Contiguous axial images were obtained from the base of the skull
through the vertex without intravenous contrast.

[Series 2: head trauma wo · axial · 0.46mm/px · z∈[+1341,+1491]mm · 10 of 36 slices shown, 13 images]
[im 3/36  brain]
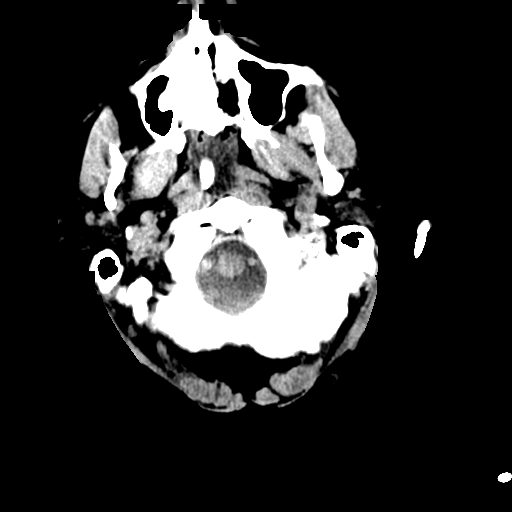
[im 3/36  bone]
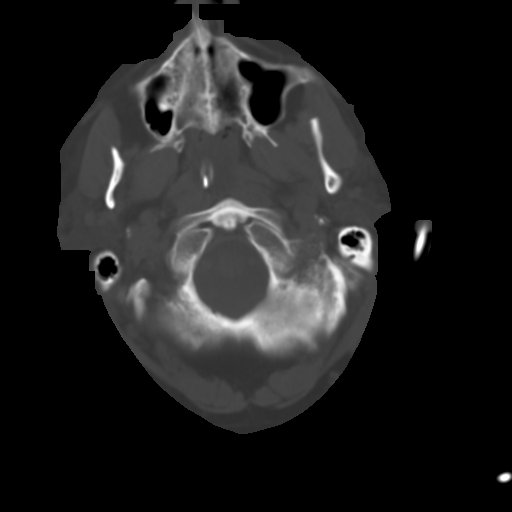
[im 7/36  brain]
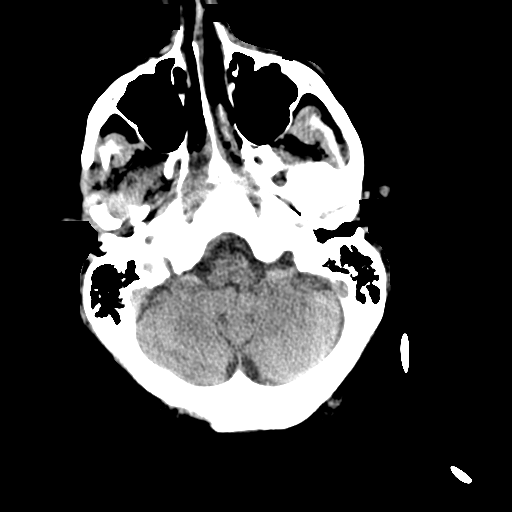
[im 10/36  brain]
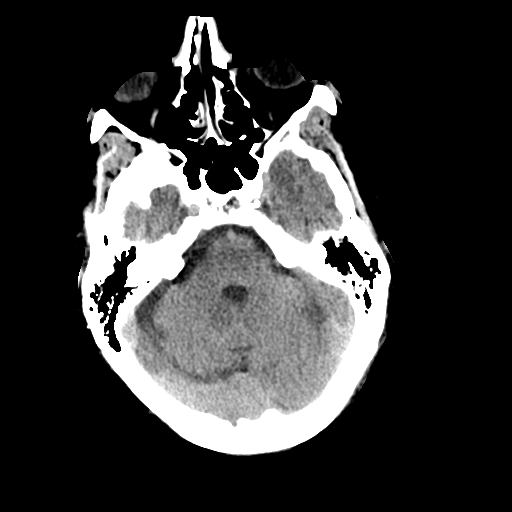
[im 13/36  brain]
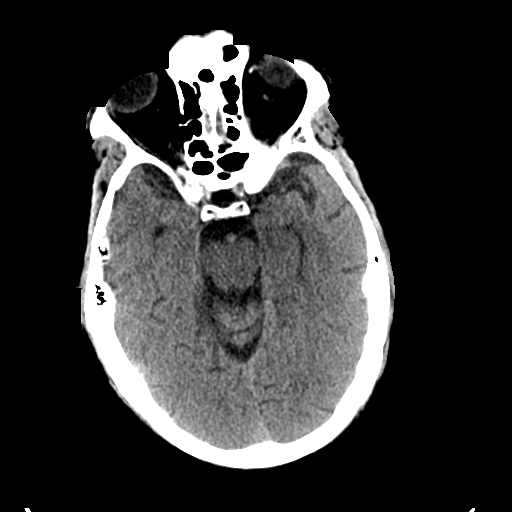
[im 16/36  brain]
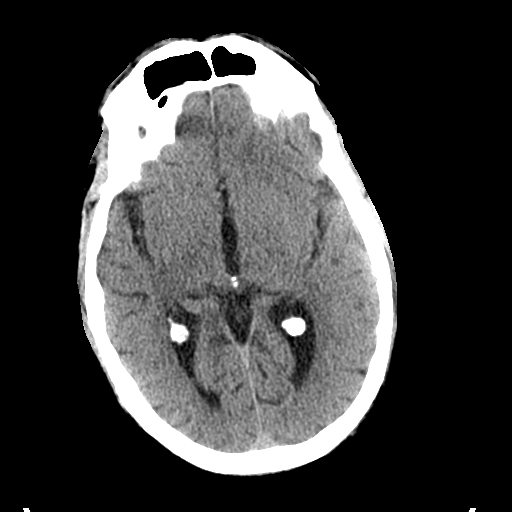
[im 16/36  bone]
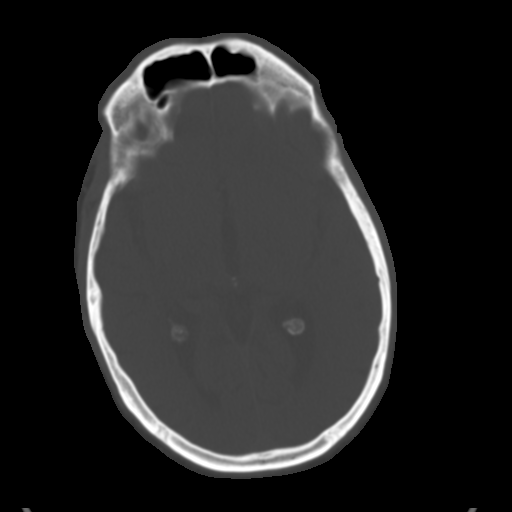
[im 20/36  brain]
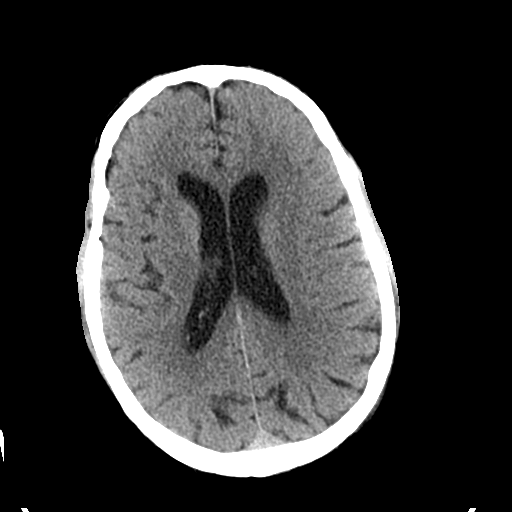
[im 23/36  brain]
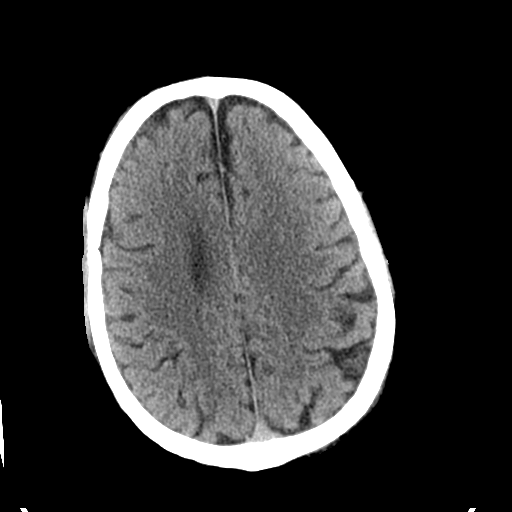
[im 27/36  brain]
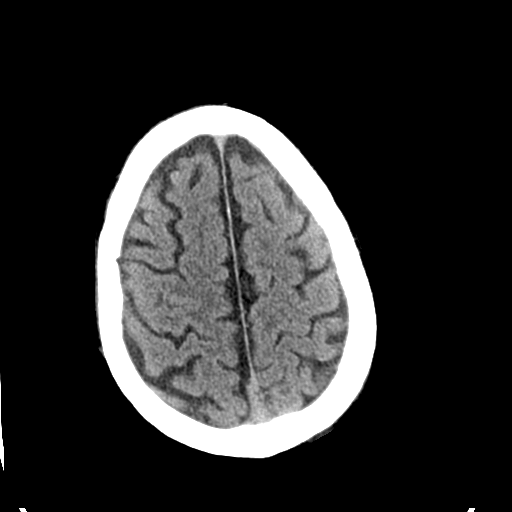
[im 29/36  brain]
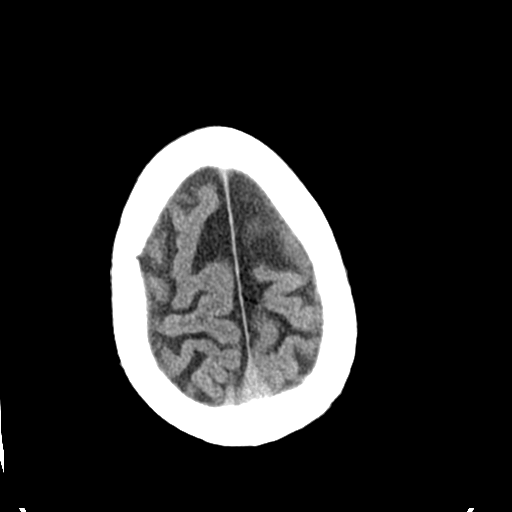
[im 29/36  bone]
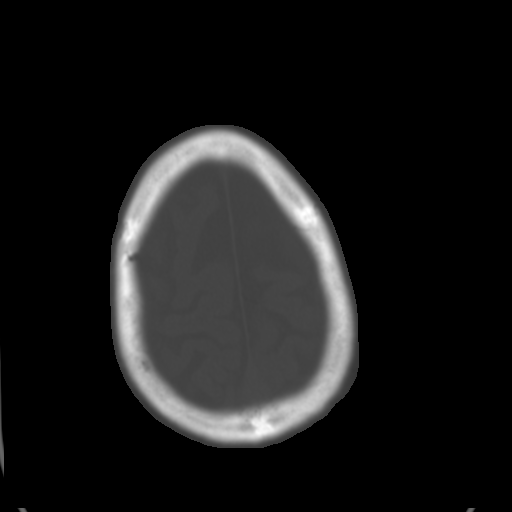
[im 33/36  brain]
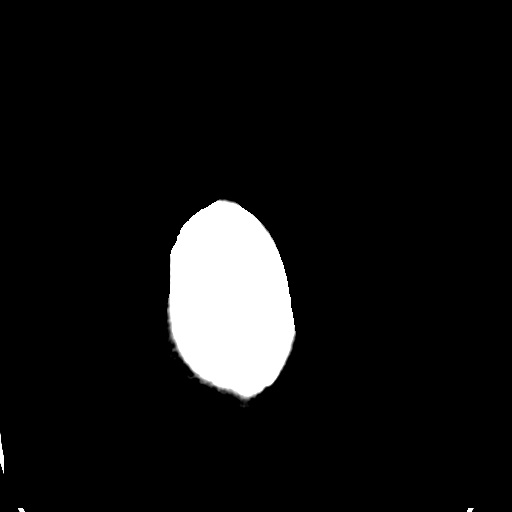

[Series 4: coronal soft tissue · coronal · 0.32mm/px · 3 of 75 slices shown]
[im 25/75  brain]
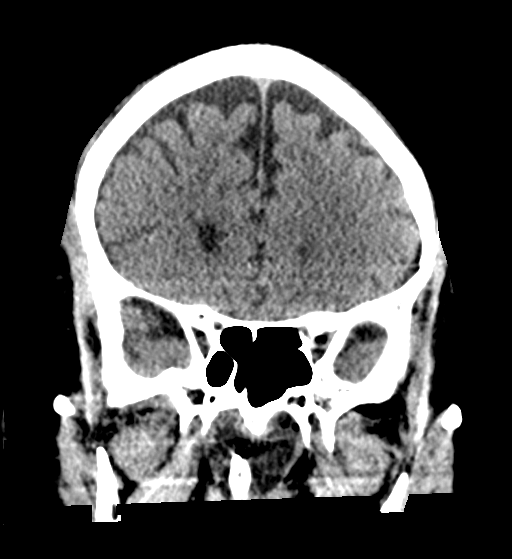
[im 33/75  brain]
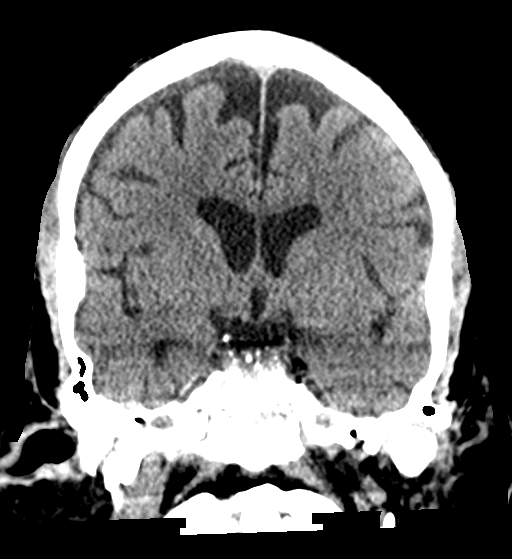
[im 42/75  brain]
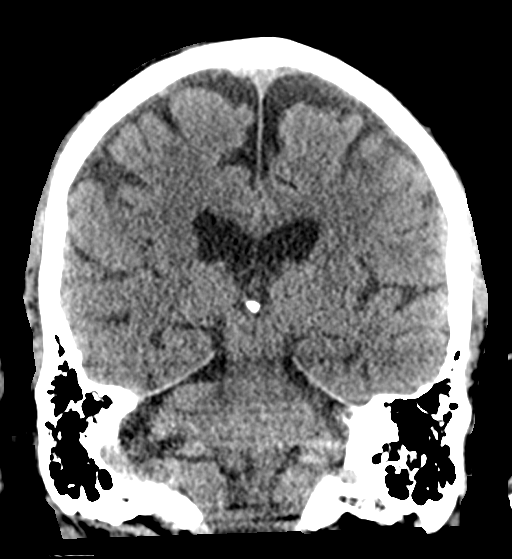

[Series 5: sagittal soft tissue · sagittal · 0.38mm/px · 3 of 55 slices shown]
[im 19/55  brain]
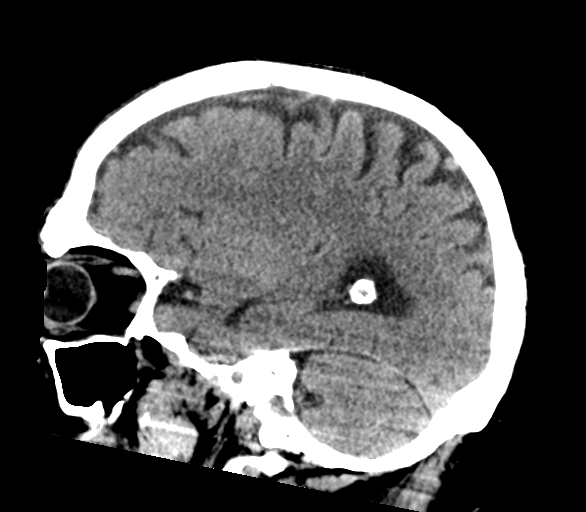
[im 28/55  brain]
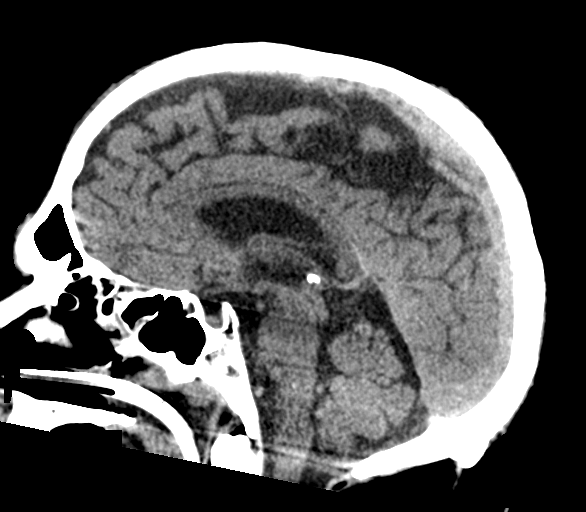
[im 37/55  brain]
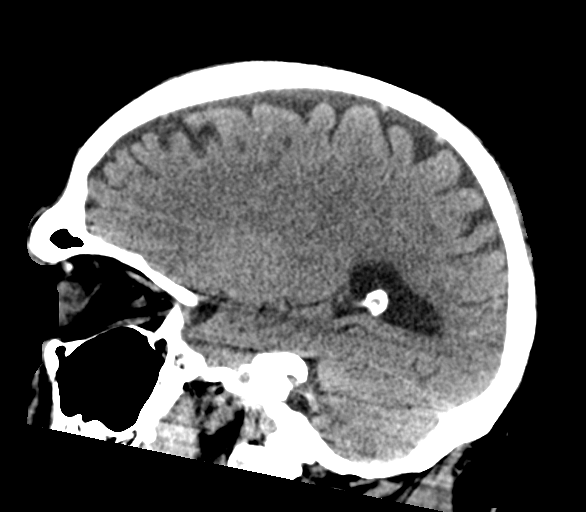

[16 of 47 positions shown; findings below may reference images not displayed]

FINDINGS: Brain: There is no evidence of acute intracranial hemorrhage, mass
lesion, brain edema or extra-axial fluid collection. Stable mild
generalized atrophy. There is no CT evidence of acute cortical
infarction.

Vascular: Intracranial vascular calcifications are present.

Skull: Negative for fracture or focal lesion.

Sinuses/Orbits: Patient is intubated. There is mild mucosal
thickening in the ethmoid sinuses. There is a small amount of fluid
dependently in the sphenoid sinus. The frontal and maxillary sinuses
are clear. The mastoid air cells and middle ears are clear. No
orbital abnormalities are seen.

Other: None.
IMPRESSION: 1. No acute intracranial findings.
2. Mild paranasal sinus mucosal thickening and fluid attributed to
intubation.

## 2017-04-22 IMAGING — CR DG CHEST 1V PORT
1 series · 1 of 1 positions shown · non-contrast
Comparison: April 12, 2016

CLINICAL DATA: ETT.

EXAM:
PORTABLE CHEST 1 VIEW

[AP]
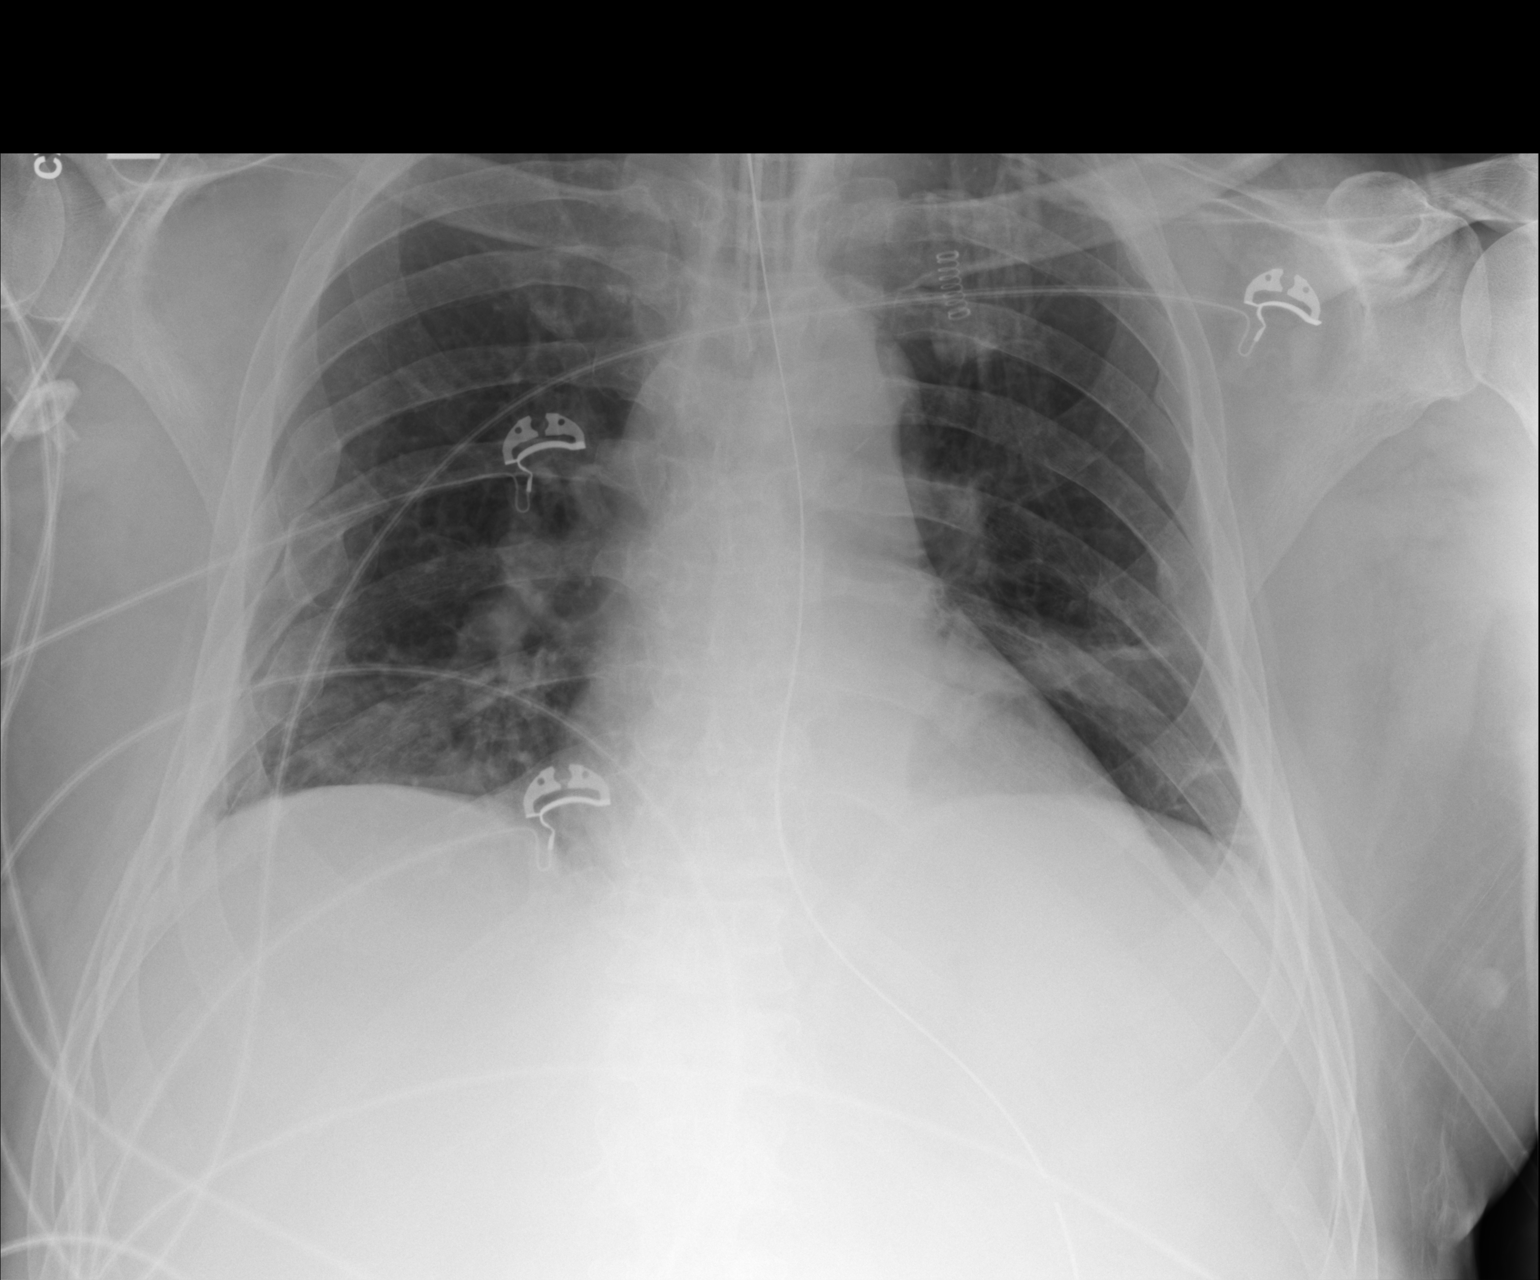

[1 of 1 positions shown; findings below may reference images not displayed]

FINDINGS: The ETT and NG tube remain, in good position. No pneumothorax. The
opacity in the left lung base persists but is improved. No other
interval changes.
IMPRESSION: Stable support apparatus.  Improving left basilar opacity.

## 2017-07-07 ENCOUNTER — Telehealth: Payer: Self-pay | Admitting: *Deleted

## 2017-07-07 NOTE — Telephone Encounter (Signed)
Patient called back & I r/s him for Friday 2/22 @ 12:00 with an arrival time of 11:30. He verbalized appreciation.

## 2017-07-07 NOTE — Telephone Encounter (Signed)
Called patient and LVM informing patient that appt scheduled for tomorrow Tuesday 2/5 @ 2:00 will need to be r/s d/t MD being out of office unexpectedly. Left office number for patient to call office & r/s.

## 2017-07-08 ENCOUNTER — Ambulatory Visit: Payer: 59 | Admitting: Neurology

## 2017-07-24 ENCOUNTER — Telehealth: Payer: Self-pay | Admitting: Neurology

## 2017-07-24 NOTE — Telephone Encounter (Signed)
Bethany: Please cancel patient's appointment tomorrow. I asked him if we could reschedule. He is doing really well. Please block that appointment time so no one else gets scheduled.   Jael: Do you mind calling and rescheduling him? Give him an hour appointment. But he doesn't have to reschedule for 4-6 months he is doing well doesn't need to be seen sooner.  Thanks

## 2017-07-24 NOTE — Telephone Encounter (Signed)
Note pt is scheduled for 09/02/17 with Dr. Lucia GaskinsAhern.

## 2017-07-25 ENCOUNTER — Ambulatory Visit: Payer: Self-pay | Admitting: Neurology

## 2017-07-29 NOTE — Telephone Encounter (Signed)
Pt. Has been r/s for 11/25/17 Tues. @11am 

## 2017-09-02 ENCOUNTER — Ambulatory Visit: Payer: Self-pay | Admitting: Neurology

## 2017-10-22 ENCOUNTER — Telehealth: Payer: Self-pay | Admitting: Cardiology

## 2017-10-22 NOTE — Telephone Encounter (Signed)
Received records from [place] O. Nicki Reaper, M.D., P.A. Internal Medicine on 10/22/17, Appt 12/18/17 @ 9:40AM. [place]

## 2017-11-25 ENCOUNTER — Encounter: Payer: Self-pay | Admitting: Neurology

## 2017-11-25 ENCOUNTER — Other Ambulatory Visit: Payer: Self-pay | Admitting: Neurology

## 2017-11-25 ENCOUNTER — Ambulatory Visit: Payer: BLUE CROSS/BLUE SHIELD | Admitting: Neurology

## 2017-11-25 DIAGNOSIS — M5417 Radiculopathy, lumbosacral region: Secondary | ICD-10-CM | POA: Diagnosis not present

## 2017-11-25 DIAGNOSIS — E519 Thiamine deficiency, unspecified: Secondary | ICD-10-CM | POA: Diagnosis not present

## 2017-11-25 DIAGNOSIS — R252 Cramp and spasm: Secondary | ICD-10-CM | POA: Diagnosis not present

## 2017-11-25 DIAGNOSIS — R0683 Snoring: Secondary | ICD-10-CM

## 2017-11-25 DIAGNOSIS — G4719 Other hypersomnia: Secondary | ICD-10-CM | POA: Diagnosis not present

## 2017-11-25 DIAGNOSIS — R5382 Chronic fatigue, unspecified: Secondary | ICD-10-CM

## 2017-11-25 DIAGNOSIS — E538 Deficiency of other specified B group vitamins: Secondary | ICD-10-CM | POA: Diagnosis not present

## 2017-11-25 MED ORDER — TRAZODONE HCL 100 MG PO TABS: 100.0000 mg | ORAL_TABLET | Freq: Every evening | ORAL | 11 refills | Status: DC | PRN

## 2017-11-25 MED ORDER — GABAPENTIN 600 MG PO TABS: ORAL_TABLET | ORAL | 11 refills | Status: DC

## 2017-11-25 MED ORDER — LIDOCAINE 5 % EX PTCH: 2.0000 | MEDICATED_PATCH | CUTANEOUS | 11 refills | Status: DC

## 2017-11-25 NOTE — Progress Notes (Signed)
Epworth Sleepiness Scale 0= would never doze 1= slight chance of dozing 2= moderate chance of dozing 3= high chance of dozing  Sitting and reading: 3 Watching TV: 2 Sitting inactive in a public place (ex. Theater or meeting): 2 As a passenger in a car for an hour without a break: 1 Lying down to rest in the afternoon: 3 Sitting and talking to someone: 0 Sitting quietly after lunch (no alcohol): 1 In a car, while stopped in traffic: 1 Total:13

## 2017-11-25 NOTE — Progress Notes (Signed)
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 He is taking multivitamins, folate, a b-complex vitamin. Gabapentin helps. Not interested in PT, no falls, he also has lumbar radic. For obesity, healthy weight wellness. He gets up 4-5 times a night to urinate. He snores excessively, he nods off a lot.  ESS 13 will order sleep evaluation.  He also gets cramps a lot, may be the neuropathy. Has had low magnesium, potassium. Discussed obesity, OSA risks and benefits of treatment, he is going to get back to working out.   Interval history 01/02/2017: 62 year old with alcoholic neuropathy here for follow-up. Short term memory is fine, more long-term memory he can't remember a lot from a year ago. He exercises daily for an hour. Feet are more numb but also painful they hurt in the afternoon, mornings are pretty good, worse with walking. He is taking multivitamins, folate, a b-complex vitamin. Gabapentin helps. No seizures. He is not interested in PT.No falls. He has a lot of back pain, weakness in the legs, shooting pain from his back to his feet. Worse with sitting too long. Has to use his hands to stand up. Back hurts every day, aching, tight.   Interval history 07/03/2016: Patient here for followup of alcholic neuropathy. Recently hospitalized for alcohol withdrawal seizures and discharged to [facility] 04/18/2016. 85 days sober. Went to inpatient therapy. He is going to AA. He has a great sponsor who has been sober 6 years. Brother is here and says things are wonderful. Feels his memory is better. He is having pain in the big toes, throbbin like they want to rot off the rest of the feet are numb. Legs throb at night worse at night. During the day he is fine. He is taking the  gabapentin 3x a day 600mg . Increase gabapentin to 1200 a night. Also PT.  Interval history 02/22/2016: Knee has been hurting for 8 weeks with swelling. A cortisone shot did not help. Xrays showed some arthritis. Patient says he fell and the right knee bent all the way back. Since then swelling and pain. He broke his ankle 7 weeks ago alsoafter a fall he tripped over the threhold and also injured his knee. He decided to retire. He stopped drinking. He was given lorazepam for 3 days. Knee brace is helping. His feet are mostly numb. He does have some pain on the bottom of his feet. His big toes hurt a lot. The right leg is swollen below the knee and he has pain in his calf. [place] 15 hours straight without ambulating. Will refer to physical therapy for gait abnormality, u/s to eval for DVT and referral to [facility].  Interval history 08/24/2015: His feet are mostly numb, they don't really hurt. He has checked circulation. His gralise is at 1200mg . He increased about 2 months ago. The Gralise is stil working for him. He sleeps well. No significant pain in the feet. He continuous alcohol. He drinks bourbon and water at night. He goes through a half gallon of bourbon a week as well as beer. Discussed alcohol as a nerve toxin, patient acknowledges and understands the sequelae of alcoholism as far as his neuropathy is concerned.  HPI: Tony Patterson is  a 62 y.o. male here as a referral from Dr. [name] for peripheral neuropathy. PMHx of long-standing continous alcohol abuse.   Feet have been numb for a year. Progressing. Also tingling. Not burning. Mainly in the toes and balls of feet. Worse when working or on feet. Has LBP and radicular symptoms. And cramping in the toes at night. Balance is worsening. Hasn't taken any medication to try and help with the symptoms. Getting off his feet makes the pain better. Severe and symptoms can get up to 8/10 in pain. No Fhx  of neuromuscular disorders or CMT or neuropathy. Denies ever taking medications for chemotherapy, no medications for gout, no isoniazid, flagyl or any new medications at onset. No inciting factors. He has 3 drinks every weekday of bourbon and a pint daily on the weekends. He is falling, tripping. He rolls his ankles. Had a seizure one year ago and was taken to [facility], may have been alcohol related. No episodes for a year since taken to [facility] per patient.   Reviewed notes, labs and imaging from outside physicians, which showed: PMHx of CKD, Depression, HLD, HTN and alcohol abuse with continuous drinking behavior. Dr. [name] notes continuous alcholic consumption with elevated liver enzymes, drinks bourbon, HTN and HLD well controlled on metoprolol and statin. Last CBC and CMP significant for ALT 58 and AST 148 with a GFR of 81. Personally reviewed CT of the head from 11/2011, no large ischemic events, masses or tumors some questionably increased atrophy at the cerebellar vermis more than stated age.   EMG/NCS 03/2014: There is electrophysiologic evidence of a symmetric length-dependent severe axonal sensorimotor polyneuropathy.   Labs: TSH, HIV, ANA, IFE, RPR, heavy metals, B12, MMA, B1, all unremarkable in 2015    Social History   Socioeconomic History  . Marital status: Divorced    Spouse name: Not on file  . Number of children: 0  . Years of education: college 1  . Highest education level: Not on file  Occupational History  . Occupation: Writer: [employer]    Comment: Neurosurgeon  Social Needs  . Financial resource strain: Not on file  . Food insecurity:    Worry: Not on file    Inability: Not on file  . Transportation needs:    Medical: Not on file    Non-medical: Not on file  Tobacco Use  . Smoking status: Former Smoker    Packs/day: 0.50    Years: 20.00    Pack years: 10.00    Types: Cigarettes    Last attempt to quit: 06/03/1995     Years since quitting: 22.4  . Smokeless tobacco: Never Used  Substance and Sexual Activity  . Alcohol use: Yes    Comment: Previous alcohol abuse; drank 3 weeks ago  . Drug use: No  . Sexual activity: Never    Birth control/protection: None  Lifestyle  . Physical activity:    Days per week: Not on file    Minutes per session: Not on file  . Stress: Not on file  Relationships  . Social connections:    Talks on phone: Not on file    Gets together: Not on file    Attends religious service: Not on file    Active member of club or organization: Not on file    Attends meetings of clubs or organizations: Not on file    Relationship status: Not on file  . Intimate partner violence:    Fear of  current or ex partner: Not on file    Emotionally abused: Not on file    Physically abused: Not on file    Forced sexual activity: Not on file  Other Topics Concern  . Not on file  Social History Narrative   Left-handed   Caffeine use: 1 large cup of coffee in the morning, Coke (caffeine free) ocass    Family History  Problem Relation Age of Onset  . Fibromyalgia Mother   . Dementia Mother   . Congestive Heart Failure Mother   . Congestive Heart Failure Father   . Heart Problems Father   . Hypertension Brother   . Hypertension Brother   . Neuropathy Neg Hx   . Colon cancer Neg Hx     Past Medical History:  Diagnosis Date  . Alcohol abuse   . Anemia   . Anxiety   . Hypertension   . Neuropathy   . Seizures (HCC)    unknown etiology and on no meds; been 4 years since seizure.    Past Surgical History:  Procedure Laterality Date  . COLONOSCOPY  05/18/2007   XBJ:[id number] hemorrhoids, a single anal papilla, otherwise normal/ Single polyp, as described above, removed   . COLONOSCOPY WITH PROPOFOL N/A 03/16/2015   Procedure: COLONOSCOPY WITH PROPOFOL;  Surgeon: Corbin Ade, MD;  Location: [facility];  Service: Endoscopy;  Laterality: N/A;  in cecum at 1344. withdrawal time   .  THROAT SURGERY     polyps removed x2    Current Outpatient Medications  Medication Sig Dispense Refill  . atorvastatin (LIPITOR) 40 MG tablet Take 40 mg by mouth at bedtime.    Marland Kitchen b complex vitamins capsule Take 1 capsule by mouth daily.    Marland Kitchen BABY ASPIRIN PO Take by mouth daily.    . folic acid (FOLVITE) 1 MG tablet Take 1 tablet (1 mg total) by mouth daily.    Marland Kitchen gabapentin (NEURONTIN) 600 MG tablet 2 tablets (1200 mg) three times a day as needed 360 tablet 11  . magnesium oxide (MAG-OX) 400 MG tablet Take 400 mg by mouth at bedtime.    . metoprolol (LOPRESSOR) 50 MG tablet Take 50 mg by mouth 2 (two) times daily.     . Multiple Vitamin (MULTIVITAMIN WITH MINERALS) TABS tablet Take 1 tablet by mouth daily.    . Naphazoline HCl (CLEAR EYES OP) Place 1 drop into both eyes daily.    Marland Kitchen omeprazole (PRILOSEC) 10 MG capsule Take 10 mg by mouth daily.    . tamsulosin (FLOMAX) 0.4 MG CAPS capsule Take 0.4 mg by mouth daily.     Marland Kitchen lidocaine (LIDODERM) 5 % Place 2 patches onto the skin daily. Remove & Discard patch within 12 hours or as directed by MD 60 patch 11  . traZODone (DESYREL) 100 MG tablet Take 1 tablet (100 mg total) by mouth at bedtime as needed. 30 tablet 11   No current facility-administered medications for this visit.     Allergies as of 11/25/2017 - Review Complete 11/25/2017  Allergen Reaction Noted  . Bee venom Anaphylaxis 10/08/2011  . Penicillins Other (See Comments) 10/08/2011    Vitals: BP (!) 160/82 (BP Location: Right Arm, Patient Position: Sitting) Comment: repeated manual pressure  Pulse 62   Ht 6\' 3"  (1.905 m)   Wt 255 lb (115.7 kg)   BMI 31.87 kg/m  Last Weight:  Wt Readings from Last 1 Encounters:  11/25/17 255 lb (115.7 kg)   Last Height:  Ht Readings from Last 1 Encounters:  11/25/17 6\' 3"  (1.905 m)      Physical exam: Exam: Gen: NAD, conversant, well nourised, overweigh  Eyes: Conjunctivae clear without exudates or  hemorrhage  Neuro: Detailed Neurologic Exam  Speech:  Speech is normal; fluent and spontaneous with normal comprehension.  Cognition:  The patient is oriented to person, place, and time;   Cranial Nerves:  The pupils are equal, round, and reactive to light. Extraocular movements are intact. Trigeminal sensation is intact and the muscles of mastication are normal. The face is symmetric. The palate elevates in the midline. Voice is normal. Shoulder shrug is normal. The tongue has normal motion without fasciculations.   Gait:  Wide based, ataxic, imbalance with tandem  Motor Observation:  Postural tremor. Atrophy of the distal legs, tapering Tone:  Normal muscle tone.   Posture:  Posture is normal. normal erect   Strength: right hip flexion 3+/5, left 4/%, right biceps femoris 4/5 , right DF 5-/5.    Sensation:   Decreased cold and pp to below knees and below elbows Absent vibaration at great toes, 8 seconds at medial malleoli proprioception intact  DTRs: Absent AJs   Assessment/Plan: 62year old male with a PMHx of long-standing continous alcohol abuse(recently stopped 9 months sober)who is here for follow up of alcoholic peripheral neuropathy. Exam significant for absent acilles DTRs, atrophy of the distal legs and tapering, decreased cold and pp to below knees and below wrists, absent vibaration at great toes. 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 EMG/NCS with severe axonal polyneuropathy.   - Patient snores and his nephew who is present today reports gasping for air and apneic events while he is sleeping. He is very tired, ESS is 15. Sleep evaluation. - b12 deficiency: thiamine deficiency: continue supplement check labs - Neuropathy:continue  gabapentin  - Cramps: check magnesium and potassium - Fatigue: check cbc, cmp, likely OSA needs sleep eval - chronic low back pain with right leg weakness on exam: Has right L5 radic confirmed on MRI 01/2017,  he was seen by [facility]. Went to PT. Feels a little better. Recommend right L5 ESI Dr Ethelene Hal for [facility] - Obesity: healthy [facility] - insomnia Trazodone prn - Lidoderm for low back pain  Orders Placed This Encounter  Procedures  . Magnesium  . Comprehensive metabolic panel  . CBC  . B12 and Folate Panel  . Methylmalonic acid, serum  . Vitamin B1  . Ambulatory referral to [facility]  . Ambulatory referral to Sleep Studies  . Ambulatory referral to Orthopedic Surgery   Refill meds  Meds ordered this encounter  Medications  . gabapentin (NEURONTIN) 600 MG tablet    Sig: 2 tablets (1200 mg) three times a day as needed    Dispense:  360 tablet    Refill:  11  . lidocaine (LIDODERM) 5 %    Sig: Place 2 patches onto the skin daily. Remove & Discard patch within 12 hours or as directed by MD    Dispense:  60 patch    Refill:  11  . traZODone (DESYREL) 100 MG tablet    Sig: Take 1 tablet (100 mg total) by mouth at bedtime as needed.    Dispense:  30 tablet    Refill:  11   Naomie Dean, MD  [facility] [address] [place], [place] [address]  Phone [phone] Fax [phone]  A total of 40 minutes was spent face-to-face with this patient.  Over half this time was spent on counseling patient on the chronic low back pain and lumbar radiculopathy, leg weakness, alcoholic neuropathy, snoring and witnessed apneic events,obesity, fatigue, cramps diagnosis and different diagnostic and therapeutic options available.

## 2017-11-25 NOTE — Patient Instructions (Addendum)
- Patient Tony Patterson and Tony Patterson who is present today reports gasping for air and apneic events while he is sleeping. He is very tired, ESS is 15. Sleep evaluation. - b12 deficiency: thiamine deficiency: continue supplement check labs - Neuropathy: continue gabapentin  - Cramps: check magnesium and potassium - Fatigue: check cbc, cmp, likely OSA needs sleep eval - chronic low back pain with right leg weakness on exam: Has right L5 radic confirmed on MRI 01/2017, he was seen by [facility]. Went to PT. Feels a little better. Recommend right L5 [facility] - Obesity: healthy Weight and Well[facility]   Sleep Apnea Sleep apnea is a condition in which breathing pauses or becomes shallow during sleep. Episodes of sleep apnea usually last 10 seconds or longer, and they may occur as many as 20 times an hour. Sleep apnea disrupts your sleep and keeps your body from getting the rest that it needs. This condition can increase your risk of certain health problems, including:  Heart attack.  Stroke.  Obesity.  Diabetes.  Heart failure.  Irregular heartbeat.  There are three kinds of sleep apnea:  Obstructive sleep apnea. This kind is caused by a blocked or collapsed airway.  Central sleep apnea. This kind happens when the part of the brain that controls breathing does not send the correct signals to the muscles that control breathing.  Mixed sleep apnea. This is a combination of obstructive and central sleep apnea.  What are the causes? The most common cause of this condition is a collapsed or blocked airway. An airway can collapse or become blocked if:  Your throat muscles are abnormally relaxed.  Your tongue and tonsils are larger than normal.  You are overweight.  Your airway is smaller than normal.  What increases the risk? This condition is more likely to develop in people who:  Are overweight.  Smoke.  Have a smaller than normal  airway.  Are elderly.  Are male.  Drink alcohol.  Take sedatives or tranquilizers.  Have a family history of sleep apnea.  What are the signs or symptoms? Symptoms of this condition include:  Trouble staying asleep.  Daytime sleepiness and tiredness.  Irritability.  Loud snoring.  Morning headaches.  Trouble concentrating.  Forgetfulness.  Decreased interest in sex.  Unexplained sleepiness.  Mood swings.  Personality changes.  Feelings of depression.  Waking up often during the night to urinate.  Dry mouth.  Sore throat.  How is this diagnosed? This condition may be diagnosed with:  A medical history.  A physical exam.  A series of tests that are done while you are sleeping (sleep study). These tests are usually done in a sleep lab, but they may also be done at home.  How is this treated? Treatment for this condition aims to restore normal breathing and to ease symptoms during sleep. It may involve managing health issues that can affect breathing, such as high blood pressure or obesity. Treatment may include:  Sleeping on your side.  Using a decongestant if you have nasal congestion.  Avoiding the use of depressants, including alcohol, sedatives, and narcotics.  Losing weight if you are overweight.  Making changes to your diet.  Quitting smoking.  Using a device to open your airway while you sleep, such as: ? An oral appliance. This is a custom-made mouthpiece that shifts your lower jaw forward. ? A continuous positive airway pressure (CPAP) device. This device delivers oxygen to your airway  through a mask. ? A nasal expiratory positive airway pressure (EPAP) device. This device has valves that you put into each nostril. ? A bi-level positive airway pressure (BPAP) device. This device delivers oxygen to your airway through a mask.  Surgery if other treatments do not work. During surgery, excess tissue is removed to create a wider  airway.  It is important to get treatment for sleep apnea. Without treatment, this condition can lead to:  High blood pressure.  Coronary artery disease.  (Men) An inability to achieve or maintain an erection (impotence).  Reduced thinking abilities.  Follow these instructions at home:  Make any lifestyle changes that your health care provider recommends.  Eat a healthy, well-balanced diet.  Take over-the-counter and prescription medicines only as told by your health care provider.  Avoid using depressants, including alcohol, sedatives, and narcotics.  Take steps to lose weight if you are overweight.  If you were given a device to open your airway while you sleep, use it only as told by your health care provider.  Do not use any tobacco products, such as cigarettes, chewing tobacco, and e-cigarettes. If you need help quitting, ask your health care provider.  Keep all follow-up visits as told by your health care provider. This is important. Contact a health care provider if:  The device that you received to open your airway during sleep is uncomfortable or does not seem to be working.  Your symptoms do not improve.  Your symptoms get worse. Get help right away if:  You develop chest pain.  You develop shortness of breath.  You develop discomfort in your back, arms, or stomach.  You have trouble speaking.  You have weakness on one side of your body.  You have drooping in your face. These symptoms may represent a serious problem that is an emergency. Do not wait to see if the symptoms will go away. Get medical help right away. Call your local emergency services (911 in the [place] Do not drive yourself to the hospital. This information is not intended to replace advice given to you by your health care provider. Make sure you discuss any questions you have with your health care provider. Document Released: 05/10/2002 Document Revised: 01/14/2016 Document Reviewed:  02/27/2015 Elsevier Interactive Patient Education  [employer].

## 2017-11-27 ENCOUNTER — Telehealth: Payer: Self-pay

## 2017-11-27 NOTE — Telephone Encounter (Signed)
Completed pa for lidoderm patches for lumbar radiculopathy[facility] Should have a determination in 3-5 business days.

## 2017-11-28 ENCOUNTER — Telehealth: Payer: Self-pay | Admitting: *Deleted

## 2017-11-28 LAB — METHYLMALONIC ACID, SERUM: Methylmalonic Acid: 223 nmol/L (ref 0–378)

## 2017-11-28 LAB — COMPREHENSIVE METABOLIC PANEL
A/G RATIO: 1.9 (ref 1.2–2.2)
ALT: 18 IU/L (ref 0–44)
AST: 16 IU/L (ref 0–40)
Albumin: 4.1 g/dL (ref 3.6–4.8)
Alkaline Phosphatase: 97 IU/L (ref 39–117)
BILIRUBIN TOTAL: 0.3 mg/dL (ref 0.0–1.2)
BUN/Creatinine Ratio: 15 (ref 10–24)
BUN: 22 mg/dL (ref 8–27)
CALCIUM: 9.1 mg/dL (ref 8.6–10.2)
CHLORIDE: 105 mmol/L (ref 96–106)
CO2: 21 mmol/L (ref 20–29)
Creatinine, Ser: 1.42 mg/dL — ABNORMAL HIGH (ref 0.76–1.27)
GFR calc Af Amer: 61 mL/min/{1.73_m2} (ref >59)
GFR calc non Af Amer: 53 mL/min/{1.73_m2} — ABNORMAL LOW (ref >59)
GLUCOSE: 102 mg/dL — AB (ref 65–99)
Globulin, Total: 2.2 g/dL (ref 1.5–4.5)
POTASSIUM: 5.2 mmol/L (ref 3.5–5.2)
Sodium: 139 mmol/L (ref 134–144)
Total Protein: 6.3 g/dL (ref 6.0–8.5)

## 2017-11-28 LAB — CBC
HEMATOCRIT: 40.5 % (ref 37.5–51.0)
HEMOGLOBIN: 12.5 g/dL — AB (ref 13.0–17.7)
MCH: 28 pg (ref 26.6–33.0)
MCHC: 30.9 g/dL — ABNORMAL LOW (ref 31.5–35.7)
MCV: 91 fL (ref 79–97)
Platelets: 242 10*3/uL (ref 150–450)
RBC: 4.47 x10E6/uL (ref 4.14–5.80)
RDW: 16.2 % — ABNORMAL HIGH (ref 12.3–15.4)
WBC: 6.4 10*3/uL (ref 3.4–10.8)

## 2017-11-28 LAB — B12 AND FOLATE PANEL: VITAMIN B 12: 1842 pg/mL — AB (ref 232–1245)

## 2017-11-28 LAB — VITAMIN B1: THIAMINE: 380.3 nmol/L — AB (ref 66.5–200.0)

## 2017-11-28 LAB — MAGNESIUM: Magnesium: 1.5 mg/dL — ABNORMAL LOW (ref 1.6–2.3)

## 2017-11-28 NOTE — Telephone Encounter (Signed)
-----   Message from Anson FretAntonia B Ahern, MD sent at 11/28/2017  8:45 AM EDT ----- His magnesium is very low. This is likely why he is having cramps. I'd buy some magnesium supplements over the counter. thanks

## 2017-11-28 NOTE — Telephone Encounter (Signed)
Called pt & LVM asking for call back.  

## 2017-12-02 NOTE — Telephone Encounter (Signed)
Pt's lidocaine patches were denied. I called pt to discuss, no answer, left a message asking him to call me back. I also called to discuss pt's lab work from 11/27/17.

## 2017-12-08 NOTE — Telephone Encounter (Signed)
Called pt & LVM asking for call back.  

## 2017-12-08 NOTE — Telephone Encounter (Signed)
If he wants the patched he will have to pay out of pocket unfortunately. Looks like we have left him a few messages, can wait to hear back from him thanks

## 2017-12-08 NOTE — Telephone Encounter (Signed)
Note in other phone call pt has the patches and will be pay out of pocket for more.

## 2017-12-08 NOTE — Telephone Encounter (Signed)
I would increase his magnesium to  twice daily stop for diarrhea or any side effects

## 2017-12-08 NOTE — Telephone Encounter (Signed)
Pt returned call. Discussed his Magnesium is low at 1.5 and should be between 1.6-2.3. This is likely why he is cramping. He stated he has been taking Magnesium oxide 400 mg daily and has been on this for a "pretty good while", probably a year. He stated that he knew about the lidocaine patch denial and he is going to pay cash for them. He thinks he has paid cash for them in the past and says he only uses it about once a week, still has some left and is "in good shape". He is currently on the wait list to see Dr. [facility]bert [facility] and will call them to get scheduled. RN gave the number to pt. He was encouraged to keep taking his Magnesium and we will call him back if anything needs to change. He verbalized appreciation.

## 2017-12-08 NOTE — Telephone Encounter (Signed)
Called pt and LVM (on home #) asking for call back. Also attempted cell phone and LVM (ok per DPR) informing pt of the low magnesium and that this is likely why he is having cramps. Dr. Lucia GaskinsAhern recommends some over the counter magnesium supplements. Also included in message that insurance denied his Lidocaine patches. He can get a coupon from [employer] but may not be helpful enough for price of medication. Informed pt RN will be sending message to Dr. Lucia GaskinsAhern regarding the patch denial. Asked for call back to confirm receipt of message and discuss. Left office number.

## 2017-12-09 NOTE — Telephone Encounter (Signed)
Spoke with pt. Advised him that Dr. Lucia GaskinsAhern said he can go ahead and increase his magnesium to twice daily and stop for diarrhea or any side effects. Pt verbalized understanding. He stated he has diarrhea about once a week and was told he had his "mother's stomach". RN advised he could see PCP for this since he denied being evaluated in the past. If he has any increased diarrhea or any side effects from the increase magnesium, stop it. He verbalized appreciation and understanding.

## 2017-12-12 ENCOUNTER — Telehealth: Payer: Self-pay | Admitting: Cardiology

## 2017-12-12 NOTE — Telephone Encounter (Signed)
Received records from [facility] On 12/12/17, Appt 12/18/17 @ 9:40AM. [place]

## 2017-12-16 NOTE — Progress Notes (Signed)
Cardiology Office Note   Date:  12/18/2017   ID:  Tony Patterson, DOB 03-Sep-1955, MRN [id number]  PCP:  Tony PerchesFagan, Roy, MD  Cardiologist:   No primary care provider on file. Referring:  Tony PerchesFagan, Roy, MD  Chief Complaint  Patient presents with  . Hyperlipidemia      History of Present Illness: Tony PorterRobert Patterson is a 62 y.o. male who is referred by Tony PerchesFagan, Roy, MD for evaluation of evaluation of multiple cardiovascular risk factors.  The patient recently had 2 brothers diagnosed with early onset coronary disease.  His father had early onset coronary disease.  He has not had risk stratification previously.  He has significant cardiovascular risk factors.  He exercised a little bit previously but otherwise is relatively sedentary.  He can do household chores like you to the mailbox and dragged the garbage cans out to the curb.  With this he denies any cardiovascular symptoms.  The patient denies any new symptoms such as chest discomfort, neck or arm discomfort. There has been no new shortness of breath, PND or orthopnea. There have been no reported palpitations, presyncope or syncope.   Past Medical History:  Diagnosis Date  . Alcohol abuse   . Anemia   . Anxiety   . Hypertension   . Neuropathy   . Seizures (HCC)    unknown etiology and on no meds; been 4 years since seizure.    Past Surgical History:  Procedure Laterality Date  . COLONOSCOPY  05/18/2007   WUJ:[id number]:Internal hemorrhoids, a single anal papilla, otherwise normal/ Single polyp, as described above, removed   . COLONOSCOPY WITH PROPOFOL N/A 03/16/2015   Procedure: COLONOSCOPY WITH PROPOFOL;  Surgeon: Corbin Adeobert M Rourk, MD;  Location: [facility];  Service: Endoscopy;  Laterality: N/A;  in cecum at 1344. withdrawal time  8min  . THROAT SURGERY     polyps removed x2     Current Outpatient Medications  Medication Sig Dispense Refill  . aspirin EC 81 MG tablet Take 81 mg by mouth daily.    Marland Kitchen. atorvastatin (LIPITOR) 40 MG tablet Take 40 mg by  mouth at bedtime.    Marland Kitchen. b complex vitamins capsule Take 1 capsule by mouth daily.    . folic acid (FOLVITE) 1 MG tablet Take 1 tablet (1 mg total) by mouth daily.    Marland Kitchen. gabapentin (NEURONTIN) 600 MG tablet 2 tablets (1200 mg) three times a day as needed 360 tablet 11  . lidocaine (LIDODERM) 5 % Place 2 patches onto the skin daily. Remove & Discard patch within 12 hours or as directed by MD 60 patch 11  . magnesium oxide (MAG-OX) 400 MG tablet Take 400 mg by mouth at bedtime.    . metoprolol (LOPRESSOR) 50 MG tablet Take 50 mg by mouth 2 (two) times daily.     . Multiple Vitamin (MULTIVITAMIN WITH MINERALS) TABS tablet Take 1 tablet by mouth daily.    . Naphazoline HCl (CLEAR EYES OP) Place 1 drop into both eyes daily.    Marland Kitchen. omeprazole (PRILOSEC) 20 MG capsule Take 20 mg by mouth daily.     . tamsulosin (FLOMAX) 0.4 MG CAPS capsule Take 0.4 mg by mouth daily.     . traZODone (DESYREL) 100 MG tablet Take 1 tablet (100 mg total) by mouth at bedtime as needed. 30 tablet 11   No current facility-administered medications for this visit.     Allergies:   Bee venom and Penicillins    Social History:  The patient  reports that he quit smoking about 22 years ago. His smoking use included cigarettes. He has a 10.00 pack-year smoking history. He has never used smokeless tobacco. He reports that he drinks alcohol. He reports that he does not use drugs.   Family History:  The patient's family history includes CAD (age of onset: 64) in his brother; CAD (age of onset: 27) in his brother; Congestive Heart Failure in his father and mother; Coronary artery disease (age of onset: 70) in his father; Dementia in his mother; Fibromyalgia in his mother; Hypertension in his brother and brother.    ROS:  Please see the history of present illness.   Otherwise, review of systems are positive for neuroapthy.   All other systems are reviewed and negative.    PHYSICAL EXAM: VS:  BP (!) 144/98 (BP Location: Left Arm,  Patient Position: Sitting, Cuff Size: Large)   Pulse 65   Ht 6\' 3"  (1.905 m)   Wt 255 lb (115.7 kg)   BMI 31.87 kg/m  , BMI Body mass index is 31.87 kg/m. GENERAL:  Well appearing HEENT:  Pupils equal round and reactive, fundi not visualized, oral mucosa unremarkable NECK:  No jugular venous distention, waveform within normal limits, carotid upstroke brisk and symmetric, no bruits, no thyromegaly LYMPHATICS:  No cervical, inguinal adenopathy LUNGS:  Clear to auscultation bilaterally BACK:  No CVA tenderness CHEST:  Unremarkable HEART:  PMI not displaced or sustained,S1 and S2 within normal limits, no S3, no S4, no clicks, no rubs, no murmurs ABD:  Flat, positive bowel sounds normal in frequency in pitch, no bruits, no rebound, no guarding, no midline pulsatile mass, no hepatomegaly, no splenomegaly. obese EXT:  2 plus pulses upper and decreased DP/PT , no edema, no cyanosis no clubbing SKIN:  No rashes no nodules NEURO:  Cranial nerves II through XII grossly intact, motor grossly intact throughout PSYCH:  Cognitively intact, oriented to person place and time    EKG:  EKG is ordered today. The ekg ordered today demonstrates sinus rhythm, rate 65, axis within normal limits, intervals within normal limits, no acute ST-T wave changes.   Recent Labs: 11/25/2017: ALT 18; BUN 22; Creatinine, Ser 1.42; Hemoglobin 12.5; Magnesium 1.5; Platelets 242; Potassium 5.2; Sodium 139    Lipid Panel    Component Value Date/Time   TRIG 150 (H) 04/12/2016 1547      Wt Readings from Last 3 Encounters:  12/18/17 255 lb (115.7 kg)  11/25/17 255 lb (115.7 kg)  01/02/17 238 lb 3.2 oz (108 kg)      Other studies Reviewed: Additional studies/ records that were reviewed today include: labs. Review of the above records demonstrates:  Please see elsewhere in the note.     ASSESSMENT AND PLAN:  RISK STRATIFICATION:   The patient has a significant family history of multiple cardiovascular risk  factors.  I will start screening with a coronary calcium score.   OBESITY:   we had a long discussion about diet and exercise to include Mediterranean diet.  ETOH USE:   It is suggested that he completely abstain.   DYSLIPIDEMIA:  LDL was 69.  He will remain on meds as listed.    HTN:   His blood pressure is elevated today.  He will keep a BP diary.  Changes to meds will be based on this.     Current medicines are reviewed at length with the patient today.  The patient does not have concerns regarding medicines.  The following changes have  been made:  no change  Labs/ tests ordered today include:   Orders Placed This Encounter  Procedures  . CT CARDIAC SCORING  . EKG 12-Lead     Disposition:   FU with me as needed based on the results of the caclium score.     Signed, Rollene Rotunda, MD  12/18/2017 10:32 AM    [facility]

## 2017-12-18 ENCOUNTER — Ambulatory Visit: Payer: BLUE CROSS/BLUE SHIELD | Admitting: Cardiology

## 2017-12-18 ENCOUNTER — Encounter: Payer: Self-pay | Admitting: Cardiology

## 2017-12-18 VITALS — BP 144/98 | HR 65 | Ht 75.0 in | Wt 255.0 lb

## 2017-12-18 DIAGNOSIS — I10 Essential (primary) hypertension: Secondary | ICD-10-CM | POA: Diagnosis not present

## 2017-12-18 DIAGNOSIS — E785 Hyperlipidemia, unspecified: Secondary | ICD-10-CM | POA: Diagnosis not present

## 2017-12-18 DIAGNOSIS — R0602 Shortness of breath: Secondary | ICD-10-CM | POA: Diagnosis not present

## 2017-12-18 NOTE — Patient Instructions (Signed)
Medication Instructions:  Continue current medications  If you need a refill on your cardiac medications before your next appointment, please call your pharmacy.  Labwork: None Ordered   Testing/Procedures: Your physician has requested that you have a Coronary Calcium Score Test. This test is done at our [address] Office.   Follow-Up: Your physician wants you to follow-up in: As Needed.      Thank you for choosing [facility]!!       

## 2017-12-29 ENCOUNTER — Ambulatory Visit (INDEPENDENT_AMBULATORY_CARE_PROVIDER_SITE_OTHER): Payer: Self-pay

## 2017-12-29 ENCOUNTER — Encounter (INDEPENDENT_AMBULATORY_CARE_PROVIDER_SITE_OTHER): Payer: Self-pay | Admitting: Physical Medicine and Rehabilitation

## 2017-12-29 ENCOUNTER — Ambulatory Visit (INDEPENDENT_AMBULATORY_CARE_PROVIDER_SITE_OTHER): Payer: BLUE CROSS/BLUE SHIELD | Admitting: Physical Medicine and Rehabilitation

## 2017-12-29 VITALS — BP 134/95 | HR 64

## 2017-12-29 DIAGNOSIS — G621 Alcoholic polyneuropathy: Secondary | ICD-10-CM

## 2017-12-29 DIAGNOSIS — M5116 Intervertebral disc disorders with radiculopathy, lumbar region: Secondary | ICD-10-CM | POA: Diagnosis not present

## 2017-12-29 DIAGNOSIS — G8929 Other chronic pain: Secondary | ICD-10-CM

## 2017-12-29 DIAGNOSIS — M5441 Lumbago with sciatica, right side: Secondary | ICD-10-CM

## 2017-12-29 MED ORDER — BETAMETHASONE SOD PHOS & ACET 6 (3-3) MG/ML IJ SUSP: 12.0000 mg | Freq: Once | INTRAMUSCULAR | Status: AC

## 2017-12-29 NOTE — Patient Instructions (Signed)

## 2017-12-29 NOTE — Progress Notes (Signed)
 .  Numeric Pain Rating Scale and Functional Assessment Average Pain 8   In the last MONTH (on 0-10 scale) has pain interfered with the following?  1. General activity like being  able to carry out your everyday physical activities such as walking, climbing stairs, carrying groceries, or moving a chair?  Rating(5)   +Driver, -BT, -Dye Allergies.  

## 2017-12-30 ENCOUNTER — Ambulatory Visit (INDEPENDENT_AMBULATORY_CARE_PROVIDER_SITE_OTHER)
Admission: RE | Admit: 2017-12-30 | Discharge: 2017-12-30 | Disposition: A | Discharge status: Home / Self Care | Payer: BLUE CROSS/BLUE SHIELD | Source: Ambulatory Visit | Attending: Cardiology | Admitting: Cardiology

## 2017-12-30 DIAGNOSIS — R0602 Shortness of breath: Secondary | ICD-10-CM

## 2017-12-30 NOTE — Procedures (Signed)
Lumbosacral Transforaminal Epidural Steroid Injection - Sub-Pedicular Approach with Fluoroscopic Guidance  Patient: Tony PorterRobert Patterson      Date of Birth: [birth year] MRN: [id number] PCP: Tony PerchesFagan, Roy, MD      Visit Date: 12/29/2017   Universal Protocol:    Date/Time: 12/29/2017  Consent Given By: the patient  Position: PRONE  Additional Comments: Vital signs were monitored before and after the procedure. Patient was prepped and draped in the usual sterile fashion. The correct patient, procedure, and site was verified.   Injection Procedure Details:  Procedure Site One Meds Administered:  Meds ordered this encounter  Medications  . betamethasone acetate-betamethasone sodium phosphate (CELESTONE) injection 12 mg    Laterality: Right  Location/Site:  L5-S1  Needle size: 22 G  Needle type: Spinal  Needle Placement: Transforaminal  Findings:    -Comments: Excellent flow of contrast along the nerve and into the epidural space.  Procedure Details: After squaring off the end-plates to get a true AP view, the C-arm was positioned so that an oblique view of the foramen as noted above was visualized. The target area is just inferior to the "nose of the scotty dog" or sub pedicular. The soft tissues overlying this structure were infiltrated with 2-3 ml. of 1% Lidocaine without Epinephrine.  The spinal needle was inserted toward the target using a "trajectory" view along the fluoroscope beam.  Under AP and lateral visualization, the needle was advanced so it did not puncture dura and was located close the 6 O'Clock position of the pedical in AP tracterory. Biplanar projections were used to confirm position. Aspiration was confirmed to be negative for CSF and/or blood. A 1-2 ml. volume of Isovue-250 was injected and flow of contrast was noted at each level. Radiographs were obtained for documentation purposes.   After attaining the desired flow of contrast documented above, a 0.5 to 1.0 ml  test dose of 0.25% Marcaine was injected into each respective transforaminal space.  The patient was observed for 90 seconds post injection.  After no sensory deficits were reported, and normal lower extremity motor function was noted,   the above injectate was administered so that equal amounts of the injectate were placed at each foramen (level) into the transforaminal epidural space.   Additional Comments:  The patient tolerated the procedure well Dressing: Band-Aid    Post-procedure details: Patient was observed during the procedure. Post-procedure instructions were reviewed.  Patient left the clinic in stable condition.

## 2017-12-30 NOTE — Progress Notes (Signed)
Tony PorterRobert Siverson - 62 y.o. male MRN [id number]  Date of birth: 10-Apr-1956  Office Visit Note: Visit Date: 12/29/2017 PCP: Carylon PerchesFagan, Roy, MD Referred by: Carylon PerchesFagan, Roy, MD  Subjective: Chief Complaint  Patient presents with  . Lower Back - Pain   HPI: Mr. Tony Patterson is a pleasant 62 year old gentleman who comes in today at the request of Dr. Lenord CarboAntonia Ahearn for diagnostic and hopefully therapeutic right L5 transforaminal epidural steroid injection.  Patient describes history of chronic mechanical low back pain particularly on the right into the hip and thigh.  He has had worsening radicular pain at times.  He has not had prior epidural injection but has had knee injections in the past.  He has been treating this with Tylenol and chiropractic sessions which makes the pain somewhat better.  He reports a couple years of worsening right-sided low back pain.  MRI shows mainly lateral disc protrusion and foraminal narrowing on the right at L5-S1 and other general degenerative change.  He is also been doing massage in the past which is helped.  His case is complicated by alcohol induced polyneuropathy.   ROS Otherwise per HPI.  Assessment & Plan: Visit Diagnoses:  1. Radiculopathy due to lumbar intervertebral disc disorder   2. Chronic bilateral low back pain with right-sided sciatica   3. Alcoholic peripheral neuropathy (HCC)     Plan: No additional findings.   Meds & Orders:  Meds ordered this encounter  Medications  . betamethasone acetate-betamethasone sodium phosphate (CELESTONE) injection 12 mg    Orders Placed This Encounter  Procedures  . XR C-ARM NO REPORT  . Epidural Steroid injection    Follow-up: Return if symptoms worsen or fail to improve, for Dr. Lucia GaskinsAhern.   Procedures: No procedures performed  Lumbosacral Transforaminal Epidural Steroid Injection - Sub-Pedicular Approach with Fluoroscopic Guidance  Patient: Tony PorterRobert Patterson      Date of Birth: 10-Apr-1956 MRN: [id number] PCP: Carylon PerchesFagan, Roy,  MD      Visit Date: 12/29/2017   Universal Protocol:    Date/Time: 12/29/2017  Consent Given By: the patient  Position: PRONE  Additional Comments: Vital signs were monitored before and after the procedure. Patient was prepped and draped in the usual sterile fashion. The correct patient, procedure, and site was verified.   Injection Procedure Details:  Procedure Site One Meds Administered:  Meds ordered this encounter  Medications  . betamethasone acetate-betamethasone sodium phosphate (CELESTONE) injection 12 mg    Laterality: Right  Location/Site:  L5-S1  Needle size: 22 G  Needle type: Spinal  Needle Placement: Transforaminal  Findings:    -Comments: Excellent flow of contrast along the nerve and into the epidural space.  Procedure Details: After squaring off the end-plates to get a true AP view, the C-arm was positioned so that an oblique view of the foramen as noted above was visualized. The target area is just inferior to the "nose of the scotty dog" or sub pedicular. The soft tissues overlying this structure were infiltrated with 2-3 ml. of 1% Lidocaine without Epinephrine.  The spinal needle was inserted toward the target using a "trajectory" view along the fluoroscope beam.  Under AP and lateral visualization, the needle was advanced so it did not puncture dura and was located close the 6 O'Clock position of the pedical in AP tracterory. Biplanar projections were used to confirm position. Aspiration was confirmed to be negative for CSF and/or blood. A 1-2 ml. volume of Isovue-250 was injected and flow of contrast was noted at each  level. Radiographs were obtained for documentation purposes.   After attaining the desired flow of contrast documented above, a 0.5 to 1.0 ml test dose of 0.25% Marcaine was injected into each respective transforaminal space.  The patient was observed for 90 seconds post injection.  After no sensory deficits were reported, and normal  lower extremity motor function was noted,   the above injectate was administered so that equal amounts of the injectate were placed at each foramen (level) into the transforaminal epidural space.   Additional Comments:  The patient tolerated the procedure well Dressing: Band-Aid    Post-procedure details: Patient was observed during the procedure. Post-procedure instructions were reviewed.  Patient left the clinic in stable condition.    Clinical History: EXAM: MRI of the lumbar spine without contrast  ORDERING CLINICIAN: Naomie Dean M.D. CLINICAL HISTORY: 62 year old man with back pain, lumbar radiculopathy and right leg weakness COMPARISON FILMS: none  TECHNIQUE: MRI of the lumbar spine was obtained utilizing 4 mm sagittal slices from T11-12 down to the lower sacrum with T1, T2 and inversion recovery views. In addition 4 mm axial slices from L1-2 down to L5-S1 level were included with T1 and T2 weighted views. CONTRAST: None IMAGING SITE: [facility], [address] [place], [place], [place]  FINDINGS: On sagittal images, the spine is imaged from T10-T11 to the sacrum.  There are bilateral renal cysts.     There is a 90% anterior wedge compression fracture of the T12 vertebral body. Mild edema is noted on the STIR images consistent with a subacute to sub-chronic timeframe.   A chronic swirls node enters the inferior T11 endplate. Other vertebral bodies appear normal.  The discs and interspaces were further evaluated on axial views from T11 to S1 as follows:  T11 - T12: The disc appears normal there is mild bony intrusion the superior endplate into the spinal canal associated with the fracture. This causes moderate spinal stenosis. There is no nerve root compression.  T12 - L1:  There is rightward disc protrusion causing mild right foraminal narrowing but no nerve root compression.  L1-L2: The disc appears normal. There is minimal facet hypertrophy. Neural foramina are not  narrowed and there is no nerve root compression.  L2-L3: The disc and interspace appear normal. There is no foraminal narrowing or nerve root compression.  L3-L4: There is a right disc protrusion and mild facet hypertrophy with left ligamentum flavum hypertrophy. There is moderate right lateral recess and moderate right foraminal narrowing. There is no definite nerve root compression.  L4-L5: There is minimal central disc bulging and mild facet hypertrophy. The neural foramina are not significantly narrowed and there is no nerve root compression.  L5-S1: There is a right lateral disc protrusion and mild facet hypertrophy causing moderate right foraminal narrowing and displacing the right L5 nerve root.  The lateral recesses are not narrowed.  IMPRESSION:  This is an abnormal MRI of the lumbar spine showed the following: 1.    There is a 90% anterior wedge compression fracture of the T12 vertebral body. There is mild edema noted on the STIR images consistent with a subacute to subchronic timeframe. 2.    At L3-L4, there is a right ischemic protrusion and mild facet hypertrophy causing moderate right lateral recess stenosis and moderate right foraminal narrowing. There is no definite nerve root compression though the degenerative changes encroach upon the right L3 and L4 nerve roots. 3.    At L5-S1, there is right lateral disc protrusion causing moderate right  foraminal narrowing displacing the right L5 nerve root. 4.   There are milder degenerative changes at the other levels with less potential for nerve root compression     INTERPRETING PHYSICIAN:  Richard A. Epimenio Foot, MD, PhD, FAAN Certified in  Neuroimaging by [employer]   He reports that he quit smoking about 22 years ago. His smoking use included cigarettes. He has a 10.00 pack-year smoking history. He has never used smokeless tobacco. No results for input(s): HGBA1C, LABURIC in the last 8760  hours.  Objective:  VS:  HT:    WT:   BMI:     BP:(!) 134/95  HR:64bpm  TEMP: ( )  RESP:  Physical Exam  Ortho Exam Imaging: Xr C-arm No Report  Result Date: 12/29/2017 Please see Notes tab for imaging impression.   Past Medical/Family/Surgical/Social History: Medications & Allergies reviewed per EMR, new medications updated. Patient Active Problem List   Diagnosis Date Noted  . ETOH abuse   . Alcoholic peripheral neuropathy (HCC)   . Non-traumatic rhabdomyolysis   . AKI (acute kidney injury) (HCC)   . Benign essential HTN   . Fall   . Community acquired pneumonia   . Hypokalemia   . Hypomagnesemia   . Hypoalbuminemia due to protein-calorie malnutrition (HCC)   . Transaminitis   . Acute blood loss anemia   . Acute respiratory failure with hypoxia (HCC)   . Altered mental status 04/12/2016  . Alcohol withdrawal seizure (HCC) 04/12/2016  . Rhabdomyolysis 04/12/2016  . Hypertension 04/12/2016  . Dyslipidemia 04/12/2016  . Chronic kidney disease 04/12/2016  . Elevated liver enzymes 04/12/2016  . Depression 04/12/2016  . Anxiety 04/12/2016  . Neuropathy, alcoholic (HCC) 02/22/2016  . History of adenomatous polyp of colon 03/10/2015  . Alcohol abuse, continuous 03/07/2014   Past Medical History:  Diagnosis Date  . Alcohol abuse   . Anemia   . Anxiety   . Hypertension   . Neuropathy   . Seizures (HCC)    unknown etiology and on no meds; been 4 years since seizure.   Family History  Problem Relation Age of Onset  . Fibromyalgia Mother   . Dementia Mother   . Congestive Heart Failure Mother   . Congestive Heart Failure Father   . Coronary artery disease Father 69       CABG 3 different times  . Hypertension Brother   . CAD Brother 28  . Hypertension Brother   . CAD Brother 2  . Neuropathy Neg Hx   . Colon cancer Neg Hx    Past Surgical History:  Procedure Laterality Date  . COLONOSCOPY  05/18/2007   [id number] hemorrhoids, a single anal papilla,  otherwise normal/ Single polyp, as described above, removed   . COLONOSCOPY WITH PROPOFOL N/A 03/16/2015   Procedure: COLONOSCOPY WITH PROPOFOL;  Surgeon: Corbin Ade, MD;  Location: [facility];  Service: Endoscopy;  Laterality: N/A;  in cecum at 1344. withdrawal time   . THROAT SURGERY     polyps removed x2   Social History   Occupational History  . Occupation: Writer: [employer]    Comment: Neurosurgeon  Tobacco Use  . Smoking status: Former Smoker    Packs/day: 0.50    Years: 20.00    Pack years: 10.00    Types: Cigarettes    Last attempt to quit: 06/03/1995    Years since quitting: 22.5  . Smokeless tobacco: Never Used  Substance and Sexual Activity  .  Alcohol use: Yes    Comment: Previous alcohol abuse; drank 3 weeks ago  . Drug use: No  . Sexual activity: Never    Birth control/protection: None

## 2018-01-13 ENCOUNTER — Telehealth: Payer: Self-pay | Admitting: *Deleted

## 2018-01-13 ENCOUNTER — Encounter: Payer: Self-pay | Admitting: Cardiology

## 2018-01-13 DIAGNOSIS — R931 Abnormal findings on diagnostic imaging of heart and coronary circulation: Secondary | ICD-10-CM

## 2018-01-13 NOTE — Telephone Encounter (Signed)
GXT ordered and send to scheduler to be schedule 

## 2018-01-13 NOTE — Telephone Encounter (Signed)
-----   Message from Rollene RotundaJames Hochrein, MD sent at 01/13/2018 12:17 PM EDT ----- Positive coronary calcium.  Patient aware.  Needs a POET (Plain Old Exercise Treadmill).  Please call to schedule.  Send results to Carylon PerchesFagan, Roy, MD

## 2018-01-20 ENCOUNTER — Telehealth (HOSPITAL_COMMUNITY): Payer: Self-pay

## 2018-01-20 NOTE — Telephone Encounter (Signed)
Encounter complete. 

## 2018-01-21 ENCOUNTER — Telehealth (HOSPITAL_COMMUNITY): Payer: Self-pay

## 2018-01-21 NOTE — Telephone Encounter (Signed)
Encounter complete. 

## 2018-01-22 ENCOUNTER — Ambulatory Visit (HOSPITAL_COMMUNITY)
Admission: RE | Admit: 2018-01-22 | Discharge: 2018-01-22 | Disposition: A | Discharge status: Home / Self Care | Payer: BLUE CROSS/BLUE SHIELD | Source: Ambulatory Visit | Attending: Cardiology | Admitting: Cardiology

## 2018-01-22 ENCOUNTER — Telehealth: Payer: Self-pay | Admitting: Cardiology

## 2018-01-22 ENCOUNTER — Encounter (HOSPITAL_COMMUNITY): Payer: Self-pay

## 2018-01-22 DIAGNOSIS — R931 Abnormal findings on diagnostic imaging of heart and coronary circulation: Secondary | ICD-10-CM

## 2018-01-22 NOTE — Telephone Encounter (Signed)
He came for a stress test today but his BP was too high.  He was holding beta blocker.  He needs to restart his meds.   Have him keep a BP diary.  If BP remains high he will need to let us know to make adjustments.  I would to reschedule the POET (Plain Old Exercise Treadmill) if his BP comes down.

## 2018-01-23 ENCOUNTER — Telehealth: Payer: Self-pay | Admitting: Cardiology

## 2018-01-23 NOTE — Telephone Encounter (Signed)
Call pt and informed that a message will be sent for someone from scheduling to contact him in regards to scheduling test. Verbalized understanding.

## 2018-01-23 NOTE — Telephone Encounter (Signed)
Leave message for pt to call back 

## 2018-01-23 NOTE — Telephone Encounter (Signed)
Called patient and LVM to call back to schedule his GXT. °

## 2018-01-23 NOTE — Telephone Encounter (Signed)
New Message        Patient is requesting a call to resch. His stress test.

## 2018-01-26 ENCOUNTER — Encounter: Payer: Self-pay | Admitting: Neurology

## 2018-01-26 ENCOUNTER — Ambulatory Visit (INDEPENDENT_AMBULATORY_CARE_PROVIDER_SITE_OTHER): Payer: BLUE CROSS/BLUE SHIELD | Admitting: Neurology

## 2018-01-26 VITALS — BP 153/87 | HR 61 | Ht 75.0 in | Wt 257.0 lb

## 2018-01-26 DIAGNOSIS — R0683 Snoring: Secondary | ICD-10-CM | POA: Diagnosis not present

## 2018-01-26 DIAGNOSIS — E669 Obesity, unspecified: Secondary | ICD-10-CM | POA: Diagnosis not present

## 2018-01-26 DIAGNOSIS — F10982 Alcohol use, unspecified with alcohol-induced sleep disorder: Secondary | ICD-10-CM

## 2018-01-26 DIAGNOSIS — I10 Essential (primary) hypertension: Secondary | ICD-10-CM | POA: Diagnosis not present

## 2018-01-26 DIAGNOSIS — R252 Cramp and spasm: Secondary | ICD-10-CM

## 2018-01-26 DIAGNOSIS — R351 Nocturia: Secondary | ICD-10-CM | POA: Insufficient documentation

## 2018-01-26 NOTE — Telephone Encounter (Signed)
Leave message for pt to call back 

## 2018-01-26 NOTE — Patient Instructions (Addendum)
Take extra Thiamine po as a supplement, and an OTC multimineral for magnesium, drink coconut water for Potassium replenishment after exercise.

## 2018-01-26 NOTE — Progress Notes (Signed)
SLEEP MEDICINE CLINIC   Provider:  Melvyn Novasarmen  Eldred Patterson, M D  Primary Care Physician:  Tony PerchesFagan, Roy, MD   Referring Provider: Dr. Naomie DeanAntonia Ahern, MD - Angelina SheriffJake Hochrein, MD    Chief Complaint  Patient presents with  . New Patient (Initial Visit)    pt alone, rm 10. pt states his nephew has witnessed apnea events and snoring,has never had a sleep study. Patient  states that he doesnt normally sleep well during the night, gained 17 pounds in the last 6 month.     HPI:  Tony Patterson is a 62 y.o. male patient, and Tony Patterson, seen here as in a referral from Dr. Lucia GaskinsAhern, MD and cardiologist Dr. Antoine Patterson.  Mr. Tony Patterson had been seen multiple times by Dr. Lucia Patterson whom he has followed since 2015 she was his gabapentin for a severe axonal sensory motor or polyneuropathy.  The patient has followed for neuropathy and increased risk of falling related to such with Dr. Lucia Patterson.  He also had a seizure in 2017  precipitated by alcohol abuse, he was treated for alcoholism since the time. He has been sober for 10 month, but still craves alcohol.  Her notes from June 2019 also states that he has been followed up for an alcoholic polyneuropathy, memory issues but he also has gained significant weight.  He has a lumbar radiculopathy, was referred to healthy weight and wellness after that visit.  Dr. Francena HanlyBeasley's office has already called him.  He snores excessively he nods off a lot and endorsed the Epworth score at 13-14 points, and get gets up every night 4-5 times to urinate.  He also has leg cramps at night which may be neuropathy related.  In 1 over the last electrolyte tests he had low magnesium, low potassium, she also wants him to go back to work out and in which the protein in his diet.  He decided to retire in 2017. He had been [place] a [place], drank a lot  and sat "14 hours straight" - he was concerned about leg swelling after that, but no DVT was found.      Chief complaint according  to patient :" I may have Apnea"  Sleep habits are as follows: sleep habits changed after alcohol rehab.  He avoids breakfast, goes sometimes to [facility] in the mornings. has Lunch at 1 PM , eats dinner 5-6 PM- earlier than last last year- his current girlfriend has changed that.  He will doze off while watching TV, in a recliner. He will be asleep when ever not stimulated or physically active.  He goes to the Gym now in PM before dinner.  He goes to the bedroom by midnight. No TV watching in the bedroom, which is cool , quiet and dark. Using a box fan. He sleeps promptly on his side, on 1 pillow, on a flat bed.  He may sleep 90-120 minutes and wake up for nocturia. Continues throughout the night 4-5 per night. He has been told he snores- by girlfriend and nephew.  Nephew witnessed apneas. He rises at 9 AM, already awake.   Sleep medical history and family sleep history: one Tony with OSA. Has HTN, history of alcoholism- reports no family affected except paternal grandfather.    Social history:  Twice divorced , last divorced 12 years ago - children none.    Recovering alcoholic, retired after 18 years from [employer] until 1996 , later worked in [employer] business.  He was a night shifts while at [employer]. [phone].Smoked while working 18 years at [employer], quit 05-1995.  Caffeine use 1 mug in AM, iced tea some nights, coke zero sodas 1-2 weekly. Regular exerciser.    Review of Systems: Out of a complete 14 system review, the patient complains of only the following symptoms, and all other reviewed systems are negative. Snoring, no headaches, insomnia,  lightheadedness with HTN, no nausea since he stopped drinking. Memory impaired, hearing impaired. Dysphonia, GERD.  Epworth sleepiness score endorsed elevated at  12-14 , Fatigue severity score 21/ 63   , depression score 1/ 15 .    Social History   Socioeconomic History  . Marital status: Divorced    Spouse name: Not on file  .  Number of children: 0  . Years of education: college 1  . Highest education level: Not on file  Occupational History  . Occupation: Writer: [employer]    Comment: Neurosurgeon  Social Needs  . Financial resource strain: Not on file  . Food insecurity:    Worry: Not on file    Inability: Not on file  . Transportation needs:    Medical: Not on file    Non-medical: Not on file  Tobacco Use  . Smoking status: Former Smoker    Packs/day: 0.50    Years: 20.00    Pack years: 10.00    Types: Cigarettes    Last attempt to quit: 06/03/1995    Years since quitting: 22.6  . Smokeless tobacco: Never Used  Substance and Sexual Activity  . Alcohol use: Yes    Comment: Previous alcohol abuse; drank 3 weeks ago  . Drug use: No  . Sexual activity: Never    Birth control/protection: None  Lifestyle  . Physical activity:    Days per week: Not on file    Minutes per session: Not on file  . Stress: Not on file  Relationships  . Social connections:    Talks on phone: Not on file    Gets together: Not on file    Attends religious service: Not on file    Active member of club or organization: Not on file    Attends meetings of clubs or organizations: Not on file    Relationship status: Not on file  . Intimate partner violence:    Fear of current or ex partner: Not on file    Emotionally abused: Not on file    Physically abused: Not on file    Forced sexual activity: Not on file  Other Topics Concern  . Not on file  Social History Narrative   Left-handed   Caffeine use: 1 large cup of coffee in the morning, Coke (caffeine free) ocass    Family History  Problem Relation Age of Onset  . Fibromyalgia Mother   . Dementia Mother   . Congestive Heart Failure Mother   . Congestive Heart Failure Father   . Coronary artery disease Father 38       CABG 3 different times  . Hypertension Tony   . CAD Tony 82  . Hypertension Tony   . CAD Tony 54  .  Neuropathy Neg Hx   . Colon cancer Neg Hx     Past Medical History:  Diagnosis Date  . Alcohol abuse   . Anemia   . Anxiety   . Hypertension   . Neuropathy   . Seizures (HCC)    unknown etiology and on no  meds; been 4 years since seizure.    Past Surgical History:  Procedure Laterality Date  . COLONOSCOPY  05/18/2007   BJY:NWGNFAOZ hemorrhoids, a single anal papilla, otherwise normal/ Single polyp, as described above, removed   . COLONOSCOPY WITH PROPOFOL N/A 03/16/2015   Procedure: COLONOSCOPY WITH PROPOFOL;  Surgeon: Corbin Ade, MD;  Location: [facility];  Service: Endoscopy;  Laterality: N/A;  in cecum at 1344. withdrawal time   . THROAT SURGERY     polyps removed x2    Current Outpatient Medications  Medication Sig Dispense Refill  . aspirin EC 81 MG tablet Take 81 mg by mouth daily.    Marland Kitchen atorvastatin (LIPITOR) 40 MG tablet Take 40 mg by mouth at bedtime.    Marland Kitchen b complex vitamins capsule Take 1 capsule by mouth daily.    . folic acid (FOLVITE) 1 MG tablet Take 1 tablet (1 mg total) by mouth daily.    Marland Kitchen gabapentin (NEURONTIN) 600 MG tablet 2 tablets (1200 mg) three times a day as needed 360 tablet 11  . lidocaine (LIDODERM) 5 % Place 2 patches onto the skin daily. Remove & Discard patch within 12 hours or as directed by MD 60 patch 11  . magnesium oxide (MAG-OX) 400 MG tablet Take 400 mg by mouth at bedtime.    . metoprolol (LOPRESSOR) 50 MG tablet Take 50 mg by mouth 2 (two) times daily.     . Multiple Vitamin (MULTIVITAMIN WITH MINERALS) TABS tablet Take 1 tablet by mouth daily.    . Naphazoline HCl (CLEAR EYES OP) Place 1 drop into both eyes daily.    Marland Kitchen omeprazole (PRILOSEC) 20 MG capsule Take 20 mg by mouth daily.     . tamsulosin (FLOMAX) 0.4 MG CAPS capsule Take 0.4 mg by mouth daily.     . traZODone (DESYREL) 100 MG tablet Take 1 tablet (100 mg total) by mouth at bedtime as needed. 30 tablet 11   Current Facility-Administered Medications  Medication Dose Route  Frequency Provider Last Rate Last Dose  . betamethasone acetate-betamethasone sodium phosphate (CELESTONE) injection 12 mg  12 mg Other Once Tyrell Antonio, MD        Allergies as of 01/26/2018 - Review Complete 01/26/2018  Allergen Reaction Noted  . Bee venom Anaphylaxis 10/08/2011  . Penicillins Other (See Comments) 10/08/2011    Vitals: BP (!) 153/87   Pulse 61   Ht 6\' 3"  (1.905 m)   Wt 257 lb (116.6 kg)   BMI 32.12 kg/m  Last Weight:  Wt Readings from Last 1 Encounters:  01/26/18 257 lb (116.6 kg)   HYQ:MVHQ mass index is 32.12 kg/m.     Last Height:   Ht Readings from Last 1 Encounters:  01/26/18 6\' 3"  (1.905 m)    Physical exam:  General: The patient is awake, alert and appears not in acute distress. The patient is well groomed. Head: Normocephalic, atraumatic. Neck is supple. Mallampati  3-  Very swollen and large uvula.  neck circumference:19. 5"  Nasal airflow congested , Retrognathia is not seen.  Cardiovascular:  Regular rate and rhythm , without murmurs or carotid bruit, and without distended neck veins. Respiratory: Lungs are clear to auscultation. Skin:  Without evidence of edema, or rash Trunk: BMI is 32.12. The patient's posture is stooped.    Neurologic exam : The patient is awake and alert, oriented to place and time.   Memory subjective described as impaired . Attention span & concentration ability appears normal.  Speech  is fluent,  without dysarthria, dysphonia or aphasia.  Mood and affect are appropriate.  Cranial nerves:  taste and smell unimpaired. Pupils are equal and briskly reactive to light. Funduscopic exam without evidence of pallor or edema.  Extraocular movements  in vertical and horizontal planes intact and without nystagmus. Visual fields by finger perimetry are intact. Hearing to finger rub intact. Facial sensation intact to fine touch. Facial motor strength is symmetric and tongue and uvula move midline. Shoulder shrug was  symmetrical.   Motor exam:  Normal tone, but reduced lower extremity muscle bulk, with symmetric strength in all extremities. Good grip strength, good hip aduction and abduction, but weakness of plantar flexion and dorsiflexion. Foot drop.  Sensory:  Fine touch, pinprick and vibration were reduced in lower extremities.  Coordination: Rapid alternating movements in the fingers/hands was normal. Finger-to-nose maneuver  normal without evidence of ataxia, dysmetria or tremor. Gait and station: Patient walks without assistive device but has to lift his feet- stork . Stance is stable while wide based. Tandem gait is fragmented. Turns with 4-5  Steps. Romberg testing is negative.  Deep tendon reflexes: in the upper and lower extremities are symmetric and intact, except achilles tendon reflex.. Babinski maneuver response is equivocal    Assessment:  After physical and neurologic examination, review of laboratory studies,  Personal review of imaging studies, reports of other /same  Imaging studies, results of polysomnography and / or neurophysiology testing and pre-existing records as far as provided in visit., my assessment is   1)  Yes, I agree hat this patient has very likely a sleep disorder- first is an insomnia , likely substance induced and will take long time to recover. He denies depression. He slept well while he was drinking. He will recover over the next years as long as he stays sober, trazodone is a good choice.   2) OSA- witnessed snoring and apnea. Large neck, elevated BMI and swollen uvula- can also be related to acid reflux.   3)  Leg cramps - I would  Like for him to take magnesium po, and drink coconut water when exercise.   4) Nocturia - unclear if related to BPH or untreated apnea, he reports having small volume urination- bladder feels not empty.    The patient was advised of the nature of the diagnosed disorder , the treatment options and the  risks for general health and  wellness arising from not treating the condition.   I spent more than 50 minutes of face to face time with the patient.  Greater than 50% of time was spent in counseling and coordination of care. We have discussed the diagnosis and differential and I answered the patient's questions.    Plan:  Treatment plan and additional workup : Attended sleep study, SPLIT at AHI 20, take Trazodone with you.  Treatment of suspected BPH with PCP or Urology. No detrol or other anticholinergic, please.  Gabapentin OK at night.     Tony Novas, MD 01/26/2018, 11:45 AM  Certified in Neurology by ABPN Certified in Sleep Medicine by [facility] [address] [place], [place] [address]

## 2018-01-29 ENCOUNTER — Ambulatory Visit: Payer: BLUE CROSS/BLUE SHIELD

## 2018-01-29 ENCOUNTER — Telehealth: Payer: Self-pay | Admitting: Radiology

## 2018-01-29 NOTE — Telephone Encounter (Signed)
Patient came in for his GXT his initial BP was 197/119. I let his sit for 15 min and rechecked it and it was down to 178/109. I spoke to the [facility]([place]). He recommended to cancel the test for today as his BP was still high.

## 2018-02-03 NOTE — Telephone Encounter (Signed)
I suspect that he was holding his beta blocker for this procedure.  If that is the case, I would still like to try a POET (Plain Old Exercise Treadmill) but would ask that he not hold his beta blocker.

## 2018-02-04 NOTE — Telephone Encounter (Signed)
Leave message for pt to call back 

## 2018-02-09 NOTE — Telephone Encounter (Signed)
Leave message for pt to call back several times, this encounter will be close 

## 2018-02-09 NOTE — Telephone Encounter (Signed)
Leave message for pt to call back several times, this encounter will be close

## 2018-02-19 ENCOUNTER — Telehealth: Payer: Self-pay | Admitting: Cardiology

## 2018-02-19 NOTE — Telephone Encounter (Signed)
New Message          Patient called today to let us know that his blood pressure is still running high and he don't want schedule his stress test until he gets in under control. Pls call and advise.

## 2018-02-19 NOTE — Telephone Encounter (Signed)
Attempted to contact, line was busy

## 2018-02-20 NOTE — Telephone Encounter (Signed)
2nd attempt to contact pt. lmtcb.

## 2018-02-24 NOTE — Telephone Encounter (Signed)
Called patient, LVM to call back.

## 2018-02-24 NOTE — Telephone Encounter (Signed)
Patient did not return call, after three attempts taking out of triage.

## 2018-03-01 ENCOUNTER — Ambulatory Visit (INDEPENDENT_AMBULATORY_CARE_PROVIDER_SITE_OTHER): Payer: BLUE CROSS/BLUE SHIELD | Admitting: Neurology

## 2018-03-01 DIAGNOSIS — G4733 Obstructive sleep apnea (adult) (pediatric): Secondary | ICD-10-CM

## 2018-03-01 DIAGNOSIS — R252 Cramp and spasm: Secondary | ICD-10-CM

## 2018-03-01 DIAGNOSIS — G4736 Sleep related hypoventilation in conditions classified elsewhere: Secondary | ICD-10-CM

## 2018-03-01 DIAGNOSIS — R0683 Snoring: Secondary | ICD-10-CM

## 2018-03-01 DIAGNOSIS — J984 Other disorders of lung: Secondary | ICD-10-CM

## 2018-03-01 DIAGNOSIS — F10982 Alcohol use, unspecified with alcohol-induced sleep disorder: Secondary | ICD-10-CM

## 2018-03-01 DIAGNOSIS — I10 Essential (primary) hypertension: Secondary | ICD-10-CM

## 2018-03-01 DIAGNOSIS — R351 Nocturia: Secondary | ICD-10-CM

## 2018-03-01 DIAGNOSIS — E669 Obesity, unspecified: Secondary | ICD-10-CM

## 2018-03-06 DIAGNOSIS — G4733 Obstructive sleep apnea (adult) (pediatric): Secondary | ICD-10-CM | POA: Insufficient documentation

## 2018-03-06 NOTE — Procedures (Signed)
PATIENT'S NAME:  Tony Patterson, Tony Patterson DOB:      04/28/1956      MR#:    [id number]     DATE OF RECORDING: 03/01/2018 REFERRING M.D.:  Naomie Dean, M.D. Study Performed:  Split-Night Titration Study HISTORY:  Tony Patterson is a 62 y.o. male patient seen on 01-26-2018 in a referral from Dr. Lucia Gaskins, MD and cardiologist, Dr. Antoine Poche. Tony Patterson had been seen for a severe axonal sensory motor or polyneuropathy precipitated by alcohol abuse, he was treated for alcoholism since the time. He has been sober for 10 month. He also has gained significant weight, has a lumbar radiculopathy, dysphonia and hearing impairment. He snores excessively, he nods off a lot and endorsed the Epworth score at 13-14 points, and get gets up every night 4-5 times to urinate.  He also has leg cramps at night which may be neuropathy related.  The patient endorsed the Epworth Sleepiness Scale at 14/24 points, FSS at 21/63.   The patient's weight 257 pounds with a height of 75 (inches), resulting in a BMI of 32.1 kg/m2. The patient's neck circumference measured 19.5 inches.  CURRENT MEDICATIONS: Aspirin, Lipitor, B complex, Folvite, Neurontin, Lidoderm, Mag-ox, Lopressor, Multivitamins, Prilosec, Flomax, Desyrel. PROCEDURE:  This is a multichannel digital polysomnogram utilizing the Somnostar 11.2 system.  Electrodes and sensors were applied and monitored per AASM Specifications.   EEG, EOG, Chin and Limb EMG, were sampled at 200 Hz.  ECG, Snore and Nasal Pressure, Thermal Airflow, Respiratory Effort, CPAP Flow and Pressure, Oximetry was sampled at 50 Hz. Digital video and audio were recorded.      BASELINE STUDY WITHOUT CPAP RESULTS: Lights Out was at 21:03 and Lights On at 05:17.  Total recording time (TRT) was 245, with a total sleep time (TST) of 122.5 minutes.   The patient's sleep latency was 15.5 minutes.  REM latency was 185 minutes.  The sleep efficiency was 50.0 %.    SLEEP ARCHITECTURE: WASO (Wake after sleep onset) was 103.5  minutes, Stage N1 was 8 minutes, Stage N2 was 102.5 minutes, Stage N3 was 0 minutes and Stage R (REM sleep) was 12 minutes.  The percentages were Stage N1 6.5%, Stage N2 83.7%, Stage N3 0% and Stage R (REM sleep) 9.8%.  The arousals were noted as: 25 were spontaneous, 4 were associated with PLMs, and 94 were associated with respiratory events.  RESPIRATORY ANALYSIS:  There were a total of 142 respiratory events:  25 obstructive apneas, 117 hypopneas with respiratory event related arousals (RERAs). Snoring was noted.   The total APNEA/HYPOPNEA INDEX (AHI) was 69.6 /hour.  12 events occurred in REM sleep and 210 events in NREM. The REM AHI was 60 /hour, versus a non-REM AHI of 70.6 /hour. The patient spent 236 minutes of sleep time in the supine position and 112 minutes in non-supine. The supine AHI was 69.5 /hour versus a non-supine AHI of 0.0 /hour.  OXYGEN SATURATION & C02:  The wake baseline 02 saturation was 94%, with the lowest being 63%. Time spent below 89% saturation equaled 95 minutes.  PERIODIC LIMB MOVEMENTS: The patient had a total of 45 Periodic Limb Movements. The Periodic Limb Movement (PLM) index was 22.0 /hour and the PLM Arousal index was 2.0 /hour. The patient took very frequent bathroom breaks (4). Snoring was noted.   TITRATION STUDY WITH CPAP RESULTS: CPAP was initiated at 5 cmH20 with heated humidity per AASM split night standards and pressure was advanced to 14 cm water, with a residual AHI of  23.4/h.  A change to BiPAP at 15/11 cmH20 was undertaken, and advanced to a BiPAP pressure of 17/13 cmH20, there was a marginal reduction of the AHI to 17.6 /hour. A FFM SIMPLUS in medium was given to the patient.  Total recording time (TRT) was 245.5 minutes, with a total sleep time (TST) of 225.5 minutes. The patient's sleep latency was 1.5 minutes. REM latency was 12.5 minutes.  The sleep efficiency was 91.9 %.    SLEEP ARCHITECTURE: Wake after sleep was 18 minutes, Stage N1 2 minutes,  Stage N2 57 minutes, Stage N3 76.5 minutes and Stage R (REM sleep) 90 minutes. The percentages were: Stage N1 .9%, Stage N2 25.3%, Stage N3 33.9% and Stage R (REM sleep) 39.9%.   RESPIRATORY ANALYSIS:  There were a total of 116 respiratory events: 12 obstructive apneas, 11 central apneas and 0 mixed apneas with a total of 23 apneas and an apnea index (AI) of 6.1. There were 93 hypopneas with a hypopnea index of 24.7 /hour. The patient also had 0 respiratory event related arousals (RERAs). The total APNEA/HYPOPNEA INDEX (AHI) was 30.9 /hour and the total RESPIRATORY DISTURBANCE INDEX was 30.9 /hour. 51 events occurred in REM sleep and 65 events in NREM. The REM AHI was 34.0 /hour versus a non-REM AHI of 28.8 /hour. The patient spent 50% of total sleep time in the supine position. The supine AHI was 29.6 /hour, versus a non-supine AHI of 32.1/hour.  OXYGEN SATURATION & C02:  The wake baseline 02 saturation was 93%, with the lowest being 57%. Time spent below 89% saturation equaled 94 minutes. AROUSAL/ PERIODIC LIMB MOVEMENTS:   The patient had a total of 23 Periodic Limb Movements. The Periodic Limb Movement (PLM) index was 6.1 /hour and the PLM Arousal index was 1.1 /hour. The arousals were noted as: 18 were spontaneous, 4 were associated with PLMs, and 20 were associated with respiratory events. POLYSOMNOGRAPHY IMPRESSION :   1. Severe Obstructive Sleep Apnea (OSA) and hypopnea at AHI 69.6/h while in supine position. Frequent desaturations to SpO2 nadir of 50% and prolonged total hypoxemia.  2. CPAP and BIPAP titration have failed to effectively alleviate apneas, hypopneas, and hypoxemia.   3. Dysfunctions associated with sleep stages or arousals from sleep. Patient awoke from a vivid dream.  4. Mild Periodic Limb Movement Disorder (PLMD). 5. Variable R to R intervals on EKG.  RECOMMENDATIONS: Return for a full night titration to PAP modalities. If hypoxemia persists at AHI of  5/h or below, please use  Oxygen as needed.   A follow up appointment will then be scheduled in the Sleep Clinic at [facility].     I certify that I have reviewed the entire raw data recording prior to the issuance of this report in accordance with the Standards of Accreditation of the American Academy of Sleep Medicine (AASM)      Melvyn Novas, M.D.  03-06-2018 Diplomat, American Board of Psychiatry and Neurology  Diplomat, American Board of Sleep Medicine Medical Director, [place] [facility]

## 2018-03-06 NOTE — Addendum Note (Signed)
Addended by: Melvyn Novas on: 03/06/2018 01:49 PM   Modules accepted: Orders

## 2018-03-09 ENCOUNTER — Telehealth: Payer: Self-pay | Admitting: *Deleted

## 2018-03-09 ENCOUNTER — Telehealth: Payer: Self-pay

## 2018-03-09 NOTE — Telephone Encounter (Signed)
We are currently waiting on PA for the second part of sleep study. Will call the pt on his cell phone to get him scheduled once we hear back from insurance.

## 2018-03-09 NOTE — Telephone Encounter (Signed)
[employer] denied Bipap titration despite titration was not completed while in lab. They want him to have Auto bipap first at home.

## 2018-03-09 NOTE — Telephone Encounter (Signed)
-----   Message from Melvyn Novas, MD sent at 03/06/2018  1:49 PM EDT ----- Severe OSA/ mostly hypopneas with even more severe hypoxemia. CPAP did not help, BiPAP failed but wasn't fully explored.  Patient needs to return for full night PAP titration and possible oxygen addition.   POLYSOMNOGRAPHY IMPRESSION :   1. Severe Obstructive Sleep Apnea (OSA) and hypopnea at AHI  69.6/h while in supine position. Frequent desaturations to SpO2  nadir of 50% and prolonged total hypoxemia.  2. CPAP and BIPAP titration have failed to effectively alleviate  apneas, hypopneas, and hypoxemia.  3. Dysfunctions associated with sleep stages or arousals from  sleep. Patient awoke from a vivid dream.  4. Mild Periodic Limb Movement Disorder (PLMD). 5. Variable R to R intervals on EKG.  RECOMMENDATIONS: Return for a full night titration to PAP  modalities. If hypoxemia persists at AHI of 5/h or below, please  use Oxygen as needed.

## 2018-03-09 NOTE — Telephone Encounter (Signed)
Noted thank you

## 2018-03-09 NOTE — Telephone Encounter (Signed)
Called patient and discussed his sleep study. I advised him that Dr. Vickey Huger said he has severe obstructive sleep apnea. Discussed that pt had an AHI of 69/h while supine. His 02 dropped frequently as well and was as low as 50. Advised pt that Dr. Vickey Huger has ordered a full night titration sleep study where he will return to the office to use Bipap. Patient may need oxygen added. He verbalized understanding. He stated that he is going to the beach tomorrow for a week. He will be able to schedule the sleep study while he is gone if we just call his cell phone. RN also advised patient to sleep on his side as much as he can and to place a pillow behind his back to keep him turned on his side. He verbalized understanding and stated he usually sleeps on his side at home. Pt had no further questions or concerns and verbalized appreciation for the call.

## 2018-03-10 NOTE — Telephone Encounter (Signed)
Dr Dohmeier advised that the patient start auto Bipap 16/12-22/18 cm water pressure. She instructed to advise the patient that if he isnt able to tolerate or has any problems once starting this machine to contact us. I will inform the patient of this.   Called the patient to advise that insurance will not cover to bring the patient in for the Bipap titration test. They are wanting the patient to attempt an auto Bipap first and see if he is able to tolerate and be treated first with that. I have contacted the patient and there was no answer. LVM informing the patient to call back.

## 2018-03-11 ENCOUNTER — Other Ambulatory Visit: Payer: Self-pay | Admitting: Neurology

## 2018-03-11 ENCOUNTER — Telehealth: Payer: Self-pay | Admitting: Neurology

## 2018-03-11 DIAGNOSIS — E669 Obesity, unspecified: Secondary | ICD-10-CM

## 2018-03-11 DIAGNOSIS — R400 Somnolence: Secondary | ICD-10-CM

## 2018-03-11 DIAGNOSIS — R0683 Snoring: Secondary | ICD-10-CM

## 2018-03-11 DIAGNOSIS — G4733 Obstructive sleep apnea (adult) (pediatric): Secondary | ICD-10-CM

## 2018-03-11 DIAGNOSIS — G4719 Other hypersomnia: Secondary | ICD-10-CM

## 2018-03-11 DIAGNOSIS — E66811 Obesity, class 1: Secondary | ICD-10-CM

## 2018-03-11 NOTE — Telephone Encounter (Signed)
Called the pt back. I advised pt that the insurance denied for him to come into the sleep lab for the bipap titration. Dr Vickey Huger is recommending since insurance is denying that test for the patient to attempt auto Bipap. I advised pt that an order will be sent to a DME, [facility], and [facility] will call the pt within about one week after they file with the pt's insurance. [facility] will show the pt how to use the machine, fit for masks, and troubleshoot the CPAP if needed. A follow up appt was made for insurance purposes with Dr. Vickey Huger on Jan 14,2019 at 8:30 am. Pt verbalized understanding to arrive 15 minutes early and bring their CPAP. A letter with all of this information in it will be mailed to the pt as a reminder. I verified with the pt that the address we have on file is correct. Pt verbalized understanding of results. Pt had no questions at this time but was encouraged to call back if questions arise.

## 2018-03-11 NOTE — Telephone Encounter (Signed)
Pt returned your call. He [place] beach [place] and is requesting a call back on his cell number.

## 2018-03-17 NOTE — Telephone Encounter (Signed)
Called the patient back. Asked if [facility] had reached out to him yet. The patient states no. Advised the patient to attempt calling [facility] [phone] and see what the status is on his order and getting set up. Pt verbalized understanding.  Marland Kitchen

## 2018-03-17 NOTE — Telephone Encounter (Signed)
Patient called in stating he isn't sure if he is suppose to come to our office to pick up his CPAP or somewhere else.

## 2018-04-28 ENCOUNTER — Ambulatory Visit (HOSPITAL_COMMUNITY)
Admission: RE | Admit: 2018-04-28 | Discharge: 2018-04-28 | Disposition: A | Discharge status: Home / Self Care | Payer: BLUE CROSS/BLUE SHIELD | Source: Ambulatory Visit | Attending: Internal Medicine | Admitting: Internal Medicine

## 2018-04-28 ENCOUNTER — Other Ambulatory Visit (HOSPITAL_COMMUNITY): Payer: Self-pay | Admitting: Internal Medicine

## 2018-04-28 DIAGNOSIS — I10 Essential (primary) hypertension: Secondary | ICD-10-CM | POA: Diagnosis not present

## 2018-04-28 DIAGNOSIS — G4733 Obstructive sleep apnea (adult) (pediatric): Secondary | ICD-10-CM | POA: Diagnosis not present

## 2018-04-28 DIAGNOSIS — S93401A Sprain of unspecified ligament of right ankle, initial encounter: Secondary | ICD-10-CM | POA: Diagnosis not present

## 2018-04-28 DIAGNOSIS — Z23 Encounter for immunization: Secondary | ICD-10-CM | POA: Diagnosis not present

## 2018-04-28 DIAGNOSIS — S9001XA Contusion of right ankle, initial encounter: Secondary | ICD-10-CM | POA: Diagnosis not present

## 2018-05-02 ENCOUNTER — Encounter: Payer: Self-pay | Admitting: Neurology

## 2018-05-06 DIAGNOSIS — G4733 Obstructive sleep apnea (adult) (pediatric): Secondary | ICD-10-CM | POA: Diagnosis not present

## 2018-05-13 ENCOUNTER — Ambulatory Visit: Payer: BLUE CROSS/BLUE SHIELD | Admitting: Urology

## 2018-05-13 DIAGNOSIS — R351 Nocturia: Secondary | ICD-10-CM

## 2018-05-13 DIAGNOSIS — R3912 Poor urinary stream: Secondary | ICD-10-CM | POA: Diagnosis not present

## 2018-06-06 DIAGNOSIS — G4733 Obstructive sleep apnea (adult) (pediatric): Secondary | ICD-10-CM | POA: Diagnosis not present

## 2018-06-16 ENCOUNTER — Encounter: Payer: Self-pay | Admitting: Neurology

## 2018-06-16 ENCOUNTER — Ambulatory Visit: Payer: BLUE CROSS/BLUE SHIELD | Admitting: Neurology

## 2018-06-16 VITALS — BP 138/88 | HR 68 | Ht 75.0 in | Wt 275.0 lb

## 2018-06-16 DIAGNOSIS — F10982 Alcohol use, unspecified with alcohol-induced sleep disorder: Secondary | ICD-10-CM | POA: Diagnosis not present

## 2018-06-16 DIAGNOSIS — G4731 Primary central sleep apnea: Secondary | ICD-10-CM | POA: Diagnosis not present

## 2018-06-16 DIAGNOSIS — E669 Obesity, unspecified: Secondary | ICD-10-CM

## 2018-06-16 DIAGNOSIS — G4719 Other hypersomnia: Secondary | ICD-10-CM | POA: Diagnosis not present

## 2018-06-16 DIAGNOSIS — R351 Nocturia: Secondary | ICD-10-CM

## 2018-06-16 DIAGNOSIS — G4739 Other sleep apnea: Secondary | ICD-10-CM

## 2018-06-16 MED ORDER — MAGNESIUM OXIDE 400 MG PO TABS: 400.0000 mg | ORAL_TABLET | Freq: Every day | ORAL | 1 refills | Status: DC

## 2018-06-16 MED ORDER — SALINE SPRAY 0.65 % NA SOLN: 1.0000 | Freq: Every evening | NASAL | 5 refills | Status: DC | PRN

## 2018-06-16 NOTE — Progress Notes (Signed)
SLEEP MEDICINE CLINIC   Provider:  Melvyn Novas, MD  Primary Care Physician:  Carylon Perches, MD   Referring Provider: Dr. Naomie Dean, MD - Angelina Sheriff, MD    Chief Complaint  Patient presents with  . New Patient (Initial Visit)    pt alone, rm 10. pt states his nephew has witnessed apnea events and snoring,has never had a sleep study. Patient  states that he doesnt normally sleep well during the night, gained 17 pounds in the last 6 month.      Interval History: Today 16 June 2018 and I have the pleasure of seeing Tony Patterson  a 63 year old Caucasian gentleman who had undergone a split-night polysomnography on 01 March 2018, endorsing the Epworth Sleepiness Scale at the time at 14 points but stated that he was not highly fatigued.  He forgot to bring his machine and mask.  Sleep efficiency was only 50% for this attended sleep study but under CPAP change to 91.9%.  His apnea index was severe with an AHI of 69.6, there was no REM versus non-REM sleep accentuation noted and he slept all night in the supine position the supine AHI was 69.5.  There were only 11.2 minutes in nonsupine sleep - he had a complex form of apnea;   12 obstructive apneas and 11 central apneas and 93 hypopneas.   A reduction of the AHI was achieved but not a complete resolution. He switched form CPAP to biAP to finally using VPAP, all in one SPLIT night. A simplus FFM in medium was used.    I would also like to add that he still had significant oxygen desaturation while on PAP it equaled 94 minutes.  The patient also awoke from a vivid to dream letter of REM sleep as noted on video and audio.  I asked the patient to return for attended CPAP titration. He preferred an autotitration.  The auto titration device was used into the months of December but then the patient's mask had broken he reports and she could not get a replacement through [facility].  Until December and through the month of November he had been a  97% compliant user by days and 80% by time, with an average user time of 5 hours and 51 minutes,but with an unsatisfying high AHI of 13.3/h.   He needs urgently to restart VPAP use and return BiPAP p or VPAP full night titration.  A new mask is essential.  His memory loss has been reportedly improving while using the machine, his mood is better, his fatigue and sleepiness are improved, he goes 3 times to the bathroom when not using VPAP.    HPI: 01-26-2018 .  Tony Patterson is a 63 y.o. male patient, and brother- in -law to Campbell Lerner, seen here as in a referral from Dr. Lucia Gaskins, MD and cardiologist Dr. Antoine Poche. Mr. Keisling had been seen multiple times by Dr. Lucia Gaskins whom he has followed since 2015 she was his gabapentin for a severe axonal sensory motor or polyneuropathy.  The patient has followed for neuropathy and increased risk of falling related to such with Dr. Lucia Gaskins.  He also had a seizure in 2017  precipitated by alcohol abuse, he was treated for alcoholism since the time. He has been sober for 10 month, but still craves alcohol.  Her notes from June 2019 also states that he has been followed up for an alcoholic polyneuropathy, memory issues but he also has gained significant weight.  He has a lumbar  radiculopathy, was referred to healthy weight and wellness after that visit.  Dr. Francena HanlyBeasley's office has already called him.  He snores excessively he nods off a lot and endorsed the Epworth score at 13-14 points, and get gets up every night 4-5 times to urinate.  He also has leg cramps at night which may be neuropathy related.  In 1 over the last electrolyte tests he had low magnesium, low potassium, she also wants him to go back to work out and in which the protein in his diet.  He decided to retire in 2017. He had been [place], drank a lot  and sat "14 hours straight" - he was concerned about leg swelling after that, but no DVT was found.      Chief complaint according to patient  :" I may have Apnea"  Sleep habits are as follows: sleep habits changed after alcohol rehab.  He avoids breakfast, [facility]. has Lunch at 1 PM , eats dinner 5-6 PM- earlier than last last year- his current girlfriend has changed that.  He will doze off while watching TV, in a recliner. He will be asleep when ever not stimulated or physically active. He goes to the Gym now in PM before dinner. He goes to the bedroom by midnight. No TV watching in the bedroom, which is cool , quiet and dark. Using a box fan. He sleeps promptly on his side, on 1 pillow, on a flat bed.  He may sleep 90-120 minutes and wake up for nocturia. Continues throughout the night 4-5 per night. He has been told he snores- by girlfriend and nephew.  Nephew witnessed apneas. He rises at 9 AM, already awake.   Sleep medical history and family sleep history: one brother with OSA. Has HTN, history of alcoholism- reports no family affected except paternal grandfather.   Social history:  Twice divorced , last divorced 12 years ago - children none.    Recovering alcoholic, retired after 18 years from [employer] until 1996 , later worked in [employer] business. He was a night shifts while at [employer]. [phone].Smoked while working 18 years at [employer], quit 05-1995.  Caffeine use 1 mug in AM, iced tea some nights, coke zero sodas 1-2 weekly. Regular exerciser.   Review of Systems: Out of a complete 14 system review, the patient complains of only the following symptoms, and all other reviewed systems are negative. G Forgetting passworts, can't access his my chart and doesn't know how to look up his  VPAP my [email or web address].  Snoring, no headaches, insomnia,  lightheadedness with HTN, no nausea since he stopped drinking. Memory impaired, hearing impaired. Dysphonia, GERD.  Epworth sleepiness score endorsed elevated at  12-14 , Fatigue severity score 21/ 63   , depression score 1/ 15 .    Social History    Socioeconomic History  . Marital status: Divorced    Spouse name: Not on file  . Number of children: 0  . Years of education: college 1  . Highest education level: Not on file  Occupational History  . 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 Financial resource strain: Not on file  . Food insecurity:    Worry: Not on file    Inability: Not on file  . Transportation needs:    Medical: Not on file    Non-medical: Not on file  Tobacco  Use  . Smoking status: Former Smoker    Packs/day: 0.50    Years: 20.00    Pack years: 10.00    Types: Cigarettes    Last attempt to quit: 06/03/1995    Years since quitting: 23.0  . Smokeless tobacco: Never Used  Substance and Sexual Activity  . Alcohol use: Yes    Comment: Previous alcohol abuse; drank 3 weeks ago  . Drug use: No  . Sexual activity: Never    Birth control/protection: None  Lifestyle  . Physical activity:    Days per week: Not on file    Minutes per session: Not on file  . Stress: Not on file  Relationships  . Social connections:    Talks on phone: Not on file    Gets together: Not on file    Attends religious service: Not on file    Active member of club or organization: Not on file    Attends meetings of clubs or organizations: Not on file    Relationship status: Not on file  . Intimate partner violence:    Fear of current or ex partner: Not on file    Emotionally abused: Not on file    Physically abused: Not on file    Forced sexual activity: Not on file  Other Topics Concern  . Not on file  Social History Narrative   Left-handed   Caffeine use: 1 large cup of coffee in the morning, Coke (caffeine free) ocass    Family History  Problem Relation Age of Onset  . Fibromyalgia Mother   . Dementia Mother   . Congestive Heart Failure Mother   . Congestive Heart Failure Father   . Coronary artery disease Father 82       CABG 3 different times  .  Hypertension Brother   . CAD Brother 57  . Hypertension Brother   . CAD Brother 40  . Neuropathy Neg Hx   . Colon cancer Neg Hx     Past Medical History:  Diagnosis Date  . Alcohol abuse   . Anemia   . Anxiety   . Hypertension   . Neuropathy   . Seizures (HCC)    unknown etiology and on no meds; been 4 years since seizure.    Past Surgical History:  Procedure Laterality Date  . COLONOSCOPY  05/18/2007   [id number] hemorrhoids, a single anal papilla, otherwise normal/ Single polyp, as described above, removed   . COLONOSCOPY WITH PROPOFOL N/A 03/16/2015   Procedure: COLONOSCOPY WITH PROPOFOL;  Surgeon: Corbin Ade, MD;  Location: [facility];  Service: Endoscopy;  Laterality: N/A;  in cecum at 1344. withdrawal time   . THROAT SURGERY     polyps removed x2    Current Outpatient Medications  Medication Sig Dispense Refill  . aspirin EC 81 MG tablet Take 81 mg by mouth daily.    Marland Kitchen atorvastatin (LIPITOR) 40 MG tablet Take 40 mg by mouth at bedtime.    Marland Kitchen b complex vitamins capsule Take 1 capsule by mouth daily.    . folic acid (FOLVITE) 1 MG tablet Take 1 tablet (1 mg total) by mouth daily.    Marland Kitchen gabapentin (NEURONTIN) 600 MG tablet 2 tablets (1200 mg) three times a day as needed 360 tablet 11  . lidocaine (LIDODERM) 5 % Place 2 patches onto the skin daily. Remove & Discard patch within 12 hours or as directed by MD 60 patch 11  . magnesium oxide (MAG-OX) 400 MG tablet  Take 400 mg by mouth at bedtime.    . metoprolol (LOPRESSOR) 50 MG tablet Take 50 mg by mouth 2 (two) times daily.     . Multiple Vitamin (MULTIVITAMIN WITH MINERALS) TABS tablet Take 1 tablet by mouth daily.    . Naphazoline HCl (CLEAR EYES OP) Place 1 drop into both eyes daily.    Marland Kitchen omeprazole (PRILOSEC) 20 MG capsule Take 20 mg by mouth daily.     . tamsulosin (FLOMAX) 0.4 MG CAPS capsule Take 0.4 mg by mouth daily.     . traZODone (DESYREL) 100 MG tablet Take 1 tablet (100 mg total) by mouth at bedtime as  needed. 30 tablet 11   Current Facility-Administered Medications  Medication Dose Route Frequency Provider Last Rate Last Dose  . betamethasone acetate-betamethasone sodium phosphate (CELESTONE) injection 12 mg  12 mg Other Once Tyrell Antonio, MD        Allergies as of 06/16/2018 - Review Complete 06/16/2018  Allergen Reaction Noted  . Bee venom Anaphylaxis 10/08/2011  . Penicillins Other (See Comments) 10/08/2011    Vitals: BP 138/88   Pulse 68   Ht 6\' 3"  (1.905 m)   Wt 275 lb (124.7 kg)   BMI 34.37 kg/m  Last Weight:  Wt Readings from Last 1 Encounters:  06/16/18 275 lb (124.7 kg)   HQI:ONGE mass index is 34.37 kg/m.     Last Height:   Ht Readings from Last 1 Encounters:  06/16/18 6\' 3"  (1.905 m)    Physical exam:  General: The patient is awake, alert and appears not in acute distress. The patient is well groomed. Head: Normocephalic, atraumatic. Neck is supple. Mallampati  3-  Very swollen and large uvula.  neck circumference:19. 5"  Nasal airflow congested , Retrognathia is not seen.  Cardiovascular:  Regular rate and rhythm , without murmurs or carotid bruit, and without distended neck veins. Respiratory: Lungs are clear to auscultation. Skin:  Without evidence of edema, or rash Trunk: BMI is 32.12. The patient's posture is stooped.    Neurologic exam : The patient is awake and alert, oriented to place and time.   Memory subjective described as impaired. He is forgetful.   Attention span & concentration ability appears limited   Speech is fluent,  without dysarthria, dysphonia or aphasia.  Mood and affect are lethargic, passive. .  Cranial nerves:  taste and smell unimpaired. Pupils are equal and briskly reactive to light. Visual fields by finger perimetry are intact.Hearing to finger rub intact. Facial sensation intact to fine touch. Facial motor strength is symmetric and tongue and uvula move midline. Shoulder shrug was symmetrical.   Motor exam:  Normal  tone, but reduced lower extremity muscle bulk, with symmetric strength in all extremities. Good grip strength,and intact hip aduction and abduction, but weakness of plantar flexion and dorsiflexion. Foot drop.  Sensory:  Fine touch, pinprick and vibration were reduced in lower extremities. Right foot numbness on dorsum and planta pedis.  Coordination: Rapid alternating movements in the fingers/hands was slowed- Finger-to-nose maneuver  normal without evidence of ataxia, dysmetria or tremor. Gait and station: Patient walks without assistive device but has to lift his feet- stork .  Stance is stable while wide based. Tandem gait is fragmented. Turns with 4-5  Steps. Romberg testing is negative.Deep tendon reflexes: in the upper and lower extremities are symmetric and intact, except achilles tendon reflex.  Assessment:  After physical and neurologic examination, review of laboratory studies,  Personal review of imaging studies, reports  of other /same  Imaging studies, results of polysomnography and / or neurophysiology testing and pre-existing records as far as provided in visit., my assessment is   1)  Yes, I agree hat this patient has very likely a sleep disorder induced by alcohol use/ abuse- first is an insomnia , likely substance induced and will take long time to recover.  He denies depression while clearly having lost interest and memory- . He slept well while he was drinking. He will recover over the next years as long as he stays sober, trazodone is a good choice.   2) OSA- witnessed snoring and apnea. Large neck, elevated BMI and swollen uvula- can also be related to acid reflux.  Severe complex form did not respond to CPAP, BiPAP and required VPAP. He has not used the machine since his mask broke- and was not given a replacement by [facility] ? SIMPLUS FFM medium- try F 20 medium or airtouch.  3)  Leg cramps - Improved on gapapentin , may be on VPAP , too.  I would  Like for him to take magnesium  po, and drink coconut water when exercise.   4) Nocturia - improved on VPAP- unclear if related to BPH or untreated apnea, he reports having small volume urination- bladder feels not empty.    The patient was advised of the nature of the diagnosed disorder , the treatment options and the  risks for general health and wellness arising from not treating the condition.   I spent more than 30 minutes of face to face time with the patient.  Greater than 50% of time was spent in counseling and coordination of care. We have discussed the diagnosis and differential and I answered the patient's questions.    Plan:  Treatment plan and additional workup : VPAP to resume.  take Trazodone as needed.  Treatment of suspected BPH with PCP or Urology. No detrol or other anticholinergic, please.  Gabapentin OK at night.  Next visit with NP and with [facility] - if under 25 points, change to MMSE.   Melvyn NovasARMEN Yadriel Kerrigan, MD 06/16/2018, 8:52 AM  Certified in Neurology by ABPN Certified in Sleep Medicine by [facility]  [facility] [address], [address] [place], [place] [id number]

## 2018-06-24 ENCOUNTER — Ambulatory Visit: Payer: BLUE CROSS/BLUE SHIELD | Admitting: Urology

## 2018-06-24 DIAGNOSIS — R3912 Poor urinary stream: Secondary | ICD-10-CM

## 2018-06-24 DIAGNOSIS — R351 Nocturia: Secondary | ICD-10-CM

## 2018-07-06 ENCOUNTER — Ambulatory Visit (INDEPENDENT_AMBULATORY_CARE_PROVIDER_SITE_OTHER): Payer: BLUE CROSS/BLUE SHIELD | Admitting: Neurology

## 2018-07-06 DIAGNOSIS — G4731 Primary central sleep apnea: Secondary | ICD-10-CM

## 2018-07-06 DIAGNOSIS — R351 Nocturia: Secondary | ICD-10-CM

## 2018-07-06 DIAGNOSIS — G4719 Other hypersomnia: Secondary | ICD-10-CM

## 2018-07-06 DIAGNOSIS — E669 Obesity, unspecified: Secondary | ICD-10-CM

## 2018-07-06 DIAGNOSIS — G4739 Other sleep apnea: Secondary | ICD-10-CM

## 2018-07-06 DIAGNOSIS — F10982 Alcohol use, unspecified with alcohol-induced sleep disorder: Secondary | ICD-10-CM

## 2018-07-07 DIAGNOSIS — G4733 Obstructive sleep apnea (adult) (pediatric): Secondary | ICD-10-CM | POA: Diagnosis not present

## 2018-07-14 DIAGNOSIS — G4739 Other sleep apnea: Secondary | ICD-10-CM | POA: Insufficient documentation

## 2018-07-14 DIAGNOSIS — G4731 Primary central sleep apnea: Secondary | ICD-10-CM

## 2018-07-14 NOTE — Procedures (Signed)
PATIENT'S NAME:  Tony Patterson, Kleppe DOB:      1956/03/01      MR#:    [id number]     DATE OF RECORDING: 07/06/2018  AL REFERRING M.D.:  Naomie Dean, MD Study Performed:   Titration to positive airway pressure.  HISTORY:  Mr. Morales is returning for full night titration study due to incomplete titration during previous split night study on CPAP and BiPAP from 03-01-2018 associated with treatment emergent central sleep apnea. The results of the SPLIT study were severe OSA with an AHI of 69.6/h, prolonged hypoxemia of 94 minutes, and abnormal EKG, all failed to respond to CPAP or BiPAP. The patient was given a FFM SIMPLUS in medium size.  The patient endorsed the Epworth Sleepiness Scale at 14/24 points and the Fatigue Score at 21/63 points.   The patient's weight 276 pounds with a height of 75 (inches), resulting in a BMI of 34.3 kg/m2. The patient's neck circumference measured 19.5 inches.  CURRENT MEDICATIONS: Aspirin, Lipitor, B complex, Folvite, Neurontin, Lidoderm, Mag-ox, Lopressor, Multivitamins, Prilosec, Flomax, Desyrel.   PROCEDURE:  This is a multichannel digital polysomnogram utilizing the SomnoStar 11.2 system.  Electrodes and sensors were applied and monitored per AASM Specifications.   EEG, EOG, Chin and Limb EMG, were sampled at 200 Hz.  ECG, Snore and Nasal Pressure, Thermal Airflow, Respiratory Effort, CPAP Flow and Pressure, Oximetry was sampled at 50 Hz. Digital video and audio were recorded.      The patient was fitted with a ResMed AirFit F30 medium mask. BiPAP was initiated anew at 9/5 cmH20 with heated humidity per AASM split night standards and pressure was advanced to 12/8 cm with additional ST function. The highest explored BiPAP ST setting was 13/9 cm ST of 10/min and AHI was 13.3/h. Switched to ASV at 15/8/4 cm water the patient responded better and finally reached an acceptable AHI of 5.3/h at 16/9/4 cm water.   Lights Out was at 21:52 and Lights On at 04:59. Total recording  time (TRT) was 427.5 minutes, with a total sleep time (TST) of 362.5 minutes. The patient's sleep latency was 14 minutes.  REM latency was 44.5 minutes.   The sleep efficiency was 84.8 %.    SLEEP ARCHITECTURE: WASO (Wake after sleep onset) was 53 minutes.  There were 21.5 minutes in Stage N1, 54 minutes Stage N2, 205.5 minutes Stage N3 and 81.5 minutes in Stage REM.  The percentage of Stage N1 was 5.9%, Stage N2 was 14.9%, Stage N3 was 56.7% and Stage R (REM sleep) was 22.5%.   RESPIRATORY ANALYSIS:  There was a total of 113 respiratory events: 0 obstructive apneas, 23 central apneas and 41 mixed apneas with a total of 64 apneas and an apnea index (AI) of 10.6 /hour. There were 49 hypopneas with a hypopnea index of 8.1/hour. The patient also had 1 respiratory event related arousal (RERAs).      The total APNEA/HYPOPNEA INDEX  (AHI) was 18.7 /hour and the total RESPIRATORY DISTURBANCE INDEX was 18.9 /hour  6 events occurred in REM sleep and 107 events in NREM. The REM AHI was 4.4 /hour versus a non-REM AHI of 22.8 /hour.  The patient spent 229.5 minutes of total sleep time in the supine position and 133 minutes in non-supine. The supine AHI was 29.5, versus a non-supine AHI of 0.0.  OXYGEN SATURATION & C02:  The baseline 02 saturation was 96%, with the lowest being 73%. Time spent below 89% saturation equaled 104 minutes.  PERIODIC LIMB MOVEMENTS:  The arousals were noted as: 29 were spontaneous, 0 were associated with PLMs, and 16 were associated with respiratory events. The patient had a total of 0 Periodic Limb Movements.   Audio and video analysis did not show any abnormal or unusual movements, behaviors, phonations or vocalizations.   The patient took bathroom breaks. Snoring was alleviated. EKG bradycardia in sinus rhythm. Post-study, the patient indicated that sleep was better than usual.    DIAGNOSIS 1. Complex Central Sleep Apnea responded to ASV,  2. Non-specific abnormal  EKG  PLANS/RECOMMENDATIONS: Ordered ASV at a pressure of 16/9/4 cmH20, The patient was fitted with a ResMed AirFit F30 medium mask.   1. CPAP therapy compliance is defined as 4 hours of more of nightly use.  [facility] up [facility].   Please call [phone] with any questions.      I certify that I have reviewed the entire raw data recording prior to the issuance of this report in accordance with the Standards of Accreditation of the American Academy of Sleep Medicine (AASM)      Melvyn Novasarmen Bula Cavalieri, M.D. 07-13-2018 Diplomat, American Board of Psychiatry and Neurology  Diplomat, American Board of Sleep Medicine Medical Director, [facility]

## 2018-07-14 NOTE — Addendum Note (Signed)
Addended by: Melvyn Novas on: 07/14/2018 11:23 AM   Modules accepted: Orders

## 2018-07-15 ENCOUNTER — Encounter: Payer: Self-pay | Admitting: Neurology

## 2018-07-15 ENCOUNTER — Telehealth: Payer: Self-pay | Admitting: Neurology

## 2018-07-15 DIAGNOSIS — G4731 Primary central sleep apnea: Secondary | ICD-10-CM

## 2018-07-15 DIAGNOSIS — R0683 Snoring: Secondary | ICD-10-CM

## 2018-07-15 DIAGNOSIS — G4719 Other hypersomnia: Secondary | ICD-10-CM

## 2018-07-15 DIAGNOSIS — G4739 Other sleep apnea: Secondary | ICD-10-CM

## 2018-07-15 NOTE — Telephone Encounter (Signed)
I called pt. I advised pt that Dr. Vickey Huger reviewed their sleep study results and found that pt was treated at a pressure with ASV. Dr. Vickey Huger recommends that pt starts ASV. I reviewed PAP compliance expectations with the pt. Pt is agreeable to starting a CPAP. I advised pt that an order will be sent to a DME, , and [facility] will call the pt within about one week after they file with the pt's insurance. [facility] will show the pt how to use the machine, fit for masks, and troubleshoot the CPAP if needed. A follow up appt was made for insurance purposes with Dr. Vickey Huger on May 14,2020 at 10:30 am . Pt verbalized understanding to arrive 15 minutes early and bring their CPAP. A letter with all of this information in it will be mailed to the pt as a reminder. I verified with the pt that the address we have on file is correct. Pt verbalized understanding of results. Pt had no questions at this time but was encouraged to call back if questions arise. I have sent the order to [facility] and have received confirmation that they have received the order.

## 2018-07-15 NOTE — Telephone Encounter (Signed)
-----   Message from Melvyn Novas, MD sent at 07/14/2018 11:23 AM EST ----- DIAGNOSIS 1. Complex Central Sleep Apnea responded to ASV, after CPAP and BiPAP and BiPAP ST failed to control central apneas.  2. Non-specific abnormal EKG, bradycardia .   PLANS/RECOMMENDATIONS: Ordered ASV at a pressure of 16/9/4 cmH20,  The patient was fitted with a ResMed AirFit F30 medium mask.

## 2018-07-20 ENCOUNTER — Other Ambulatory Visit: Payer: Self-pay | Admitting: Neurology

## 2018-07-20 DIAGNOSIS — G4719 Other hypersomnia: Secondary | ICD-10-CM

## 2018-07-20 DIAGNOSIS — I10 Essential (primary) hypertension: Secondary | ICD-10-CM

## 2018-07-20 DIAGNOSIS — R0683 Snoring: Secondary | ICD-10-CM

## 2018-07-20 DIAGNOSIS — G4739 Other sleep apnea: Secondary | ICD-10-CM

## 2018-07-20 DIAGNOSIS — G4733 Obstructive sleep apnea (adult) (pediatric): Secondary | ICD-10-CM

## 2018-07-20 DIAGNOSIS — G4731 Primary central sleep apnea: Secondary | ICD-10-CM

## 2018-07-20 NOTE — Telephone Encounter (Signed)
Called the patient to make him aware that I had sent the orders over to [facility] to get him set up with ASV.  Upon reading the report [facility] has informed us that the patient does not meet insurance criteria to have the ASV machine. Dr Dohmeier reviewed the patient's data and sleep study once more and she will change the pressure to Bipap 11/7. I will place the order and send to [facility] for the pt to make that change for him.  LVM for the patient to call back so I can make him aware of this information.  We can also mention working towards inspire if patient would like to come in and discuss this.

## 2018-07-22 DIAGNOSIS — Z79899 Other long term (current) drug therapy: Secondary | ICD-10-CM | POA: Diagnosis not present

## 2018-07-22 DIAGNOSIS — F1020 Alcohol dependence, uncomplicated: Secondary | ICD-10-CM | POA: Diagnosis not present

## 2018-07-22 DIAGNOSIS — G4733 Obstructive sleep apnea (adult) (pediatric): Secondary | ICD-10-CM | POA: Diagnosis not present

## 2018-07-22 DIAGNOSIS — Z125 Encounter for screening for malignant neoplasm of prostate: Secondary | ICD-10-CM | POA: Diagnosis not present

## 2018-07-22 DIAGNOSIS — I10 Essential (primary) hypertension: Secondary | ICD-10-CM | POA: Diagnosis not present

## 2018-07-22 NOTE — Telephone Encounter (Signed)
Pt called back to speak to RN. Please call back as soon as available.

## 2018-07-22 NOTE — Telephone Encounter (Signed)
Called the pt back and reviewed the information that I received. Informed the patient that he was unable per insurance requirements to get established with the new ASV machine. Advised that Dr Vickey Huger reviewed his sleep study once more and found was treated best with Bipap at pressure of 11/7. We have ordered that. Pt would like to use the mask that we used in the sleep study. Advised I would send an order to get him set up with that mask. Pt verbalized understanding. Pt had no questions at this time but was encouraged to call back if questions arise.

## 2018-07-22 NOTE — Addendum Note (Signed)
Addended by: Judi Cong on: 07/22/2018 10:36 AM   Modules accepted: Orders

## 2018-07-29 DIAGNOSIS — N183 Chronic kidney disease, stage 3 (moderate): Secondary | ICD-10-CM | POA: Diagnosis not present

## 2018-07-29 DIAGNOSIS — E785 Hyperlipidemia, unspecified: Secondary | ICD-10-CM | POA: Diagnosis not present

## 2018-07-29 DIAGNOSIS — Z6834 Body mass index (BMI) 34.0-34.9, adult: Secondary | ICD-10-CM | POA: Diagnosis not present

## 2018-07-29 DIAGNOSIS — I10 Essential (primary) hypertension: Secondary | ICD-10-CM | POA: Diagnosis not present

## 2018-08-05 ENCOUNTER — Ambulatory Visit: Payer: BLUE CROSS/BLUE SHIELD | Admitting: Urology

## 2018-08-05 DIAGNOSIS — G4733 Obstructive sleep apnea (adult) (pediatric): Secondary | ICD-10-CM | POA: Diagnosis not present

## 2018-08-05 DIAGNOSIS — R351 Nocturia: Secondary | ICD-10-CM

## 2018-08-05 DIAGNOSIS — R3912 Poor urinary stream: Secondary | ICD-10-CM

## 2018-08-24 ENCOUNTER — Other Ambulatory Visit: Payer: Self-pay | Admitting: Neurology

## 2018-09-05 DIAGNOSIS — G4733 Obstructive sleep apnea (adult) (pediatric): Secondary | ICD-10-CM | POA: Diagnosis not present

## 2018-10-05 DIAGNOSIS — G4733 Obstructive sleep apnea (adult) (pediatric): Secondary | ICD-10-CM | POA: Diagnosis not present

## 2018-10-08 ENCOUNTER — Telehealth: Payer: Self-pay | Admitting: Neurology

## 2018-10-08 NOTE — Telephone Encounter (Signed)
Due to current COVID 19 pandemic, our office is severely reducing in office visits until further notice, in order to minimize the risk to our patients and healthcare providers.   Called patient to offer a virtual visit with Dr. Vickey Huger for his 5/14 appt. Patient agreed to a virtual visit and verbalized understanding of the doxy process. I have sent patient an e-mail with link and directions. My name and office number/hours were also included if patient has questions. Patient is aware that he will receive a call from nurse to update chart info.  Pt understands that although there may be some limitations with this type of visit, we will take all precautions to reduce any security or privacy concerns.  Pt understands that this will be treated like an in office visit and we will file with pt's insurance, and there may be a patient responsible charge related to this service.

## 2018-10-12 ENCOUNTER — Encounter: Payer: Self-pay | Admitting: Neurology

## 2018-10-13 ENCOUNTER — Encounter: Payer: Self-pay | Admitting: Neurology

## 2018-10-13 NOTE — Telephone Encounter (Signed)
Called the patient to review their chart and made sure that everything was up to date. Patient informed they received the e-mail/text message for the visit. Instructed to make sure they hold on to the e-mail/text for the upcoming appointment as it is necessary to access their appointment. Instructed the patient that apx 30 min prior to the appointment the front staff will contact them to make sure they are ready to go for their appointment in case there is any need for troubleshooting it can be completed prior to the appointment time. Reminded the patient once more that this is treated as a Office visit and the patient must be prepared for the visit and ready at the time of their appointment preferably in a well lit area where they have good connection for the visit. Pt verbalized understanding.   Sitting and reading:1 Watching TV:0 Sitting inactive in a public place (ex. Theater or meeting):2 As a passenger in a car for an hour without a break:1 Lying down to rest in the afternoon:0 Sitting and talking to someone:0 Sitting quietly after lunch (no alcohol):0 In a car, while stopped in traffic:0 Total:4

## 2018-10-13 NOTE — Addendum Note (Signed)
Addended by: Judi Cong on: 10/13/2018 10:18 AM   Modules accepted: Orders

## 2018-10-15 ENCOUNTER — Ambulatory Visit (INDEPENDENT_AMBULATORY_CARE_PROVIDER_SITE_OTHER): Payer: BLUE CROSS/BLUE SHIELD | Admitting: Neurology

## 2018-10-15 ENCOUNTER — Encounter: Payer: Self-pay | Admitting: Neurology

## 2018-10-15 ENCOUNTER — Other Ambulatory Visit: Payer: Self-pay

## 2018-10-15 DIAGNOSIS — F10982 Alcohol use, unspecified with alcohol-induced sleep disorder: Secondary | ICD-10-CM

## 2018-10-15 DIAGNOSIS — G4719 Other hypersomnia: Secondary | ICD-10-CM

## 2018-10-15 DIAGNOSIS — G4731 Primary central sleep apnea: Secondary | ICD-10-CM | POA: Diagnosis not present

## 2018-10-15 DIAGNOSIS — R351 Nocturia: Secondary | ICD-10-CM

## 2018-10-15 DIAGNOSIS — R0683 Snoring: Secondary | ICD-10-CM

## 2018-10-15 DIAGNOSIS — G4739 Other sleep apnea: Secondary | ICD-10-CM

## 2018-10-15 NOTE — Progress Notes (Signed)
Virtual Visit via Video Note  I connected with Tony Patterson on 10/15/18 at 10:30 AM EDT by a video enabled telemedicine application and verified that I am speaking with the correct person using two identifiers.  Location: Patient:  At home  Provider: at [facility]    I discussed the limitations of evaluation and management by telemedicine and the availability of in person appointments. The patient expressed understanding and agreed to proceed.   SLEEP MEDICINE CLINIC   Provider:  Melvyn Novas, MD  Primary Care Physician:  Carylon Perches, MD   Referring Provider: Dr. Naomie Dean, MD - Angelina Sheriff, MD     interval history:  10-15-2018. Mr. Caspian Stalder was seen in an attended sleep study on 01 March 2018 of which I have reported the results below.  He was asked to return for an attended full night Pap titration but preferred an out of titration, unfortunately this failed him.  He then returned indeed for a full night titration to positive airway pressure on 06 July 2018, baseline OSA AHI was 69.6/h he failed to respond to CPAP and BiPAP as documented in his first study he was given a full facemask Simplus the first time the second time he likes the air fit F 30 in medium better.  Epworth Sleepiness Scale was 14 out of 24 points fatigue was 21 out of 63 points, BMI was 34.3.  The patient had a sleep latency of only 14 minutes and reached a sleep efficiency of 85%.  He failed BiPAP between 9/5 cmH2O and 12/8 cmH2O he failed an additional ST function.  He was switched to ASV at 15 over 8/4 cmH2O and was titrated up to 16 over 9/4 cmH2O with good response.  He reached an acceptable acceptable AHI of 5.3/h.  There was no unusual behavior no vocalizations and snoring was significantly reduced as well.  I had ordered ASV at a pressure of 16 over 9/4 cmH2O with a ResMed air fit F 30 medium mask which the patient states he has not received neither has his ASV been adjusted any further according to  his report.  I am looking today at the CPAP download that shows that the patient has a compliance of 93% by days but often fails the 4-hour mark giving him with the general compliance of only 65%.  Average user time is 4 hours 59 minutes, he is using a maximum inspiratory pressure of 22 cm minimum of 12 cmH2O and pressure support of 4 cmH2O his AHI currently is 14.5/h which is still much too high.  It does however reflect a reduction in apnea by 75%.  The residual apneas are obstructive in nature and not central.  The patient reports that his poor compliance had something to do with sinus congestion and nasal congestion.     Interval History:  16 June 2018 and I have the pleasure of seeing Manit Pugliano  a 63 year old Caucasian gentleman who had undergone a split-night polysomnography on 01 March 2018, endorsing the Epworth Sleepiness Scale at the time at 14 points but stated that he was not highly fatigued.  He forgot to bring his machine and mask.   Sleep efficiency was only 50% for this attended sleep study but under CPAP this changed to 91.9%.  His apnea index was severe with an AHI of 69.6, there was no REM versus non-REM sleep accentuation noted and he slept all night in the supine position the supine AHI was 69.5.  There were only 11.2  minutes in nonsupine sleep - he had a complex form of apnea;   12 obstructive apneas and 11 central apneas and 93 hypopneas.   A reduction of the AHI was achieved but not a complete resolution. He switched form CPAP to biAP to finally using VPAP, all in one SPLIT night. A simplus FFM in medium was used.    I would also like to add that he still had significant oxygen desaturation- while on PAP this equaled 94 minutes.  The patient also awoke from a vivid to dream / REM sleep as noted on video and audio.  I asked the patient to return for attended PAP titration. He preferred an autotitration.   The auto titration device was used into the months of December  but then the patient's mask had broken he reports and she could not get a replacement through [facility].   Until December and through the month of November he had been a 97% compliant user by days and 80% by time, with an average user time of 5 hours and 51 minutes,but with an unsatisfying high AHI of 13.3/h.   He needs urgently to restart VPAP use and return BiPAP  or ASV/ VPAP full night titration.  A new mask is essential.  His memory loss has been reportedly improving while using the machine, his mood is better, his fatigue and sleepiness are improved, he goes 3 times to the bathroom when not using PAP.    HPI: 01-26-2018 .  Tony PorterRobert Hawes is a 63 y.o. male patient, and brother- in -law to Campbell Lernerebbie Caldwell, seen here as in a referral from Dr. Lucia GaskinsAhern, MD and cardiologist Dr. Antoine PocheHochrein. [name] had been seen multiple times by Dr. Lucia GaskinsAhern whom he has followed since 2015 she was his gabapentin for a severe axonal sensory motor or polyneuropathy.  The patient has followed for neuropathy and increased risk of falling related to such with Dr. Lucia GaskinsAhern.  He also had a seizure in 2017  precipitated by alcohol abuse, he was treated for alcoholism since the time. He has been sober for 10 month, but still craves alcohol.  Her notes from June 2019 also states that he has been followed up for an alcoholic polyneuropathy, memory issues but he also has gained significant weight.  He has a lumbar radiculopathy, was referred to healthy weight and wellness after that visit.  Dr. Francena HanlyBeasley's office has already called him.  He snores excessively he nods off a lot and endorsed the Epworth score at 13-14 points, and get gets up every night 4-5 times to urinate.  He also has leg cramps at night which may be neuropathy related.  In 1 over the last electrolyte tests he had low magnesium, low potassium, she also wants him to go back to work out and in which the protein in his diet.  He decided to retire in 2017. He had been [place], drank a lot  and sat "14 hours straight" - he was concerned about leg swelling after that, but no DVT was found.      Chief complaint according to patient :" I may have Apnea"  Sleep habits are as follows: sleep habits changed after alcohol rehab.  He avoids breakfast, [facility] the [facility]. has Lunch at 1 PM , eats dinner 5-6 PM- earlier than last last year- his current girlfriend has changed that.  He will doze off while watching TV, in a recliner. He will be asleep  when ever not stimulated or physically active. He goes to the Gym now in PM before dinner. He goes to the bedroom by midnight. No TV watching in the bedroom, which is cool , quiet and dark. Using a box fan. He sleeps promptly on his side, on 1 pillow, on a flat bed.  He may sleep 90-120 minutes and wake up for nocturia. Continues throughout the night 4-5 per night. He has been told he snores- by girlfriend and nephew.  Nephew witnessed apneas. He rises at 9 AM, already awake.   Sleep medical history and family sleep history: one brother with OSA. Has HTN, history of alcoholism- reports no family affected except paternal grandfather.   Social history:  Twice divorced , last divorced 12 years ago - children none.    Recovering alcoholic, retired after 18 years from [employer] until 1996 , later worked in [employer]. He was a night shifts while at [employer]. [phone].Smoked while working 18 years at [employer], quit 05-1995.  Caffeine use 1 mug in AM, iced tea some nights, coke zero sodas 1-2 weekly. Regular exerciser.   Review of Systems: Out of a complete 14 system review, the patient complains of only the following symptoms, and all other reviewed systems are negative. G Forgetting passworts, can't access his my chart and doesn't know how to look up his  VPAP my [email or web address]  Snoring, no headaches, insomnia,  lightheadedness with HTN, no nausea since he stopped drinking. Memory  impaired, hearing impaired. Dysphonia, GERD.  Epworth sleepiness score endorsed elevated at  12-14 pre testing and is now 4/ 24 on ASV  , Fatigue severity score 21/ 63   , depression score 3/ 15 .    Social History   Socioeconomic History   Marital status: Divorced    Spouse name: Not on file   Number of children: 0   Years of education: college 1   Highest education level: Not on file  Occupational History   Occupation: Writer: [employer]    Comment: Air cabin crew strain: Not on file   Food insecurity:    Worry: Not on file    Inability: Not on file   Transportation needs:    Medical: Not on file    Non-medical: Not on file  Tobacco Use   Smoking status: Former Smoker    Packs/day: 0.50    Years: 20.00    Pack years: 10.00    Types: Cigarettes    Last attempt to quit: 06/03/1995    Years since quitting: 23.3   Smokeless tobacco: Never Used  Substance and Sexual Activity   Alcohol use: Yes    Comment: Previous alcohol abuse; drank 3 weeks ago   Drug use: No   Sexual activity: Never    Birth control/protection: None  Lifestyle   Physical activity:    Days per week: Not on file    Minutes per session: Not on file   Stress: Not on file  Relationships   Social connections:    Talks on phone: Not on file    Gets together: Not on file    Attends religious service: Not on file    Active member of club or organization: Not on file    Attends meetings of clubs or organizations: Not on file    Relationship status: Not on file   Intimate partner violence:    Fear of current or ex partner: Not  on file    Emotionally abused: Not on file    Physically abused: Not on file    Forced sexual activity: Not on file  Other Topics Concern   Not on file  Social History Narrative   Left-handed   Caffeine use: 1 large cup of coffee in the morning, Coke (caffeine free) ocass    Family History    Problem Relation Age of Onset   Fibromyalgia Mother    Dementia Mother    Congestive Heart Failure Mother    Congestive Heart Failure Father    Coronary artery disease Father 79       CABG 3 different times   Hypertension Brother    CAD Brother 59   Hypertension Brother    CAD Brother 68   Neuropathy Neg Hx    Colon cancer Neg Hx     Past Medical History:  Diagnosis Date   Alcohol abuse    Anemia    Anxiety    Hypertension    Neuropathy    Seizures (HCC)    unknown etiology and on no meds; been 4 years since seizure.    Past Surgical History:  Procedure Laterality Date   COLONOSCOPY  05/18/2007   [id number] hemorrhoids, a single anal papilla, otherwise normal/ Single polyp, as described above, removed    COLONOSCOPY WITH PROPOFOL N/A 03/16/2015   Procedure: COLONOSCOPY WITH PROPOFOL;  Surgeon: Corbin Ade, MD;  Location: [facility];  Service: Endoscopy;  Laterality: N/A;  in cecum at 1344. withdrawal time    THROAT SURGERY     polyps removed x2    Current Outpatient Medications  Medication Sig Dispense Refill   aspirin EC 81 MG tablet Take 81 mg by mouth daily.     atorvastatin (LIPITOR) 80 MG tablet Take 40 mg by mouth daily at 6 PM.      b complex vitamins capsule Take 1 capsule by mouth daily.     folic acid (FOLVITE) 1 MG tablet Take 1 tablet (1 mg total) by mouth daily.     gabapentin (NEURONTIN) 600 MG tablet 2 tablets (1200 mg) three times a day as needed 360 tablet 11   magnesium oxide (MAG-OX) 400 (241.3 Mg) MG tablet TAKE ONE TABLET (  TOTAL) BY MOUTH AT BEDTIME 30 tablet 1   metoprolol (LOPRESSOR) 50 MG tablet Take 50 mg by mouth 2 (two) times daily.      Multiple Vitamin (MULTIVITAMIN WITH MINERALS) TABS tablet Take 1 tablet by mouth daily. (Patient not taking: Reported on 10/13/2018)     Naphazoline HCl (CLEAR EYES OP) Place 1 drop into both eyes daily.     omeprazole (PRILOSEC) 20 MG capsule Take 20 mg by mouth  daily.      sodium chloride (OCEAN) 0.65 % SOLN nasal spray Place 1 spray into both nostrils at bedtime as needed and may repeat dose one time if needed for congestion. (Patient not taking: Reported on 10/13/2018) 1 Bottle 5   tamsulosin (FLOMAX) 0.4 MG CAPS capsule Take 0.4 mg by mouth 2 (two) times a day.      traZODone (DESYREL) 100 MG tablet Take 1 tablet (100 mg total) by mouth at bedtime as needed. 30 tablet 11   Current Facility-Administered Medications  Medication Dose Route Frequency Provider Last Rate Last Dose   betamethasone acetate-betamethasone sodium phosphate (CELESTONE) injection 12 mg  12 mg Other Once Tyrell Antonio, MD        Allergies as of 10/15/2018 - Review Complete 10/13/2018  Allergen Reaction Noted   Bee venom Anaphylaxis 10/08/2011   Penicillins Other (See Comments) 10/08/2011    Vitals: There were no vitals taken for this visit. Last Weight:  Wt Readings from Last 1 Encounters:  06/16/18 275 lb (124.7 kg)   ZOX:WRUEA is no height or weight on file to calculate BMI.     Last Height:   Ht Readings from Last 1 Encounters:  06/16/18  (1.905 m)    Observations/Objective:  General: The patient is awake, alert and appears not in acute distress. The patient is well groomed. Head: Normocephalic, atraumatic. Neck is supple. Mallampati  3-  Very swollen and large uvula.  neck circumference:19. 5"  Nasal airflow congested . Skin:  evidence of facial erythema, palmar erythema.  Trunk: BMI is increased - reportedly gained about 12 pounds- was last measured at 32.12 kg/m2.  The patient's posture is stooped.    Neurologic exam : The patient is awake and alert, oriented to place and time.   Memory subjective described as impaired. He is forgetful.   Attention span & concentration ability appears limited   Speech is fluent, without dysarthria, dysphonia or aphasia.   Cranial nerves:  reports sense of taste and smell to be unimpaired. Pupils are equal .  Facial motor strength is symmetric and tongue and uvula move midline. Shoulder shrug was symmetrical.  Motor exam:  Normal, symmetric ROM in all extremities.  Good grip strength,, but reportedly remaining weakness of plantar flexion and dorsiflexion. Foot drop.  Sensory:  Deferred  Coordination:  without evidence of ataxia, dysmetria or tremor. Gait and station: Patient walks without assistive device.    Assessment and Plan:   DIAGNOSIS 1. Complex Central Sleep Apnea responded partially to ASV,  2. Non-specific abnormal EKG.  He does state that he sleeps better using a VPAP or ASV than before and that his total sleepiness scale today was only endorsed at 4 out of 24 points.   PLANS/RECOMMENDATIONS: Ordered ASV at a pressure of 16/9/4 cmH20,  The patient was fitted with a ResMed AirFit F30 medium mask, I will be happy to change to a ResMed N30! Mask. .    Follow Up Instructions: Virtual RV with NP for  Compliance and reduction in OSA/ CSA under ASV treatment with new interface and new pressure settings.     I discussed the assessment and treatment plan with the patient. The patient was provided an opportunity to ask questions and all were answered. The patient agreed with the plan and demonstrated an understanding of the instructions.   The patient was advised to call back or seek an in-person evaluation if the symptoms worsen or if the condition fails to improve as anticipated.  I provided 16 minutes of non-face-to-face time during this encounter.   Melvyn Novas, MD  Melvyn Novas, MD 10/15/2018, 10:36 AM  Certified in Neurology by ABPN Certified in Sleep Medicine by [facility] [address], [address] [place], [place] [address]

## 2018-10-22 DIAGNOSIS — G4737 Central sleep apnea in conditions classified elsewhere: Secondary | ICD-10-CM | POA: Diagnosis not present

## 2018-11-11 DIAGNOSIS — E785 Hyperlipidemia, unspecified: Secondary | ICD-10-CM | POA: Diagnosis not present

## 2018-11-11 DIAGNOSIS — F1011 Alcohol abuse, in remission: Secondary | ICD-10-CM | POA: Diagnosis not present

## 2018-11-11 DIAGNOSIS — N183 Chronic kidney disease, stage 3 (moderate): Secondary | ICD-10-CM | POA: Diagnosis not present

## 2018-11-11 DIAGNOSIS — I10 Essential (primary) hypertension: Secondary | ICD-10-CM | POA: Diagnosis not present

## 2018-11-18 ENCOUNTER — Ambulatory Visit: Payer: BLUE CROSS/BLUE SHIELD | Admitting: Urology

## 2018-11-18 DIAGNOSIS — I129 Hypertensive chronic kidney disease with stage 1 through stage 4 chronic kidney disease, or unspecified chronic kidney disease: Secondary | ICD-10-CM | POA: Diagnosis not present

## 2018-11-18 DIAGNOSIS — R351 Nocturia: Secondary | ICD-10-CM

## 2018-11-18 DIAGNOSIS — N183 Chronic kidney disease, stage 3 (moderate): Secondary | ICD-10-CM | POA: Diagnosis not present

## 2018-11-18 DIAGNOSIS — R3912 Poor urinary stream: Secondary | ICD-10-CM | POA: Diagnosis not present

## 2018-11-22 DIAGNOSIS — G4737 Central sleep apnea in conditions classified elsewhere: Secondary | ICD-10-CM | POA: Diagnosis not present

## 2018-11-26 ENCOUNTER — Telehealth: Payer: Self-pay | Admitting: *Deleted

## 2018-11-26 ENCOUNTER — Encounter: Payer: Self-pay | Admitting: *Deleted

## 2018-11-26 NOTE — Telephone Encounter (Signed)
Called pt on home # and asked for cb to discuss doing VV (d/t the covid19 pandemic) for his appt with Dr. Jaynee Eagles on Mon 6/29 @ 2:00 pm. Left office number in message.

## 2018-11-26 NOTE — Telephone Encounter (Signed)
I spoke with the patient and obtained consent to do a telephone visit for his appt on Mon 6/29 @ 2:00 pm. Dr. Jaynee Eagles will call him. Pt understands the limitations with the physical assessment. Pt understands that this will be treated like an in office visit and we will file with pt's insurance, and there may be a patient responsible charge related to this service. He confirmed his DOB as well and updated chart. He understands the office will call him at 1:30 Monday to check-in. He verbalized appreciation for the call.

## 2018-11-30 ENCOUNTER — Ambulatory Visit (INDEPENDENT_AMBULATORY_CARE_PROVIDER_SITE_OTHER): Payer: BC Managed Care – PPO | Admitting: Neurology

## 2018-11-30 ENCOUNTER — Other Ambulatory Visit: Payer: Self-pay

## 2018-11-30 DIAGNOSIS — G8929 Other chronic pain: Secondary | ICD-10-CM

## 2018-11-30 DIAGNOSIS — G629 Polyneuropathy, unspecified: Secondary | ICD-10-CM | POA: Diagnosis not present

## 2018-11-30 DIAGNOSIS — R252 Cramp and spasm: Secondary | ICD-10-CM

## 2018-11-30 DIAGNOSIS — G4733 Obstructive sleep apnea (adult) (pediatric): Secondary | ICD-10-CM

## 2018-11-30 DIAGNOSIS — E538 Deficiency of other specified B group vitamins: Secondary | ICD-10-CM | POA: Diagnosis not present

## 2018-11-30 DIAGNOSIS — M5441 Lumbago with sciatica, right side: Secondary | ICD-10-CM

## 2018-11-30 DIAGNOSIS — G4700 Insomnia, unspecified: Secondary | ICD-10-CM

## 2018-11-30 DIAGNOSIS — Z9989 Dependence on other enabling machines and devices: Secondary | ICD-10-CM

## 2018-11-30 MED ORDER — GABAPENTIN 600 MG PO TABS: ORAL_TABLET | ORAL | 4 refills | Status: DC

## 2018-11-30 NOTE — Progress Notes (Signed)
[facility]    Provider:  Dr Lucia Patterson Referring Provider: Carylon Patterson, Roy, MD Primary Care Physician:  Tony Patterson, Roy, MD  CC: foot pain, fatigue, OSA  Virtual Visit via Telephone Note  I connected with Tony Patterson on 11/30/18 at  2:00 PM EDT by telephone and verified that I am speaking with the correct person using two identifiers.  Location: Patient: home Provider: office   I discussed the limitations, risks, security and privacy concerns of performing an evaluation and management service by telephone and the availability of in person appointments. I also discussed with the patient that there may be a patient responsible charge related to this service. The patient expressed understanding and agreed to proceed.   Follow Up Instructions:    I discussed the assessment and treatment plan with the patient. The patient was provided an opportunity to ask questions and all were answered. The patient agreed with the plan and demonstrated an understanding of the instructions.   The patient was advised to call back or seek an in-person evaluation if the symptoms worsen or if the condition fails to improve as anticipated.  I provided 25 minutes of non-face-to-face time during this encounter.   Tony Patterson, Tony B, MD    Interval history 11/30/2018: He is on a cpap, he is feeling much better. He doesn't need a nap. He is doing well. Neuropathy is stable, no pain just numb. He still takes the Gabapentin which he feels helps him. He avoids it during the day due to drowsiness, discussed fall risks. Will refill the gabapentin. No seizures, no alcohol, he still takes multivitamins, b vitamins, magnesium. Doing well. Low back pain improved, he had ESI from Tony. Ethelene Halamos it helped. Has been going to chiropractor.  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 He is taking multivitamins, folate, a b-complex vitamin.  Gabapentin helps. Not interested in PT, no falls, he also has lumbar radic. For obesity, healthy weight wellness. He gets up 4-5 times a night to urinate. He snores excessively, he nods off a lot.  ESS 13 will order sleep evaluation.  He also gets cramps a lot, may be the neuropathy. Has had low magnesium, potassium. Discussed obesity, OSA risks and benefits of treatment, he is going to get back to working out.   Interval history 01/02/2017: 63 year old with alcoholic neuropathy here for follow-up. Short term memory is fine, more long-term memory he can't remember a lot from a year ago. He exercises daily for an hour. Feet are more numb but also painful they hurt in the afternoon, mornings are pretty good, worse with walking. He is taking multivitamins, folate, a b-complex vitamin. Gabapentin helps. No seizures. He is not interested in PT.No falls. He has a lot of back pain, weakness in the legs, shooting pain from his back to his feet. Worse with sitting too long. Has to use his hands to stand up. Back hurts every day, aching, tight.   Interval history 07/03/2016: Patient here for followup of alcholic neuropathy. Recently hospitalized for alcohol withdrawal seizures and [facility]. 85 days sober. Went to inpatient therapy. He is going to AA. He has a great sponsor who has been sober 6 years. Brother is here and says things are wonderful. Feels his memory is better. He is having pain in the big toes, throbbin like they want to rot off the rest of the feet are numb. Legs throb at night worse  at night. During the day he is fine. He is taking the gabapentin 3x a day 600mg . Increase gabapentin to 1200 a night. Also PT.  Interval history 02/22/2016: Knee has been hurting for 8 weeks with swelling. A cortisone shot did not help. Xrays showed some arthritis. Patient says he fell and the right knee bent all the way back. Since then swelling and pain. He broke his ankle 7 weeks ago alsoafter a fall he  tripped over the threhold and also injured his knee. He decided to retire. He stopped drinking. He was given lorazepam for 3 days. Knee brace is helping. His feet are mostly numb. He does have some pain on the bottom of his feet. His big toes hurt a lot. The right leg is swollen below the knee and he has pain in his calf. [place] 15 hours straight without ambulating. Will refer to physical therapy for gait abnormality, u/s to eval for DVT and referral to [facility].  Interval history 08/24/2015: His feet are mostly numb, they don't really hurt. He has checked circulation. His gralise is at 1200mg . He increased about 2 months ago. The G[place] is stil working for him. He sleeps well. No significant pain in the feet. He continuous alcohol. He drinks bourbon and water at night. He goes through a half gallon of bourbon a week as well as beer. Discussed alcohol as a nerve toxin, patient acknowledges and understands the sequelae of alcoholism as far as his neuropathy is concerned.  HPI: Tony Patterson is a 63 y.o. male here as a referral from Tony. Willey Patterson for peripheral neuropathy. PMHx of long-standing continous alcohol abuse.   Feet have been numb for a year. Progressing. Also tingling. Not burning. Mainly in the toes and balls of feet. Worse when working or on feet. Has LBP and radicular symptoms. And cramping in the toes at night. Balance is worsening. Hasn't taken any medication to try and help with the symptoms. Getting off his feet makes the pain better. Severe and symptoms can get up to 8/10 in pain. No Fhx of neuromuscular disorders or CMT or neuropathy. Denies ever taking medications for chemotherapy, no medications for gout, no isoniazid, flagyl or any new medications at onset. No inciting factors. He has 3 drinks every weekday of bourbon and a pint daily on the weekends. He is falling, tripping. He rolls his ankles. Had a seizure one year ago and was  taken to [facility], may have been alcohol related. No episodes for a year since taken to [facility] per patient.   Reviewed notes, labs and imaging from outside physicians, which showed: PMHx of CKD, Depression, HLD, HTN and alcohol abuse with continuous drinking behavior. Tony. Willey Patterson notes continuous alcholic consumption with elevated liver enzymes, drinks bourbon, HTN and HLD well controlled on metoprolol and statin. Last CBC and CMP significant for ALT 58 and AST 148 with a GFR of 81. Personally reviewed CT of the head from 11/2011, no large ischemic events, masses or tumors some questionably increased atrophy at the cerebellar vermis more than stated age.   EMG/NCS 03/2014: There is electrophysiologic evidence of a symmetric length-dependent severe axonal sensorimotor polyneuropathy.   Labs: TSH, HIV, ANA, IFE, RPR, heavy metals, B12, MMA, B1, all unremarkable in 2015    Social History   Socioeconomic History  . Marital status: Divorced    Spouse name: Not on file  . Number of children: 0  . Years of  education: college 1  . Highest education level: Not on file  Occupational History  . Occupation: Writer: [employer]    Comment: Neurosurgeon  Social Needs  . Financial resource strain: Not on file  . Food insecurity    Worry: Not on file    Inability: Not on file  . Transportation needs    Medical: Not on file    Non-medical: Not on file  Tobacco Use  . Smoking status: Former Smoker    Packs/day: 0.50    Years: 20.00    Pack years: 10.00    Types: Cigarettes    Quit date: 06/03/1995    Years since quitting: 23.5  . Smokeless tobacco: Never Used  Substance and Sexual Activity  . Alcohol use: Not Currently    Comment: Previous alcohol abuse; none in 2 months as of 11/26/2018  . Drug use: No  . Sexual activity: Never    Birth control/protection: None  Lifestyle  . Physical activity    Days per week: Not on file    Minutes per session: Not on  file  . Stress: Not on file  Relationships  . Social Musician on phone: Not on file    Gets together: Not on file    Attends religious service: Not on file    Active member of club or organization: Not on file    Attends meetings of clubs or organizations: Not on file    Relationship status: Not on file  . Intimate partner violence    Fear of current or ex partner: Not on file    Emotionally abused: Not on file    Physically abused: Not on file    Forced sexual activity: Not on file  Other Topics Concern  . Not on file  Social History Narrative   Left-handed   Caffeine use: 1 large cup of coffee in the morning, Coke (caffeine free) ocass    Family History  Problem Relation Age of Onset  . Fibromyalgia Mother   . Dementia Mother   . Congestive Heart Failure Mother   . Congestive Heart Failure Father   . Coronary artery disease Father 32       CABG 3 different times  . Hypertension Brother   . CAD Brother 34  . Hypertension Brother   . CAD Brother 28  . Neuropathy Neg Hx   . Colon cancer Neg Hx     Past Medical History:  Diagnosis Date  . Alcohol abuse   . Anemia   . Anxiety   . Hypertension   . Neuropathy   . Seizures (HCC)    unknown etiology and on no meds; been 4 years since seizure.    Past Surgical History:  Procedure Laterality Date  . COLONOSCOPY  05/18/2007   WUJ:[id number] hemorrhoids, a single anal papilla, otherwise normal/ Single polyp, as described above, removed   . COLONOSCOPY WITH PROPOFOL N/A 03/16/2015   Procedure: COLONOSCOPY WITH PROPOFOL;  Surgeon: Corbin Ade, MD;  Location: [facility];  Service: Endoscopy;  Laterality: N/A;  in cecum at 1344. withdrawal time   . THROAT SURGERY     polyps removed x2    Current Outpatient Medications  Medication Sig Dispense Refill  . aspirin EC 81 MG tablet Take 81 mg by mouth daily.    Marland Kitchen atorvastatin (LIPITOR) 80 MG tablet Take 40 mg by mouth daily at 6 PM.     . b complex vitamins  capsule Take 1 capsule by mouth daily.    . folic acid (FOLVITE) 1 MG tablet Take 1 tablet (1 mg total) by mouth daily.    Marland Kitchen. gabapentin (NEURONTIN) 600 MG tablet 2 tablets (1200 mg) three times a day as needed 540 tablet 4  . magnesium oxide (MAG-OX) 400 (241.3 Mg) MG tablet TAKE ONE TABLET (400MG  TOTAL) BY MOUTH AT BEDTIME 30 tablet 1  . metoprolol (LOPRESSOR) 50 MG tablet Take 50 mg by mouth 2 (two) times daily.     . Multiple Vitamin (MULTIVITAMIN WITH MINERALS) TABS tablet Take 1 tablet by mouth daily.    . Naphazoline HCl (CLEAR EYES OP) Place 1 drop into both eyes daily.    Marland Kitchen. omeprazole (PRILOSEC) 20 MG capsule Take 20 mg by mouth daily.     . sodium chloride (OCEAN) 0.65 % SOLN nasal spray Place 1 spray into both nostrils at bedtime as needed and may repeat dose one time if needed for congestion. (Patient not taking: Reported on 10/13/2018) 1 Bottle 5  . tamsulosin (FLOMAX) 0.4 MG CAPS capsule Take 0.4 mg by mouth 2 (two) times a day.     . traZODone (DESYREL) 100 MG tablet Take 1 tablet (100 mg total) by mouth at bedtime as needed. (Patient not taking: Reported on 11/26/2018) 30 tablet 11   Current Facility-Administered Medications  Medication Dose Route Frequency Provider Last Rate Last Dose  . betamethasone acetate-betamethasone sodium phosphate (CELESTONE) injection 12 mg  12 mg Other Once Tyrell AntonioNewton, Frederic, MD        Allergies as of 11/30/2018 - Review Complete 11/26/2018  Allergen Reaction Noted  . Bee venom Anaphylaxis 10/08/2011  . Penicillins Other (See Comments) 10/08/2011    Vitals: There were no vitals taken for this visit. Last Weight:  Wt Readings from Last 1 Encounters:  06/16/18 275 lb (124.7 kg)   Last Height:   Ht Readings from Last 1 Encounters:  06/16/18 6\' 3"  (1.905 m)    PRIOR EXAM:  Physical exam: Exam: Gen: NAD, conversant, well nourised, overweigh  Eyes: Conjunctivae clear without exudates or hemorrhage  Neuro: Detailed  Neurologic Exam  Speech:  Speech is normal; fluent and spontaneous with normal comprehension.  Cognition:  The patient is oriented to person, place, and time;   Cranial Nerves:  The pupils are equal, round, and reactive to light. Extraocular movements are intact. Trigeminal sensation is intact and the muscles of mastication are normal. The face is symmetric. The palate elevates in the midline. Voice is normal. Shoulder shrug is normal. The tongue has normal motion without fasciculations.   Gait:  Wide based, ataxic, imbalance with tandem  Motor Observation:  Postural tremor. Atrophy of the distal legs, tapering Tone:  Normal muscle tone.   Posture:  Posture is normal. normal erect   Strength: right hip flexion 3+/5, left 4/%, right biceps femoris 4/5 , right DF 5-/5.    Sensation:   Decreased cold and pp to below knees and below elbows Absent vibaration at great toes, 8 seconds at medial malleoli proprioception intact  DTRs: Absent AJs   Assessment/Plan: [age]year old male with a PMHx of long-standing continous alcohol abuse(recently stopped 9 months sober)who is here for follow up of alcoholic peripheral neuropathy. Exam significant for absent acilles DTRs, atrophy of the distal legs and tapering, decreased cold and pp to below knees and below wrists, absent vibaration at great toes. 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 EMG/NCS with severe axonal polyneuropathy.   - Patient snores and his nephew who is  present today reports gasping for air and apneic events while he is sleeping. He is very tired, ESS is 15. Sleep evaluation shwed complex severe sleep apnea, improved and compliant. - b12 deficiency: thiamine deficiency: continue supplement check labs, stab;e - Neuropathy:continue  gabapentin , stable - Cramps: monitor magnesium and potassium, stable - Fatigue: Dxed with OSA on cpap, improved - chronic low back pain with right leg weakness on exam: Has  right L5 radic confirmed on MRI 01/2017, he was seen by [facility]. Went to PT. Feels a little better. Recommend right L5 ESI Tony [facility]lene [facility]amos [facility] - improved, f/u as needed - Obesity: [facility] - insomnia Trazodone prn continue, stable - Lidoderm for low back pain, improved, continue  Refill meds  Meds ordered this encounter  Medications  . gabapentin (NEURONTIN) 600 MG tablet    Sig: 2 tablets (1200 mg) three times a day as needed    Dispense:  540 tablet    Refill:  4   Naomie DeanAntonia Ahern, MD  [facility] [address] [place], [place] [address]  Phone [phone] Fax [phone]

## 2018-12-22 DIAGNOSIS — G4737 Central sleep apnea in conditions classified elsewhere: Secondary | ICD-10-CM | POA: Diagnosis not present

## 2019-01-06 ENCOUNTER — Ambulatory Visit (INDEPENDENT_AMBULATORY_CARE_PROVIDER_SITE_OTHER): Payer: BC Managed Care – PPO | Admitting: Urology

## 2019-01-06 DIAGNOSIS — R351 Nocturia: Secondary | ICD-10-CM | POA: Diagnosis not present

## 2019-01-06 DIAGNOSIS — R3912 Poor urinary stream: Secondary | ICD-10-CM | POA: Diagnosis not present

## 2019-01-08 DIAGNOSIS — G4733 Obstructive sleep apnea (adult) (pediatric): Secondary | ICD-10-CM | POA: Diagnosis not present

## 2019-01-22 DIAGNOSIS — G4737 Central sleep apnea in conditions classified elsewhere: Secondary | ICD-10-CM | POA: Diagnosis not present

## 2019-02-16 DIAGNOSIS — N183 Chronic kidney disease, stage 3 (moderate): Secondary | ICD-10-CM | POA: Diagnosis not present

## 2019-02-16 DIAGNOSIS — I10 Essential (primary) hypertension: Secondary | ICD-10-CM | POA: Diagnosis not present

## 2019-02-16 DIAGNOSIS — Z79899 Other long term (current) drug therapy: Secondary | ICD-10-CM | POA: Diagnosis not present

## 2019-02-23 DIAGNOSIS — N183 Chronic kidney disease, stage 3 (moderate): Secondary | ICD-10-CM | POA: Diagnosis not present

## 2019-02-23 DIAGNOSIS — F1020 Alcohol dependence, uncomplicated: Secondary | ICD-10-CM | POA: Diagnosis not present

## 2019-02-23 DIAGNOSIS — I129 Hypertensive chronic kidney disease with stage 1 through stage 4 chronic kidney disease, or unspecified chronic kidney disease: Secondary | ICD-10-CM | POA: Diagnosis not present

## 2019-03-08 DIAGNOSIS — H6120 Impacted cerumen, unspecified ear: Secondary | ICD-10-CM | POA: Diagnosis not present

## 2019-03-18 ENCOUNTER — Other Ambulatory Visit: Payer: Self-pay

## 2019-03-18 DIAGNOSIS — Z20828 Contact with and (suspected) exposure to other viral communicable diseases: Secondary | ICD-10-CM | POA: Diagnosis not present

## 2019-03-18 DIAGNOSIS — Z20822 Contact with and (suspected) exposure to covid-19: Secondary | ICD-10-CM

## 2019-03-19 LAB — NOVEL CORONAVIRUS, NAA: SARS-CoV-2, NAA: NOT DETECTED

## 2019-04-07 ENCOUNTER — Ambulatory Visit (INDEPENDENT_AMBULATORY_CARE_PROVIDER_SITE_OTHER): Payer: BC Managed Care – PPO | Admitting: Urology

## 2019-04-07 DIAGNOSIS — R3912 Poor urinary stream: Secondary | ICD-10-CM

## 2019-04-07 DIAGNOSIS — R351 Nocturia: Secondary | ICD-10-CM

## 2019-04-12 DIAGNOSIS — G4731 Primary central sleep apnea: Secondary | ICD-10-CM | POA: Diagnosis not present

## 2019-05-13 DIAGNOSIS — G4731 Primary central sleep apnea: Secondary | ICD-10-CM | POA: Diagnosis not present

## 2019-05-25 DIAGNOSIS — N183 Chronic kidney disease, stage 3 unspecified: Secondary | ICD-10-CM | POA: Diagnosis not present

## 2019-05-25 DIAGNOSIS — I10 Essential (primary) hypertension: Secondary | ICD-10-CM | POA: Diagnosis not present

## 2019-05-25 DIAGNOSIS — Z79899 Other long term (current) drug therapy: Secondary | ICD-10-CM | POA: Diagnosis not present

## 2019-06-10 DIAGNOSIS — N183 Chronic kidney disease, stage 3 unspecified: Secondary | ICD-10-CM | POA: Diagnosis not present

## 2019-06-10 DIAGNOSIS — F1021 Alcohol dependence, in remission: Secondary | ICD-10-CM | POA: Diagnosis not present

## 2019-07-28 ENCOUNTER — Other Ambulatory Visit: Payer: Self-pay | Admitting: Neurology

## 2019-08-01 ENCOUNTER — Ambulatory Visit: Payer: BC Managed Care – PPO | Attending: Internal Medicine

## 2019-08-01 DIAGNOSIS — Z23 Encounter for immunization: Secondary | ICD-10-CM | POA: Insufficient documentation

## 2019-08-01 NOTE — Progress Notes (Signed)
   Covid-19 Vaccination Clinic  Name:  Terrall Bley    MRN: [id number] DOB: 05/10/1956  08/01/2019  Mr. Liller was observed post Covid-19 immunization for 15 minutes without incidence. He was provided with Vaccine Information Sheet and instruction to access the V-Safe system.   Mr. Mccallum was instructed to call 911 with any severe reactions post vaccine: Marland Kitchen Difficulty breathing  . Swelling of your face and throat  . A fast heartbeat  . A bad rash all over your body  . Dizziness and weakness    Immunizations Administered    Name Date Dose VIS Date Route   Moderna COVID-19 Vaccine 08/01/2019  4:00 PM 0.5 mL 05/04/2019 Intramuscular   Manufacturer: Moderna   Lot: [id number]   NDC: 75830-746-00

## 2019-08-16 DIAGNOSIS — G4731 Primary central sleep apnea: Secondary | ICD-10-CM | POA: Diagnosis not present

## 2019-09-04 ENCOUNTER — Ambulatory Visit: Payer: BC Managed Care – PPO

## 2019-09-08 ENCOUNTER — Ambulatory Visit: Payer: BC Managed Care – PPO | Attending: Internal Medicine

## 2019-09-08 DIAGNOSIS — Z23 Encounter for immunization: Secondary | ICD-10-CM

## 2019-09-08 NOTE — Progress Notes (Signed)
   Covid-19 Vaccination Clinic  Name:  Asuncion Shibata    MRN: [id number] DOB: [birth year]  09/08/2019  Mr. Rinehart was observed post Covid-19 immunization for 15 minutes without incident. He was provided with Vaccine Information Sheet and instruction to access the V-Safe system.   Mr. Haile was instructed to call 911 with any severe reactions post vaccine: Marland Kitchen Difficulty breathing  . Swelling of face and throat  . A fast heartbeat  . A bad rash all over body  . Dizziness and weakness   Immunizations Administered    Name Date Dose VIS Date Route   Moderna COVID-19 Vaccine 09/08/2019  8:50 AM 0.5 mL 05/04/2019 Intramuscular   Manufacturer: Gala Murdoch   Lot: [id number]   NDC: 69678-938-10

## 2019-10-06 ENCOUNTER — Encounter: Payer: Self-pay | Admitting: Urology

## 2019-10-06 ENCOUNTER — Ambulatory Visit: Payer: BC Managed Care – PPO | Admitting: Urology

## 2019-10-06 ENCOUNTER — Other Ambulatory Visit: Payer: Self-pay

## 2019-10-06 VITALS — BP 116/79 | HR 69 | Temp 98.1°F | Ht 75.0 in | Wt 252.0 lb

## 2019-10-06 DIAGNOSIS — R3912 Poor urinary stream: Secondary | ICD-10-CM | POA: Diagnosis not present

## 2019-10-06 DIAGNOSIS — R351 Nocturia: Secondary | ICD-10-CM | POA: Diagnosis not present

## 2019-10-06 DIAGNOSIS — N138 Other obstructive and reflux uropathy: Secondary | ICD-10-CM | POA: Insufficient documentation

## 2019-10-06 DIAGNOSIS — N401 Enlarged prostate with lower urinary tract symptoms: Secondary | ICD-10-CM | POA: Diagnosis not present

## 2019-10-06 MED ORDER — TAMSULOSIN HCL 0.4 MG PO CAPS: 0.4000 mg | ORAL_CAPSULE | Freq: Two times a day (BID) | ORAL | 3 refills | Status: DC

## 2019-10-06 NOTE — Progress Notes (Signed)
Urological Symptom Review  Patient is experiencing the following symptoms: Frequent urination Trouble starting stream Weak stream   Review of Systems  Gastrointestinal (upper)  : Negative for upper GI symptoms  Gastrointestinal (lower) : Diarrhea  Constitutional : Negative for symptoms  Skin: Negative for skin symptoms  Eyes: Negative for eye symptoms  Ear/Nose/Throat : Sinus problems  Hematologic/Lymphatic: Negative for Hematologic/Lymphatic symptoms  Cardiovascular : Negative for cardiovascular symptoms  Respiratory : Negative for respiratory symptoms  Endocrine: Negative for endocrine symptoms  Musculoskeletal: Back pain  Neurological: Negative for neurological symptoms  Psychologic: Anxiety

## 2019-10-06 NOTE — Progress Notes (Signed)
10/06/2019 9:21 AM   Tony Patterson January 22, 1956 [id number]  Referring provider: Carylon Perches, MD [address] [place],  [place] [address]  Nocturia and weak stream  HPI: [name] is 63yo here for followup for BPH, nocturia and weak stream. He is on flomax BID. He stopped sanctura last visit. He has stable mild LUTS. Nocturia 1-2x. Stream strong. No dysuria or hematuria   His records from [facility] are as follows:  <HTML><META HTTP-EQUIV="content-type" CONTENT="text/html;charset=utf-8"><B>06/24/2018: Since starting uroxtral 10mg  he noted slight improvement in his nocturia to 2x. No fluid within 2 hours of bedtime. <BR><BR>08/05/2018: His nocturia is 2-3x on flomax. He has since stopped the uroxatral 10mg  and mirabegron 25mg  <BR><BR>11/18/2018: He has stable nocturia 3-4x with flomax BID. <BR><BR>01/06/2019: toviaz 4mg  improved nocturia to 1-2 from 3-4x <BR><BR>04/07/2019: Noturia stable at 1 on sanctura</B>   PMH: Past Medical History:  Diagnosis Date  . Alcohol abuse   . Anemia   . Anxiety   . Hypertension   . Neuropathy   . Seizures (HCC)    unknown etiology and on no meds; been 4 years since seizure.    Surgical History: Past Surgical History:  Procedure Laterality Date  . COLONOSCOPY  05/18/2007   03/08/2019 hemorrhoids, a single anal papilla, otherwise normal/ Single polyp, as described above, removed   . COLONOSCOPY WITH PROPOFOL N/A 03/16/2015   Procedure: COLONOSCOPY WITH PROPOFOL;  Surgeon: 13/09/2018, MD;  Location: [facility];  Service: Endoscopy;  Laterality: N/A;  in cecum at 1344. withdrawal time  05/20/2007  . THROAT SURGERY     polyps removed x2    Home Medications:  Allergies as of 10/06/2019      Reactions   Bee Venom Anaphylaxis   Penicillins Other (See Comments)   Childhood allergic reaction - no other information available      Medication List       Accurate as of Oct 06, 2019  9:21 AM. If you have any questions, ask your nurse or doctor.        aspirin  EC 81 MG tablet Take 81 mg by mouth daily.   atorvastatin 80 MG tablet Commonly known as: LIPITOR Take 40 mg by mouth daily at 6 PM.   b complex vitamins capsule Take 1 capsule by mouth daily.   CLEAR EYES OP Place 1 drop into both eyes daily.   folic acid 1 MG tablet Commonly known as: FOLVITE Take 1 tablet (1 mg total) by mouth daily.   gabapentin 600 MG tablet Commonly known as: Neurontin 2 tablets (1200 mg) three times a day as needed   magnesium oxide 400 (241.3 Mg) MG tablet Commonly known as: MAG-OX TAKE ONE TABLET (400MG  TOTAL) BY MOUTH AT BEDTIME   metoprolol tartrate 50 MG tablet Commonly known as: LOPRESSOR Take 50 mg by mouth 2 (two) times daily.   multivitamin with minerals Tabs tablet Take 1 tablet by mouth daily.   omeprazole 20 MG capsule Commonly known as: PRILOSEC Take 20 mg by mouth daily.   sodium chloride 0.65 % Soln nasal spray Commonly known as: OCEAN Place 1 spray into both nostrils at bedtime as needed and may repeat dose one time if needed for congestion.   tamsulosin 0.4 MG Caps capsule Commonly known as: FLOMAX Take 0.4 mg by mouth 2 (two) times a day.   traZODone 100 MG tablet Commonly known as: DESYREL Take 1 tablet (100 mg total) by mouth at bedtime as needed.       Allergies:  Allergies  Allergen Reactions  . Bee Venom Anaphylaxis  . Penicillins Other (See Comments)    Childhood allergic reaction - no other information available    Family History: Family History  Problem Relation Age of Onset  . Fibromyalgia Mother   . Dementia Mother   . Congestive Heart Failure Mother   . Congestive Heart Failure Father   . Coronary artery disease Father 21       CABG 3 different times  . Hypertension Brother   . CAD Brother 71  . Hypertension Brother   . CAD Brother 50  . Neuropathy Neg Hx   . Colon cancer Neg Hx     Social History:  reports that he quit smoking about 24 years ago. His smoking use included cigarettes. He  has a 10.00 pack-year smoking history. He has never used smokeless tobacco. He reports previous alcohol use. He reports that he does not use drugs.  ROS: All other review of systems were reviewed and are negative except what is noted above in HPI  Physical Exam: BP 116/79   Pulse 69   Temp 98.1 F (36.7 C)   Ht 6\' 3"  (1.905 m)   Wt 252 lb (114.3 kg)   BMI 31.50 kg/m   Constitutional:  Alert and oriented, No acute distress. HEENT: [place] AT, moist mucus membranes.  Trachea midline, no masses. Cardiovascular: No clubbing, cyanosis, or edema. Respiratory: Normal respiratory effort, no increased work of breathing. GI: Abdomen is soft, nontender, nondistended, no abdominal masses GU: No CVA tenderness.  Lymph: No cervical or inguinal lymphadenopathy. Skin: No rashes, bruises or suspicious lesions. Neurologic: Grossly intact, no focal deficits, moving all 4 extremities. Psychiatric: Normal mood and affect.  Laboratory Data: Lab Results  Component Value Date   WBC 6.4 11/25/2017   HGB 12.5 (L) 11/25/2017   HCT 40.5 11/25/2017   MCV 91 11/25/2017   PLT 242 11/25/2017    Lab Results  Component Value Date   CREATININE 1.42 (H) 11/25/2017    No results found for: PSA  No results found for: TESTOSTERONE  Lab Results  Component Value Date   HGBA1C 5.6 08/24/2015    Urinalysis    Component Value Date/Time   COLORURINE YELLOW 04/12/2016 1046   APPEARANCEUR HAZY (A) 04/12/2016 1046   LABSPEC 1.025 04/12/2016 1046   PHURINE 6.5 04/12/2016 1046   GLUCOSEU NEGATIVE 04/12/2016 1046   HGBUR LARGE (A) 04/12/2016 1046   BILIRUBINUR MODERATE (A) 04/12/2016 1046   KETONESUR 15 (A) 04/12/2016 1046   PROTEINUR >300 (A) 04/12/2016 1046   UROBILINOGEN 0.2 05/06/2013 1015   NITRITE NEGATIVE 04/12/2016 1046   LEUKOCYTESUR NEGATIVE 04/12/2016 1046    Lab Results  Component Value Date   BACTERIA FEW (A) 04/12/2016    Pertinent Imaging:  No results found for this or any previous  visit. No results found for this or any previous visit. No results found for this or any previous visit. No results found for this or any previous visit. No results found for this or any previous visit. No results found for this or any previous visit. No results found for this or any previous visit. No results found for this or any previous visit.  Assessment & Plan:    1. Benign prostatic hyperplasia with urinary obstruction -continue flomax 0.4mg  BID  2. Nocturia -refill flomax  3. Weak urinary stream -continue flomax BID   No follow-ups on file.  Nicolette Bang, MD  [facility] [place]

## 2019-10-06 NOTE — Patient Instructions (Signed)

## 2019-10-12 DIAGNOSIS — Z79899 Other long term (current) drug therapy: Secondary | ICD-10-CM | POA: Diagnosis not present

## 2019-10-12 DIAGNOSIS — Z125 Encounter for screening for malignant neoplasm of prostate: Secondary | ICD-10-CM | POA: Diagnosis not present

## 2019-10-12 DIAGNOSIS — F1010 Alcohol abuse, uncomplicated: Secondary | ICD-10-CM | POA: Diagnosis not present

## 2019-10-12 DIAGNOSIS — I10 Essential (primary) hypertension: Secondary | ICD-10-CM | POA: Diagnosis not present

## 2019-10-12 DIAGNOSIS — N183 Chronic kidney disease, stage 3 unspecified: Secondary | ICD-10-CM | POA: Diagnosis not present

## 2019-10-12 DIAGNOSIS — R739 Hyperglycemia, unspecified: Secondary | ICD-10-CM | POA: Diagnosis not present

## 2019-10-19 DIAGNOSIS — N183 Chronic kidney disease, stage 3 unspecified: Secondary | ICD-10-CM | POA: Diagnosis not present

## 2019-10-19 DIAGNOSIS — E785 Hyperlipidemia, unspecified: Secondary | ICD-10-CM | POA: Diagnosis not present

## 2019-10-19 DIAGNOSIS — G9009 Other idiopathic peripheral autonomic neuropathy: Secondary | ICD-10-CM | POA: Diagnosis not present

## 2019-10-19 DIAGNOSIS — I10 Essential (primary) hypertension: Secondary | ICD-10-CM | POA: Diagnosis not present

## 2019-11-18 DIAGNOSIS — G4731 Primary central sleep apnea: Secondary | ICD-10-CM | POA: Diagnosis not present

## 2020-01-27 ENCOUNTER — Other Ambulatory Visit: Payer: Self-pay | Admitting: Neurology

## 2020-02-14 DIAGNOSIS — Z20828 Contact with and (suspected) exposure to other viral communicable diseases: Secondary | ICD-10-CM | POA: Diagnosis not present

## 2020-02-24 DIAGNOSIS — G4731 Primary central sleep apnea: Secondary | ICD-10-CM | POA: Diagnosis not present

## 2020-03-27 DIAGNOSIS — Z23 Encounter for immunization: Secondary | ICD-10-CM | POA: Diagnosis not present

## 2020-03-27 DIAGNOSIS — I10 Essential (primary) hypertension: Secondary | ICD-10-CM | POA: Diagnosis not present

## 2020-03-27 DIAGNOSIS — F1021 Alcohol dependence, in remission: Secondary | ICD-10-CM | POA: Diagnosis not present

## 2020-04-03 ENCOUNTER — Encounter: Payer: Self-pay | Admitting: Internal Medicine

## 2020-04-29 ENCOUNTER — Other Ambulatory Visit: Payer: Self-pay | Admitting: Neurology

## 2020-05-25 DIAGNOSIS — F1021 Alcohol dependence, in remission: Secondary | ICD-10-CM | POA: Diagnosis not present

## 2020-05-25 DIAGNOSIS — Z79899 Other long term (current) drug therapy: Secondary | ICD-10-CM | POA: Diagnosis not present

## 2020-05-25 DIAGNOSIS — I10 Essential (primary) hypertension: Secondary | ICD-10-CM | POA: Diagnosis not present

## 2020-05-26 DIAGNOSIS — G4731 Primary central sleep apnea: Secondary | ICD-10-CM | POA: Diagnosis not present

## 2020-06-01 DIAGNOSIS — F1020 Alcohol dependence, uncomplicated: Secondary | ICD-10-CM | POA: Diagnosis not present

## 2020-06-01 DIAGNOSIS — K625 Hemorrhage of anus and rectum: Secondary | ICD-10-CM | POA: Diagnosis not present

## 2020-06-01 DIAGNOSIS — N1831 Chronic kidney disease, stage 3a: Secondary | ICD-10-CM | POA: Diagnosis not present

## 2020-07-03 DIAGNOSIS — I10 Essential (primary) hypertension: Secondary | ICD-10-CM | POA: Diagnosis not present

## 2020-07-03 DIAGNOSIS — F1020 Alcohol dependence, uncomplicated: Secondary | ICD-10-CM | POA: Diagnosis not present

## 2020-07-31 ENCOUNTER — Other Ambulatory Visit: Payer: Self-pay | Admitting: Neurology

## 2020-08-14 DIAGNOSIS — H1032 Unspecified acute conjunctivitis, left eye: Secondary | ICD-10-CM | POA: Diagnosis not present

## 2020-09-05 DIAGNOSIS — G4731 Primary central sleep apnea: Secondary | ICD-10-CM | POA: Diagnosis not present

## 2020-09-25 DIAGNOSIS — F1020 Alcohol dependence, uncomplicated: Secondary | ICD-10-CM | POA: Diagnosis not present

## 2020-09-25 DIAGNOSIS — N183 Chronic kidney disease, stage 3 unspecified: Secondary | ICD-10-CM | POA: Diagnosis not present

## 2020-09-25 DIAGNOSIS — Z79899 Other long term (current) drug therapy: Secondary | ICD-10-CM | POA: Diagnosis not present

## 2020-09-25 DIAGNOSIS — E875 Hyperkalemia: Secondary | ICD-10-CM | POA: Diagnosis not present

## 2020-10-02 DIAGNOSIS — F1021 Alcohol dependence, in remission: Secondary | ICD-10-CM | POA: Diagnosis not present

## 2020-10-02 DIAGNOSIS — N1831 Chronic kidney disease, stage 3a: Secondary | ICD-10-CM | POA: Diagnosis not present

## 2020-10-02 DIAGNOSIS — K429 Umbilical hernia without obstruction or gangrene: Secondary | ICD-10-CM | POA: Diagnosis not present

## 2020-10-11 ENCOUNTER — Ambulatory Visit: Payer: BC Managed Care – PPO | Admitting: Urology

## 2020-10-11 ENCOUNTER — Other Ambulatory Visit: Payer: Self-pay

## 2020-10-11 VITALS — BP 146/94 | HR 71 | Temp 97.8°F | Ht 75.0 in | Wt 262.0 lb

## 2020-10-11 DIAGNOSIS — R3912 Poor urinary stream: Secondary | ICD-10-CM

## 2020-10-11 DIAGNOSIS — R351 Nocturia: Secondary | ICD-10-CM

## 2020-10-11 DIAGNOSIS — N401 Enlarged prostate with lower urinary tract symptoms: Secondary | ICD-10-CM

## 2020-10-11 DIAGNOSIS — N138 Other obstructive and reflux uropathy: Secondary | ICD-10-CM

## 2020-10-11 LAB — MICROSCOPIC EXAMINATION
Bacteria, UA: NONE SEEN
Epithelial Cells (non renal): NONE SEEN /hpf (ref 0–10)
RBC, Urine: NONE SEEN /hpf (ref 0–2)
Renal Epithel, UA: NONE SEEN /hpf
WBC, UA: NONE SEEN /hpf (ref 0–5)

## 2020-10-11 LAB — URINALYSIS, ROUTINE W REFLEX MICROSCOPIC
Bilirubin, UA: NEGATIVE
Glucose, UA: NEGATIVE
Ketones, UA: NEGATIVE
Leukocytes,UA: NEGATIVE
Nitrite, UA: NEGATIVE
RBC, UA: NEGATIVE
Specific Gravity, UA: 1.02 (ref 1.005–1.030)
Urobilinogen, Ur: 0.2 mg/dL (ref 0.2–1.0)
pH, UA: 6 (ref 5.0–7.5)

## 2020-10-11 LAB — BLADDER SCAN AMB NON-IMAGING: Scan Result: 58

## 2020-10-11 MED ORDER — SILODOSIN 8 MG PO CAPS: 8.0000 mg | ORAL_CAPSULE | Freq: Every day | ORAL | 11 refills | Status: DC

## 2020-10-11 MED ORDER — CIPROFLOXACIN HCL 500 MG PO TABS
500.0000 mg | ORAL_TABLET | Freq: Once | ORAL | Status: AC
  Administered 2020-10-11: 500 mg via ORAL

## 2020-10-11 NOTE — Progress Notes (Signed)
post void residual=58  Urological Symptom Review  Patient is experiencing the following symptoms: Frequent urination Get up at night to urinate Stream starts and stops Have to strain to urinate Weak stream Erection problems (male only) Penile pain (male only)    Review of Systems  Gastrointestinal (upper)  : Negative for upper GI symptoms  Gastrointestinal (lower) : Diarrhea  Constitutional : Negative for symptoms  Skin: Negative for skin symptoms  Eyes: Blurred vision  Ear/Nose/Throat : Sinus problems  Hematologic/Lymphatic: Negative for Hematologic/Lymphatic symptoms  Cardiovascular : Negative for cardiovascular symptoms  Respiratory : Negative for respiratory symptoms  Endocrine: Negative for endocrine symptoms  Musculoskeletal: Back pain  Neurological: Negative for neurological symptoms  Psychologic: Anxiety

## 2020-10-11 NOTE — Progress Notes (Addendum)
10/11/2020 9:29 AM   Tony Patterson 07/24/1955 [id number]  Referring provider: Carylon Perches, MD [address] [place],  [place] [address]  followup nocturia  HPI: Tony Patterson is a 64yo here for followup BPH, nocturia and weak urinary stream. IPSS 30 QOL 3. He is unhappy with his urination.  He is currently on flomax BID. No dizziness with the flomax. No hematuria or dysuria   PMH: Past Medical History:  Diagnosis Date   Alcohol abuse    Anemia    Anxiety    Hypertension    Neuropathy    Seizures (HCC)    unknown etiology and on no meds; been 4 years since seizure.    Surgical History: Past Surgical History:  Procedure Laterality Date   COLONOSCOPY  05/18/2007   VFI:EPPIRJJO hemorrhoids, a single anal papilla, otherwise normal/ Single polyp, as described above, removed    COLONOSCOPY WITH PROPOFOL N/A 03/16/2015   Procedure: COLONOSCOPY WITH PROPOFOL;  Surgeon: Corbin Ade, MD;  Location: [facility];  Service: Endoscopy;  Laterality: N/A;  in cecum at 1344. withdrawal time    THROAT SURGERY     polyps removed x2    Home Medications:  Allergies as of 10/11/2020       Reactions   Bee Venom Anaphylaxis   Penicillins Other (See Comments)   Childhood allergic reaction - no other information available        Medication List        Accurate as of Oct 11, 2020  9:29 AM. If you have any questions, ask your nurse or doctor.          STOP taking these medications    CLEAR EYES OP Stopped by: [name], MD   magnesium oxide 400 (241.3 Mg) MG tablet Commonly known as: MAG-OX Stopped by: [name], MD       TAKE these medications    aspirin EC 81 MG tablet Take 81 mg by mouth daily.   atorvastatin 80 MG tablet Commonly known as: LIPITOR Take 40 mg by mouth daily at 6 PM.   b complex vitamins capsule Take 1 capsule by mouth daily.   folic acid 1 MG tablet Commonly known as: FOLVITE Take 1 tablet (1 mg total) by mouth daily.    gabapentin 600 MG tablet Commonly known as: NEURONTIN TAKE TWO TABLETS (1200MG ) BY MOUTH THREETIMES A DAY AS NEEDED   metoprolol tartrate 50 MG tablet Commonly known as: LOPRESSOR Take 50 mg by mouth 2 (two) times daily.   multivitamin with minerals Tabs tablet Take 1 tablet by mouth daily.   omeprazole 20 MG capsule Commonly known as: PRILOSEC Take 20 mg by mouth daily.   sildenafil 100 MG tablet Commonly known as: VIAGRA Take by mouth.   sodium chloride 0.65 % Soln nasal spray Commonly known as: OCEAN Place 1 spray into both nostrils at bedtime as needed and may repeat dose one time if needed for congestion.   tamsulosin 0.4 MG Caps capsule Commonly known as: FLOMAX Take 1 capsule (0.4 mg total) by mouth in the morning and at bedtime.   traZODone 100 MG tablet Commonly known as: DESYREL Take 1 tablet (100 mg total) by mouth at bedtime as needed.        Allergies:  Allergies  Allergen Reactions   Bee Venom Anaphylaxis   Penicillins Other (See Comments)    Childhood allergic reaction - no other information available    Family History: Family History  Problem Relation Age of  Onset   Fibromyalgia Mother    Dementia Mother    Congestive Heart Failure Mother    Congestive Heart Failure Father    Coronary artery disease Father 59       CABG 3 different times   Hypertension Brother    CAD Brother 24   Hypertension Brother    CAD Brother 67   Neuropathy Neg Hx    Colon cancer Neg Hx     Social History:  reports that he quit smoking about 25 years ago. His smoking use included cigarettes. He has a 10.00 pack-year smoking history. He has never used smokeless tobacco. He reports previous alcohol use. He reports that he does not use drugs.  ROS: All other review of systems were reviewed and are negative except what is noted above in HPI  Physical Exam: BP (!) 146/94   Pulse 71   Temp 97.8 F (36.6 C)   Ht 6\' 3"  (1.905 m)   Wt 262 lb (118.8 kg)   BMI  32.75 kg/m   Constitutional:  Alert and oriented, No acute distress. HEENT: [place] AT, moist mucus membranes.  Trachea midline, no masses. Cardiovascular: No clubbing, cyanosis, or edema. Respiratory: Normal respiratory effort, no increased work of breathing. GI: Abdomen is soft, nontender, nondistended, no abdominal masses GU: No CVA tenderness.  Lymph: No cervical or inguinal lymphadenopathy. Skin: No rashes, bruises or suspicious lesions. Neurologic: Grossly intact, no focal deficits, moving all 4 extremities. Psychiatric: Normal mood and affect.  Laboratory Data: Lab Results  Component Value Date   WBC 6.4 11/25/2017   HGB 12.5 (L) 11/25/2017   HCT 40.5 11/25/2017   MCV 91 11/25/2017   PLT 242 11/25/2017    Lab Results  Component Value Date   CREATININE 1.42 (H) 11/25/2017    No results found for: PSA  No results found for: TESTOSTERONE  Lab Results  Component Value Date   HGBA1C 5.6 08/24/2015    Urinalysis    Component Value Date/Time   COLORURINE YELLOW 04/12/2016 1046   APPEARANCEUR HAZY (A) 04/12/2016 1046   LABSPEC 1.025 04/12/2016 1046   PHURINE 6.5 04/12/2016 1046   GLUCOSEU NEGATIVE 04/12/2016 1046   HGBUR LARGE (A) 04/12/2016 1046   BILIRUBINUR MODERATE (A) 04/12/2016 1046   KETONESUR 15 (A) 04/12/2016 1046   PROTEINUR >300 (A) 04/12/2016 1046   UROBILINOGEN 0.2 05/06/2013 1015   NITRITE NEGATIVE 04/12/2016 1046   LEUKOCYTESUR NEGATIVE 04/12/2016 1046    Lab Results  Component Value Date   BACTERIA FEW (A) 04/12/2016    Pertinent Imaging:  No results found for this or any previous visit.  No results found for this or any previous visit.  No results found for this or any previous visit.  No results found for this or any previous visit.  No results found for this or any previous visit.  No results found for this or any previous visit.  No results found for this or any previous visit.  No results found for this or any previous  visit.   Assessment & Plan:    1. Benign prostatic hyperplasia with urinary obstruction -we will start rapaflo 8mg  daily - Urinalysis, Routine w reflex microscopic - BLADDER SCAN AMB NON-IMAGING  2. Nocturia -rapaflo 8mg  daily  3. Weak urinary stream -rapaflo 8mg  daily   No follow-ups on file.  13/03/2016, MD  [facility] [place]    10/11/20   Cystoscopy Procedure Note  Patient identification was confirmed, informed consent was obtained, and patient  was prepped using Betadine solution.  Lidocaine jelly was administered per urethral meatus.     Pre-Procedure: - Inspection reveals a normal caliber ureteral meatus.  Procedure: The flexible cystoscope was introduced without difficulty - No urethral strictures/lesions are present. - Enlarged prostate bilobar hyperplasia, no median lobe - Elevated bladder neck - Bilateral ureteral orifices identified - Bladder mucosa  reveals no ulcers, tumors, or lesions - No bladder stones - No trabeculation  Retroflexion shows no intravesical prostatic protrusion   Post-Procedure: - Patient tolerated the procedure well  Assessment/ Plan: We discussed the management of his BPH including continued medical therapy, Rezum, Urolift, TURP and simple prostatectomy. After discussing the options the patient has elected to proceed with medical therapy. We will start rapaflo 8mg  daily. Risks/benefits/alternatives discussed.  , MD

## 2020-10-17 ENCOUNTER — Encounter: Payer: Self-pay | Admitting: Urology

## 2020-10-17 NOTE — Patient Instructions (Signed)

## 2020-10-26 ENCOUNTER — Other Ambulatory Visit: Payer: Self-pay | Admitting: Urology

## 2020-10-26 ENCOUNTER — Other Ambulatory Visit: Payer: Self-pay | Admitting: Neurology

## 2020-12-05 DIAGNOSIS — G4731 Primary central sleep apnea: Secondary | ICD-10-CM | POA: Diagnosis not present

## 2021-01-17 ENCOUNTER — Ambulatory Visit: Payer: BC Managed Care – PPO | Admitting: Urology

## 2021-01-24 ENCOUNTER — Ambulatory Visit: Payer: BC Managed Care – PPO | Admitting: Urology

## 2021-01-24 ENCOUNTER — Other Ambulatory Visit: Payer: Self-pay

## 2021-01-24 ENCOUNTER — Encounter: Payer: Self-pay | Admitting: Urology

## 2021-01-24 VITALS — BP 105/68 | HR 67

## 2021-01-24 DIAGNOSIS — R351 Nocturia: Secondary | ICD-10-CM | POA: Diagnosis not present

## 2021-01-24 DIAGNOSIS — R3912 Poor urinary stream: Secondary | ICD-10-CM

## 2021-01-24 DIAGNOSIS — N138 Other obstructive and reflux uropathy: Secondary | ICD-10-CM

## 2021-01-24 DIAGNOSIS — N401 Enlarged prostate with lower urinary tract symptoms: Secondary | ICD-10-CM

## 2021-01-24 LAB — URINALYSIS, ROUTINE W REFLEX MICROSCOPIC
Bilirubin, UA: NEGATIVE
Glucose, UA: NEGATIVE
Ketones, UA: NEGATIVE
Leukocytes,UA: NEGATIVE
Nitrite, UA: NEGATIVE
RBC, UA: NEGATIVE
Specific Gravity, UA: 1.015 (ref 1.005–1.030)
Urobilinogen, Ur: 0.2 mg/dL (ref 0.2–1.0)
pH, UA: 6 (ref 5.0–7.5)

## 2021-01-24 LAB — MICROSCOPIC EXAMINATION
Bacteria, UA: NONE SEEN
RBC, Urine: NONE SEEN /hpf (ref 0–2)
Renal Epithel, UA: NONE SEEN /hpf
WBC, UA: NONE SEEN /hpf (ref 0–5)

## 2021-01-24 LAB — BLADDER SCAN AMB NON-IMAGING: Scan Result: 101

## 2021-01-24 NOTE — Patient Instructions (Signed)
Benign Prostatic Hyperplasia  Benign prostatic hyperplasia (BPH) is an enlarged prostate gland that is caused by the normal aging process and not by cancer. The prostate is a walnut-sized gland that is involved in the production of semen. It is located in front of the rectum and below the bladder. The bladder stores urine and the urethra is the tube that carries the urine out of the body. The prostate may get bigger asa man gets older. An enlarged prostate can press on the urethra. This can make it harder to pass urine. The build-up of urine in the bladder can cause infection. Back pressure and infection may progress to bladder damage and kidney (renal) failure. What are the causes? This condition is part of a normal aging process. However, not all men develop problems from this condition. If the prostate enlarges away from the urethra, urine flow will not be blocked. If it enlarges toward the urethra andcompresses it, there will be problems passing urine. What increases the risk? This condition is more likely to develop in men over the age of 50 years. What are the signs or symptoms? Symptoms of this condition include: Getting up often during the night to urinate. Needing to urinate frequently during the day. Difficulty starting urine flow. Decrease in size and strength of your urine stream. Leaking (dribbling) after urinating. Inability to pass urine. This needs immediate treatment. Inability to completely empty your bladder. Pain when you pass urine. This is more common if there is also an infection. Urinary tract infection (UTI). How is this diagnosed? This condition is diagnosed based on your medical history, a physical exam, and your symptoms. Tests will also be done, such as: A post-void bladder scan. This measures any amount of urine that may remain in your bladder after you finish urinating. A digital rectal exam. In a rectal exam, your health care provider checks your prostate by  putting a lubricated, gloved finger into your rectum to feel the back of your prostate gland. This exam detects the size of your gland and any abnormal lumps or growths. An exam of your urine (urinalysis). A prostate specific antigen (PSA) screening. This is a blood test used to screen for prostate cancer. An ultrasound. This test uses sound waves to electronically produce a picture of your prostate gland. Your health care provider may refer you to a specialist in kidney and prostate diseases (urologist). How is this treated? Once symptoms begin, your health care provider will monitor your condition (active surveillance or watchful waiting). Treatment for this condition will depend on the severity of your condition. Treatment may include: Observation and yearly exams. This may be the only treatment needed if your condition and symptoms are mild. Medicines to relieve your symptoms, including: Medicines to shrink the prostate. Medicines to relax the muscle of the prostate. Surgery in severe cases. Surgery may include: Prostatectomy. In this procedure, the prostate tissue is removed completely through an open incision or with a laparoscope or robotics. Transurethral resection of the prostate (TURP). In this procedure, a tool is inserted through the opening at the tip of the penis (urethra). It is used to cut away tissue of the inner core of the prostate. The pieces are removed through the same opening of the penis. This removes the blockage. Transurethral incision (TUIP). In this procedure, small cuts are made in the prostate. This lessens the prostate's pressure on the urethra. Transurethral microwave thermotherapy (TUMT). This procedure uses microwaves to create heat. The heat destroys and removes a small   amount of prostate tissue. Transurethral needle ablation (TUNA). This procedure uses radio frequencies to destroy and remove a small amount of prostate tissue. Interstitial laser coagulation (ILC).  This procedure uses a laser to destroy and remove a small amount of prostate tissue. Transurethral electrovaporization (TUVP). This procedure uses electrodes to destroy and remove a small amount of prostate tissue. Prostatic urethral lift. This procedure inserts an implant to push the lobes of the prostate away from the urethra. Follow these instructions at home: Take over-the-counter and prescription medicines only as told by your health care provider. Monitor your symptoms for any changes. Contact your health care provider with any changes. Avoid drinking large amounts of liquid before going to bed or out in public. Avoid or reduce how much caffeine or alcohol you drink. Give yourself time when you urinate. Keep all follow-up visits as told by your health care provider. This is important. Contact a health care provider if: You have unexplained back pain. Your symptoms do not get better with treatment. You develop side effects from the medicine you are taking. Your urine becomes very dark or has a bad smell. Your lower abdomen becomes distended and you have trouble passing your urine. Get help right away if: You have a fever or chills. You suddenly cannot urinate. You feel lightheaded, or very dizzy, or you faint. There are large amounts of blood or clots in the urine. Your urinary problems become hard to manage. You develop moderate to severe low back or flank pain. The flank is the side of your body between the ribs and the hip. These symptoms may represent a serious problem that is an emergency. Do not wait to see if the symptoms will go away. Get medical help right away. Call your local emergency services (911 in the [place] Do not drive yourself to the hospital. Summary Benign prostatic hyperplasia (BPH) is an enlarged prostate that is caused by the normal aging process and not by cancer. An enlarged prostate can press on the urethra. This can make it hard to pass urine. This  condition is part of a normal aging process and is more likely to develop in men over the age of 50 years. Get help right away if you suddenly cannot urinate. This information is not intended to replace advice given to you by your health care provider. Make sure you discuss any questions you have with your healthcare provider. Document Revised: 01/27/2020 Document Reviewed: 01/27/2020 Elsevier Patient Education  2022 [employer] Inc[employer]  

## 2021-01-24 NOTE — Progress Notes (Signed)
01/24/2021 9:13 AM   Tony Patterson July 05, 1955 [id number]  Referring provider: Carylon Perches, MD [address] [place],  [place] [address]  Followup BPH   HPI: Tony Patterson is a [id number] here ofr followup for BPH with a week stream. He was started on rapaflo 8mg  last visit and his stream became weaker. He stopped rapaflo and restarted flomax BID. IPSS 23 QOL 4.    PMH: Past Medical History:  Diagnosis Date   Alcohol abuse    Anemia    Anxiety    Hypertension    Neuropathy    Seizures (HCC)    unknown etiology and on no meds; been 4 years since seizure.    Surgical History: Past Surgical History:  Procedure Laterality Date   COLONOSCOPY  05/18/2007   05/20/2007 hemorrhoids, a single anal papilla, otherwise normal/ Single polyp, as described above, removed    COLONOSCOPY WITH PROPOFOL N/A 03/16/2015   Procedure: COLONOSCOPY WITH PROPOFOL;  Surgeon: 03/18/2015, MD;  Location: [facility];  Service: Endoscopy;  Laterality: N/A;  in cecum at 1344. withdrawal time  Corbin Ade   THROAT SURGERY     polyps removed x2    Home Medications:  Allergies as of 01/24/2021       Reactions   Bee Venom Anaphylaxis   Penicillins Other (See Comments)   Childhood allergic reaction - no other information available        Medication List        Accurate as of January 24, 2021  9:13 AM. If you have any questions, ask your nurse or doctor.          aspirin EC 81 MG tablet Take 81 mg by mouth daily.   atorvastatin 80 MG tablet Commonly known as: LIPITOR Take 40 mg by mouth daily at 6 PM.   b complex vitamins capsule Take 1 capsule by mouth daily.   folic acid 1 MG tablet Commonly known as: FOLVITE Take 1 tablet (1 mg total) by mouth daily.   gabapentin 600 MG tablet Commonly known as: NEURONTIN TAKE TWO TABLETS (1200MG ) BY MOUTH THREETIMES A DAY AS NEEDED   metoprolol tartrate 50 MG tablet Commonly known as: LOPRESSOR Take 50 mg by mouth 2 (two) times daily.   multivitamin  with minerals Tabs tablet Take 1 tablet by mouth daily.   omeprazole 20 MG capsule Commonly known as: PRILOSEC Take 20 mg by mouth daily.   sildenafil 100 MG tablet Commonly known as: VIAGRA Take by mouth.   silodosin 8 MG Caps capsule Commonly known as: RAPAFLO Take 1 capsule (8 mg total) by mouth daily with breakfast.   sodium chloride 0.65 % Soln nasal spray Commonly known as: OCEAN Place 1 spray into both nostrils at bedtime as needed and may repeat dose one time if needed for congestion.   tamsulosin 0.4 MG Caps capsule Commonly known as: FLOMAX TAKE 1 CAPSULE BY MOUTH IN THE MORNING AND 1 AT BEDTIME   traZODone 100 MG tablet Commonly known as: DESYREL Take 1 tablet (100 mg total) by mouth at bedtime as needed.        Allergies:  Allergies  Allergen Reactions   Bee Venom Anaphylaxis   Penicillins Other (See Comments)    Childhood allergic reaction - no other information available    Family History: Family History  Problem Relation Age of Onset   Fibromyalgia Mother    Dementia Mother    Congestive Heart Failure Mother    Congestive Heart Failure Father  Coronary artery disease Father 55       CABG 3 different times   Hypertension Brother    CAD Brother 39   Hypertension Brother    CAD Brother 32   Neuropathy Neg Hx    Colon cancer Neg Hx     Social History:  reports that he quit smoking about 25 years ago. His smoking use included cigarettes. He has a 10.00 pack-year smoking history. He has never used smokeless tobacco. He reports that he does not currently use alcohol. He reports that he does not use drugs.  ROS: All other review of systems were reviewed and are negative except what is noted above in HPI  Physical Exam: BP 105/68   Pulse 67   Constitutional:  Alert and oriented, No acute distress. HEENT: [place] AT, moist mucus membranes.  Trachea midline, no masses. Cardiovascular: No clubbing, cyanosis, or edema. Respiratory: Normal respiratory  effort, no increased work of breathing. GI: Abdomen is soft, nontender, nondistended, no abdominal masses GU: No CVA tenderness.  Lymph: No cervical or inguinal lymphadenopathy. Skin: No rashes, bruises or suspicious lesions. Neurologic: Grossly intact, no focal deficits, moving all 4 extremities. Psychiatric: Normal mood and affect.  Laboratory Data: Lab Results  Component Value Date   WBC 6.4 11/25/2017   HGB 12.5 (L) 11/25/2017   HCT 40.5 11/25/2017   MCV 91 11/25/2017   PLT 242 11/25/2017    Lab Results  Component Value Date   CREATININE 1.42 (H) 11/25/2017    No results found for: PSA  No results found for: TESTOSTERONE  Lab Results  Component Value Date   HGBA1C 5.6 08/24/2015    Urinalysis    Component Value Date/Time   COLORURINE YELLOW 04/12/2016 1046   APPEARANCEUR Clear 10/11/2020 0857   LABSPEC 1.025 04/12/2016 1046   PHURINE 6.5 04/12/2016 1046   GLUCOSEU Negative 10/11/2020 0857   HGBUR LARGE (A) 04/12/2016 1046   BILIRUBINUR Negative 10/11/2020 0857   KETONESUR 15 (A) 04/12/2016 1046   PROTEINUR 2+ (A) 10/11/2020 0857   PROTEINUR >300 (A) 04/12/2016 1046   UROBILINOGEN 0.2 05/06/2013 1015   NITRITE Negative 10/11/2020 0857   NITRITE NEGATIVE 04/12/2016 1046   LEUKOCYTESUR Negative 10/11/2020 0857    Lab Results  Component Value Date   LABMICR See below: 10/11/2020   WBCUA None seen 10/11/2020   LABEPIT None seen 10/11/2020   MUCUS Present 10/11/2020   BACTERIA None seen 10/11/2020    Pertinent Imaging:  No results found for this or any previous visit.  No results found for this or any previous visit.  No results found for this or any previous visit.  No results found for this or any previous visit.  No results found for this or any previous visit.  No results found for this or any previous visit.  No results found for this or any previous visit.  No results found for this or any previous visit.   Assessment & Plan:    1.  Benign prostatic hyperplasia with urinary obstruction -We discussed the management of his BPH including continued medical therapy, Rezum, Urolift, TURP and simple prostatectomy. After discussing the options the patient has elected to proceed with Urolift. Risks/benefits/alternatives discussed.  - Urinalysis, Routine w reflex microscopic - BLADDER SCAN AMB NON-IMAGING  2. Nocturia -Schedule for Urolift     No follow-ups on file.  [name], MD  [facility] [place]

## 2021-01-24 NOTE — Progress Notes (Signed)
post void residual=101  Urological Symptom Review  Patient is experiencing the following symptoms: Get up at night to urinate Leakage of urine Stream starts and stops Trouble starting stream Have to strain to urinate Weak stream Erection problems (male only)   Review of Systems  Gastrointestinal (upper)  : Negative for upper GI symptoms  Gastrointestinal (lower) : Diarrhea  Constitutional : Negative for symptoms  Skin: Negative for skin symptoms  Eyes: Blurred vision  Ear/Nose/Throat : Negative for Ear/Nose/Throat symptoms  Hematologic/Lymphatic: Negative for Hematologic/Lymphatic symptoms  Cardiovascular : Negative for cardiovascular symptoms  Respiratory : Shortness of breath  Endocrine: Negative for endocrine symptoms  Musculoskeletal: Back pain  Neurological: Dizziness  Psychologic: Negative for psychiatric symptoms

## 2021-01-25 ENCOUNTER — Telehealth: Payer: Self-pay

## 2021-01-25 NOTE — Telephone Encounter (Signed)
Scheduled surgery with patient for 09/19

## 2021-01-25 NOTE — Telephone Encounter (Signed)
Called patient to scheduled surgery for Urolift. No answer. Left message to return call to office.

## 2021-01-31 ENCOUNTER — Telehealth: Payer: Self-pay

## 2021-01-31 ENCOUNTER — Other Ambulatory Visit: Payer: BC Managed Care – PPO

## 2021-01-31 ENCOUNTER — Other Ambulatory Visit: Payer: Self-pay

## 2021-01-31 DIAGNOSIS — N401 Enlarged prostate with lower urinary tract symptoms: Secondary | ICD-10-CM

## 2021-01-31 DIAGNOSIS — N138 Other obstructive and reflux uropathy: Secondary | ICD-10-CM

## 2021-01-31 NOTE — Telephone Encounter (Signed)
Received call from [facility] needing PSA for pending PA for urolift. Patient scheduled today for lab work.

## 2021-02-01 LAB — PSA: Prostate Specific Ag, Serum: 1.4 ng/mL (ref 0.0–4.0)

## 2021-02-08 NOTE — Progress Notes (Signed)
Sent via mychart

## 2021-02-09 ENCOUNTER — Other Ambulatory Visit: Payer: Self-pay | Admitting: Urology

## 2021-02-09 ENCOUNTER — Other Ambulatory Visit: Payer: Self-pay

## 2021-02-09 DIAGNOSIS — N138 Other obstructive and reflux uropathy: Secondary | ICD-10-CM

## 2021-02-09 DIAGNOSIS — N401 Enlarged prostate with lower urinary tract symptoms: Secondary | ICD-10-CM

## 2021-02-09 NOTE — Progress Notes (Signed)
Insurance denied urolift due to patient not have prostate volume by ultrasound to confirm prostate less than 80 grams.   Order placed. Patient called and left message that  he will need prostate u/s completed.

## 2021-02-14 ENCOUNTER — Ambulatory Visit (HOSPITAL_COMMUNITY)
Admission: RE | Admit: 2021-02-14 | Discharge: 2021-02-14 | Disposition: A | Discharge status: Home / Self Care | Payer: BC Managed Care – PPO | Source: Ambulatory Visit | Attending: Urology | Admitting: Urology

## 2021-02-14 ENCOUNTER — Other Ambulatory Visit: Payer: Self-pay

## 2021-02-14 DIAGNOSIS — N401 Enlarged prostate with lower urinary tract symptoms: Secondary | ICD-10-CM | POA: Insufficient documentation

## 2021-02-14 DIAGNOSIS — N138 Other obstructive and reflux uropathy: Secondary | ICD-10-CM | POA: Diagnosis present

## 2021-02-15 ENCOUNTER — Encounter (HOSPITAL_COMMUNITY): Payer: BC Managed Care – PPO

## 2021-02-21 ENCOUNTER — Ambulatory Visit: Payer: BC Managed Care – PPO | Admitting: Urology

## 2021-02-21 DIAGNOSIS — N138 Other obstructive and reflux uropathy: Secondary | ICD-10-CM

## 2021-02-22 ENCOUNTER — Telehealth: Payer: Self-pay

## 2021-02-22 ENCOUNTER — Telehealth: Payer: Self-pay | Admitting: Urology

## 2021-02-22 NOTE — Telephone Encounter (Signed)
STUDY DATE: 02/14/2021 PATIENT NAME: Tony Patterson DOB: [birth year] MRN: [id number]   EXAM:Transrectal [place]    ORDERING CLINICIAN: [name] M.D.  CLINICAL HISTORY: 65 year old man with BPH who is wishing to proceed with surgery for BPH  COMPARISON FILMS: none   TECHNIQUE: Transrectal [place] was obtained   CONTRAST: None IMAGING SITE: [facility]   FINDINGS: Normal Seminal vesicals. Multiple hyperechoic area at base consistent with calcifications. 32.4cc volume   IMPRESSION:   32.4cc prostate, normal prostate [place]

## 2021-02-22 NOTE — Telephone Encounter (Signed)
Prostate u/s sent to [facility] for pending authorization for upcoming urolift

## 2021-02-26 ENCOUNTER — Telehealth: Payer: Self-pay

## 2021-02-26 NOTE — Telephone Encounter (Signed)
All necessary documentation faxed to [facility] as needed for PA for Urolift. Awaiting decision for surgery approval.

## 2021-02-28 NOTE — Patient Instructions (Signed)
Tony Patterson  02/28/2021     @PREFPERIOPPHARMACY @   Your procedure is scheduled on  03/05/2021.   Report to [facility] at  0930 A.M.   Call this number if you have problems the morning of surgery:  [phone]   Remember:  Do not eat or drink after midnight.      Take these medicines the morning of surgery with A SIP OF WATER      Ativan (if needed), omeprazole, flomax.     Do not wear jewelry, make-up or nail polish.  Do not wear lotions, powders, or perfumes, or deodorant.  Do not shave 48 hours prior to surgery.  Men may shave face and neck.  Do not bring valuables to the hospital.  [facility] is not responsible for any belongings or valuables.  Contacts, dentures or bridgework may not be worn into surgery.  Leave your suitcase in the car.  After surgery it may be brought to your room.  For patients admitted to the hospital, discharge time will be determined by your treatment team.  Patients discharged the day of surgery will not be allowed to drive home and must have someone with them for 24 hours.    Special instructions:   DO NOT smoke tobacco or vape for 24 hours before your procedure.  Please read over the following fact sheets that you were given. Coughing and Deep Breathing, Surgical Site Infection Prevention, Anesthesia Post-op Instructions, and Care and Recovery After Surgery      Prostatic Urethral Lift Prostatic urethral lift is a surgical procedure to relieve symptoms of prostate gland enlargement that occurs with age (benign prostatic hypertrophy, BPH). The part of the body that drains urine from the bladder (urethra) passes between the two lobes of the prostate. As the prostate enlarges, it can push on the urethra and cause problems with urinating. This procedure involves placing an implant that holds the prostate away from the urethra. The procedure is performed with a thin operating telescope (cystoscope) that is inserted through the tip  of the penis and moved up the urethra to the prostate. This is less invasive than other procedures that require an incision. You may have this procedure if: You have symptoms of BPH. Your prostate is not severely enlarged. Medicines to treat BPH are not working or not tolerated. You want to avoid possible sexual side effects from medicines or other procedures that are used to treat BPH. Tell a health care provider about: Any allergies you have. All medicines you are taking, including vitamins, herbs, eye drops, creams, and over-the-counter medicines. Previous problems you or members of your family have had with the use of anesthetics. Any blood disorders you have. Previous surgeries you have had. Any medical conditions you have. What are the risks? Bleeding. Infection. Leaking of urine (incontinence). Allergic reactions to medicines. Return of BPH symptoms after 2 years, requiring more treatment. What happens before the procedure? Ask your health care provider about: Changing or stopping your regular medicines. This is especially important if you are taking diabetes medicines or blood thinners. Taking medicines such as aspirin and ibuprofen. These medicines can thin your blood. Do not take these medicines before your procedure if your health care provider instructs you not to. Follow instructions from your health care provider about eating or drinking restrictions. Plan to have someone take you home from the hospital or clinic. What happens during the procedure? To lower your risk of infection: Your health  care team will wash or sanitize their hands. Your skin will be washed with soap. You may be given an antibiotic medicine. An IV may be inserted into one of your veins. You will be given one or more of the following: A medicine to help you relax (sedative). A medicine that is injected into your urethra to numb the area (local anesthetic). A medicine to make you fall asleep (general  anesthetic). A cystoscope will be inserted into your penis and moved through your urethra to your prostate. A device will be inserted through the cystoscope and used to press the lobes of your prostate away from your urethra. Implants will be inserted through the device to hold the lobes of your prostate in the widened position. The device and cystoscope will be removed. The procedure may vary among health care providers and hospitals. What happens after the procedure? Your blood pressure, heart rate, breathing rate, and blood oxygen level will be monitored until the medicines you were given have worn off. Do not drive for 24 hours if you were given a sedative. Summary Prostatic urethral lift is a surgical procedure to relieve symptoms of prostate gland enlargement that occurs with age (benign prostatic hypertrophy, BPH). The procedure is performed with a thin operating telescope (cystoscope) that is inserted through the tip of the penis and moved up the part of the body that drains urine from the bladder (urethra) to reach the prostate. This is less invasive than other procedures that require an incision. Plan to have someone take you home from the hospital or clinic. You will not be allowed to drive for 24 hours if you were given a sedative during the procedure. This information is not intended to replace advice given to you by your health care provider. Make sure you discuss any questions you have with your health care provider. Document Revised: 08/30/2020 Document Reviewed: 01/27/2020 Elsevier Patient Education  2022 [employer] General Anesthesia, Adult, Care After This sheet gives you information about how to care for yourself after your procedure. Your health care provider may also give you more specific instructions. If you have problems or questions, contact your health care provider. What can I expect after the procedure? After the procedure, the following side effects are  common: Pain or discomfort at the IV site. Nausea. Vomiting. Sore throat. Trouble concentrating. Feeling cold or chills. Feeling weak or tired. Sleepiness and fatigue. Soreness and body aches. These side effects can affect parts of the body that were not involved in surgery. Follow these instructions at home: For the time period you were told by your health care provider:  Rest. Do not participate in activities where you could fall or become injured. Do not drive or use machinery. Do not drink alcohol. Do not take sleeping pills or medicines that cause drowsiness. Do not make important decisions or sign legal documents. Do not take care of children on your own. Eating and drinking Follow any instructions from your health care provider about eating or drinking restrictions. When you feel hungry, start by eating small amounts of foods that are soft and easy to digest (bland), such as toast. Gradually return to your regular diet. Drink enough fluid to keep your urine pale yellow. If you vomit, rehydrate by drinking water, juice, or clear broth. General instructions If you have sleep apnea, surgery and certain medicines can increase your risk for breathing problems. Follow instructions from your health care provider about wearing your sleep device: Anytime you are sleeping, including during  daytime naps. While taking prescription pain medicines, sleeping medicines, or medicines that make you drowsy. Have a responsible adult stay with you for the time you are told. It is important to have someone help care for you until you are awake and alert. Return to your normal activities as told by your health care provider. Ask your health care provider what activities are safe for you. Take over-the-counter and prescription medicines only as told by your health care provider. If you smoke, do not smoke without supervision. Keep all follow-up visits as told by your health care provider. This is  important. Contact a health care provider if: You have nausea or vomiting that does not get better with medicine. You cannot eat or drink without vomiting. You have pain that does not get better with medicine. You are unable to pass urine. You develop a skin rash. You have a fever. You have redness around your IV site that gets worse. Get help right away if: You have difficulty breathing. You have chest pain. You have blood in your urine or stool, or you vomit blood. Summary After the procedure, it is common to have a sore throat or nausea. It is also common to feel tired. Have a responsible adult stay with you for the time you are told. It is important to have someone help care for you until you are awake and alert. When you feel hungry, start by eating small amounts of foods that are soft and easy to digest (bland), such as toast. Gradually return to your regular diet. Drink enough fluid to keep your urine pale yellow. Return to your normal activities as told by your health care provider. Ask your health care provider what activities are safe for you. This information is not intended to replace advice given to you by your health care provider. Make sure you discuss any questions you have with your health care provider. Document Revised: 02/03/2020 Document Reviewed: 09/02/2019 Elsevier Patient Education  2022 Else[employer] Inc. How to Use Chlorhexidine for Bathing Chlorhexidine gluconate (CHG) is a germ-killing (antiseptic) solution that is used to clean the skin. It can get rid of the bacteria that normally live on the skin and can keep them away for about 24 hours. To clean your skin with CHG, you may be given: A CHG solution to use in the shower or as part of a sponge bath. A prepackaged cloth that contains CHG. Cleaning your skin with CHG may help lower the risk for infection: While you are staying in the intensive care unit of the hospital. If you have a vascular access, such as a  central line, to provide short-term or long-term access to your veins. If you have a catheter to drain urine from your bladder. If you are on a ventilator. A ventilator is a machine that helps you breathe by moving air in and out of your lungs. After surgery. What are the risks? Risks of using CHG include: A skin reaction. Hearing loss, if CHG gets in your ears and you have a perforated eardrum. Eye injury, if CHG gets in your eyes and is not rinsed out. The CHG product catching fire. Make sure that you avoid smoking and flames after applying CHG to your skin. Do not use CHG: If you have a chlorhexidine allergy or have previously reacted to chlorhexidine. On babies younger than 6 months of age. How to use CHG solution Use CHG only as told by your health care provider, and follow the instructions on the label.  Use the full amount of CHG as directed. Usually, this is one bottle. During a shower Follow these steps when using CHG solution during a shower (unless your health care provider gives you different instructions): Start the shower. Use your normal soap and shampoo to wash your face and hair. Turn off the shower or move out of the shower stream. Pour the CHG onto a clean washcloth. Do not use any type of brush or rough-edged sponge. Starting at your neck, lather your body down to your toes. Make sure you follow these instructions: If you will be having surgery, pay special attention to the part of your body where you will be having surgery. Scrub this area for at least 1 minute. Do not use CHG on your head or face. If the solution gets into your ears or eyes, rinse them well with water. Avoid your genital area. Avoid any areas of skin that have broken skin, cuts, or scrapes. Scrub your back and under your arms. Make sure to wash skin folds. Let the lather sit on your skin for 1-2 minutes or as long as told by your health care provider. Thoroughly rinse your entire body in the shower.  Make sure that all body creases and crevices are rinsed well. Dry off with a clean towel. Do not put any substances on your body afterward--such as powder, lotion, or perfume--unless you are told to do so by your health care provider. Only use lotions that are recommended by the manufacturer. Put on clean clothes or pajamas. If it is the night before your surgery, sleep in clean sheets.  During a sponge bath Follow these steps when using CHG solution during a sponge bath (unless your health care provider gives you different instructions): Use your normal soap and shampoo to wash your face and hair. Pour the CHG onto a clean washcloth. Starting at your neck, lather your body down to your toes. Make sure you follow these instructions: If you will be having surgery, pay special attention to the part of your body where you will be having surgery. Scrub this area for at least 1 minute. Do not use CHG on your head or face. If the solution gets into your ears or eyes, rinse them well with water. Avoid your genital area. Avoid any areas of skin that have broken skin, cuts, or scrapes. Scrub your back and under your arms. Make sure to wash skin folds. Let the lather sit on your skin for 1-2 minutes or as long as told by your health care provider. Using a different clean, wet washcloth, thoroughly rinse your entire body. Make sure that all body creases and crevices are rinsed well. Dry off with a clean towel. Do not put any substances on your body afterward--such as powder, lotion, or perfume--unless you are told to do so by your health care provider. Only use lotions that are recommended by the manufacturer. Put on clean clothes or pajamas. If it is the night before your surgery, sleep in clean sheets. How to use CHG prepackaged cloths Only use CHG cloths as told by your health care provider, and follow the instructions on the label. Use the CHG cloth on clean, dry skin. Do not use the CHG cloth on  your head or face unless your health care provider tells you to. When washing with the CHG cloth: Avoid your genital area. Avoid any areas of skin that have broken skin, cuts, or scrapes. Before surgery Follow these steps when using a CHG cloth  to clean before surgery (unless your health care provider gives you different instructions): Using the CHG cloth, vigorously scrub the part of your body where you will be having surgery. Scrub using a back-and-forth motion for 3 minutes. The area on your body should be completely wet with CHG when you are done scrubbing. Do not rinse. Discard the cloth and let the area air-dry. Do not put any substances on the area afterward, such as powder, lotion, or perfume. Put on clean clothes or pajamas. If it is the night before your surgery, sleep in clean sheets.  For general bathing Follow these steps when using CHG cloths for general bathing (unless your health care provider gives you different instructions). Use a separate CHG cloth for each area of your body. Make sure you wash between any folds of skin and between your fingers and toes. Wash your body in the following order, switching to a new cloth after each step: The front of your neck, shoulders, and chest. Both of your arms, under your arms, and your hands. Your stomach and groin area, avoiding the genitals. Your right leg and foot. Your left leg and foot. The back of your neck, your back, and your buttocks. Do not rinse. Discard the cloth and let the area air-dry. Do not put any substances on your body afterward--such as powder, lotion, or perfume--unless you are told to do so by your health care provider. Only use lotions that are recommended by the manufacturer. Put on clean clothes or pajamas. Contact a health care provider if: Your skin gets irritated after scrubbing. You have questions about using your solution or cloth. You swallow any chlorhexidine. Call your local poison control center  [phone])707-7776 in the [place] Get help right away if: Your eyes itch badly, or they become very red or swollen. Your skin itches badly and is red or swollen. Your hearing changes. You have trouble seeing. You have swelling or tingling in your mouth or throat. You have trouble breathing. These symptoms may represent a serious problem that is an emergency. Do not wait to see if the symptoms will go away. Get medical help right away. Call your local emergency services (911 in the [place] Do not drive yourself to the hospital. Summary Chlorhexidine gluconate (CHG) is a germ-killing (antiseptic) solution that is used to clean the skin. Cleaning your skin with CHG may help to lower your risk for infection. You may be given CHG to use for bathing. It may be in a bottle or in a prepackaged cloth to use on your skin. Carefully follow your health care provider's instructions and the instructions on the product label. Do not use CHG if you have a chlorhexidine allergy. Contact your health care provider if your skin gets irritated after scrubbing. This information is not intended to replace advice given to you by your health care provider. Make sure you discuss any questions you have with your health care provider. Document Revised: 07/31/2020 Document Reviewed: 07/31/2020 Elsevier Patient Education  2022 [employer].

## 2021-03-01 ENCOUNTER — Encounter (HOSPITAL_COMMUNITY): Payer: Self-pay

## 2021-03-01 ENCOUNTER — Other Ambulatory Visit: Payer: Self-pay

## 2021-03-01 ENCOUNTER — Encounter (HOSPITAL_COMMUNITY)
Admission: RE | Admit: 2021-03-01 | Discharge: 2021-03-01 | Disposition: A | Discharge status: Home / Self Care | Payer: BC Managed Care – PPO | Source: Ambulatory Visit | Attending: Urology | Admitting: Urology

## 2021-03-01 DIAGNOSIS — Z01818 Encounter for other preprocedural examination: Secondary | ICD-10-CM | POA: Insufficient documentation

## 2021-03-01 HISTORY — DX: Sleep apnea, unspecified: G47.30

## 2021-03-01 LAB — CBC WITH DIFFERENTIAL/PLATELET
Abs Immature Granulocytes: 0.01 10*3/uL (ref 0.00–0.07)
Basophils Absolute: 0 10*3/uL (ref 0.0–0.1)
Basophils Relative: 1 %
Eosinophils Absolute: 0.1 10*3/uL (ref 0.0–0.5)
Eosinophils Relative: 2 %
HCT: 42.3 % (ref 39.0–52.0)
Hemoglobin: 13.5 g/dL (ref 13.0–17.0)
Immature Granulocytes: 0 %
Lymphocytes Relative: 24 %
Lymphs Abs: 1.6 10*3/uL (ref 0.7–4.0)
MCH: 29.5 pg (ref 26.0–34.0)
MCHC: 31.9 g/dL (ref 30.0–36.0)
MCV: 92.6 fL (ref 80.0–100.0)
Monocytes Absolute: 0.5 10*3/uL (ref 0.1–1.0)
Monocytes Relative: 7 %
Neutro Abs: 4.4 10*3/uL (ref 1.7–7.7)
Neutrophils Relative %: 66 %
Platelets: 224 10*3/uL (ref 150–400)
RBC: 4.57 MIL/uL (ref 4.22–5.81)
RDW: 13 % (ref 11.5–15.5)
WBC: 6.6 10*3/uL (ref 4.0–10.5)
nRBC: 0 % (ref 0.0–0.2)

## 2021-03-01 LAB — BASIC METABOLIC PANEL
Anion gap: 7 (ref 5–15)
BUN: 44 mg/dL — ABNORMAL HIGH (ref 8–23)
CO2: 24 mmol/L (ref 22–32)
Calcium: 8.5 mg/dL — ABNORMAL LOW (ref 8.9–10.3)
Chloride: 106 mmol/L (ref 98–111)
Creatinine, Ser: 1.82 mg/dL — ABNORMAL HIGH (ref 0.61–1.24)
GFR, Estimated: 41 mL/min — ABNORMAL LOW (ref >60)
Glucose, Bld: 118 mg/dL — ABNORMAL HIGH (ref 70–99)
Potassium: 4.4 mmol/L (ref 3.5–5.1)
Sodium: 137 mmol/L (ref 135–145)

## 2021-03-05 ENCOUNTER — Ambulatory Visit (HOSPITAL_COMMUNITY): Payer: BC Managed Care – PPO | Admitting: Certified Registered"

## 2021-03-05 ENCOUNTER — Ambulatory Visit (HOSPITAL_COMMUNITY)
Admission: RE | Admit: 2021-03-05 | Discharge: 2021-03-05 | Disposition: A | Discharge status: Home / Self Care | Payer: BC Managed Care – PPO | Attending: Urology | Admitting: Urology

## 2021-03-05 ENCOUNTER — Encounter (HOSPITAL_COMMUNITY): Payer: Self-pay | Admitting: Urology

## 2021-03-05 ENCOUNTER — Encounter (HOSPITAL_COMMUNITY)
Admission: RE | Disposition: A | Discharge status: Home / Self Care | Payer: Self-pay | Source: Home / Self Care | Attending: Urology

## 2021-03-05 DIAGNOSIS — N138 Other obstructive and reflux uropathy: Secondary | ICD-10-CM

## 2021-03-05 DIAGNOSIS — Z880 Allergy status to penicillin: Secondary | ICD-10-CM | POA: Insufficient documentation

## 2021-03-05 DIAGNOSIS — Z86010 Personal history of colonic polyps: Secondary | ICD-10-CM | POA: Insufficient documentation

## 2021-03-05 DIAGNOSIS — Z87891 Personal history of nicotine dependence: Secondary | ICD-10-CM | POA: Insufficient documentation

## 2021-03-05 DIAGNOSIS — N401 Enlarged prostate with lower urinary tract symptoms: Secondary | ICD-10-CM | POA: Insufficient documentation

## 2021-03-05 HISTORY — PX: CYSTOSCOPY WITH INSERTION OF UROLIFT: SHX6678

## 2021-03-05 SURGERY — CYSTOSCOPY WITH INSERTION OF UROLIFT
Anesthesia: General | Site: Prostate

## 2021-03-05 MED ORDER — LIDOCAINE 2% (20 MG/ML) 5 ML SYRINGE
INTRAMUSCULAR | Status: DC | PRN
  Administered 2021-03-05: 80 mg via INTRAVENOUS

## 2021-03-05 MED ORDER — TRAMADOL HCL 50 MG PO TABS
ORAL_TABLET | ORAL | Status: AC
  Administered 2021-03-05: 50 mg via ORAL
  Filled 2021-03-05: qty 1

## 2021-03-05 MED ORDER — TRAMADOL HCL 50 MG PO TABS: 50.0000 mg | ORAL_TABLET | Freq: Four times a day (QID) | ORAL | 0 refills | Status: AC | PRN

## 2021-03-05 MED ORDER — PROPOFOL 10 MG/ML IV BOLUS
INTRAVENOUS | Status: DC | PRN
  Administered 2021-03-05: 170 mg via INTRAVENOUS

## 2021-03-05 MED ORDER — DEXMEDETOMIDINE (PRECEDEX) IN NS 20 MCG/5ML (4 MCG/ML) IV SYRINGE
PREFILLED_SYRINGE | INTRAVENOUS | Status: AC
  Filled 2021-03-05: qty 5

## 2021-03-05 MED ORDER — LACTATED RINGERS IV SOLN: INTRAVENOUS | Status: DC | PRN

## 2021-03-05 MED ORDER — LIDOCAINE HCL URETHRAL/MUCOSAL 2 % EX GEL
CUTANEOUS | Status: DC | PRN
  Administered 2021-03-05: 1 via URETHRAL

## 2021-03-05 MED ORDER — CEFAZOLIN SODIUM-DEXTROSE 2-4 GM/100ML-% IV SOLN
INTRAVENOUS | Status: AC
  Filled 2021-03-05: qty 100

## 2021-03-05 MED ORDER — LIDOCAINE HCL (PF) 2 % IJ SOLN
INTRAMUSCULAR | Status: AC
  Filled 2021-03-05: qty 5

## 2021-03-05 MED ORDER — GLYCOPYRROLATE 0.2 MG/ML IJ SOLN
INTRAMUSCULAR | Status: DC | PRN
  Administered 2021-03-05: .2 mg via INTRAVENOUS

## 2021-03-05 MED ORDER — ONDANSETRON HCL 4 MG/2ML IJ SOLN
INTRAMUSCULAR | Status: DC | PRN
  Administered 2021-03-05: 4 mg via INTRAVENOUS

## 2021-03-05 MED ORDER — FENTANYL CITRATE (PF) 100 MCG/2ML IJ SOLN
INTRAMUSCULAR | Status: DC | PRN
  Administered 2021-03-05: 50 ug via INTRAVENOUS

## 2021-03-05 MED ORDER — WATER FOR IRRIGATION, STERILE IR SOLN
Status: DC | PRN
  Administered 2021-03-05: 500 mL

## 2021-03-05 MED ORDER — MIDAZOLAM HCL 2 MG/2ML IJ SOLN
INTRAMUSCULAR | Status: AC
  Filled 2021-03-05: qty 2

## 2021-03-05 MED ORDER — ONDANSETRON HCL 4 MG/2ML IJ SOLN: 4.0000 mg | Freq: Once | INTRAMUSCULAR | Status: DC | PRN

## 2021-03-05 MED ORDER — DEXMEDETOMIDINE (PRECEDEX) IN NS 20 MCG/5ML (4 MCG/ML) IV SYRINGE
PREFILLED_SYRINGE | INTRAVENOUS | Status: DC | PRN
  Administered 2021-03-05: 8 ug via INTRAVENOUS

## 2021-03-05 MED ORDER — MIDAZOLAM HCL 5 MG/5ML IJ SOLN
INTRAMUSCULAR | Status: DC | PRN
  Administered 2021-03-05: 2 mg via INTRAVENOUS

## 2021-03-05 MED ORDER — SODIUM CHLORIDE 0.9 % IR SOLN
Status: DC | PRN
  Administered 2021-03-05: 3000 mL

## 2021-03-05 MED ORDER — TRAMADOL HCL 50 MG PO TABS: 50.0000 mg | ORAL_TABLET | Freq: Once | ORAL | Status: AC

## 2021-03-05 MED ORDER — ONDANSETRON HCL 4 MG/2ML IJ SOLN
INTRAMUSCULAR | Status: AC
  Filled 2021-03-05: qty 2

## 2021-03-05 MED ORDER — EPHEDRINE 5 MG/ML INJ
INTRAVENOUS | Status: AC
  Filled 2021-03-05: qty 5

## 2021-03-05 MED ORDER — FENTANYL CITRATE (PF) 100 MCG/2ML IJ SOLN
INTRAMUSCULAR | Status: AC
  Filled 2021-03-05: qty 2

## 2021-03-05 MED ORDER — PROPOFOL 10 MG/ML IV BOLUS
INTRAVENOUS | Status: AC
  Filled 2021-03-05: qty 20

## 2021-03-05 MED ORDER — EPHEDRINE SULFATE-NACL 50-0.9 MG/10ML-% IV SOSY
PREFILLED_SYRINGE | INTRAVENOUS | Status: DC | PRN
  Administered 2021-03-05 (×2): 5 mg via INTRAVENOUS

## 2021-03-05 MED ORDER — CEFAZOLIN SODIUM-DEXTROSE 2-4 GM/100ML-% IV SOLN
2.0000 g | INTRAVENOUS | Status: AC
  Administered 2021-03-05: 2 g via INTRAVENOUS

## 2021-03-05 MED ORDER — FENTANYL CITRATE PF 50 MCG/ML IJ SOSY: 25.0000 ug | PREFILLED_SYRINGE | INTRAMUSCULAR | Status: DC | PRN

## 2021-03-05 SURGICAL SUPPLY — 18 items
BAG DRAIN URO TABLE W/ADPT NS (BAG) ×2 IMPLANT
BAG DRN 8 ADPR NS SKTRN CSTL (BAG) ×1
BAG HAMPER (MISCELLANEOUS) ×2 IMPLANT
CLOTH BEACON ORANGE TIMEOUT ST (SAFETY) ×2 IMPLANT
GLOVE SURG POLYISO LF SZ8 (GLOVE) ×2 IMPLANT
GLOVE SURG UNDER POLY LF SZ7 (GLOVE) ×4 IMPLANT
GOWN STRL REUS W/TWL LRG LVL3 (GOWN DISPOSABLE) ×2 IMPLANT
GOWN STRL REUS W/TWL XL LVL3 (GOWN DISPOSABLE) ×2 IMPLANT
KIT TURNOVER CYSTO (KITS) ×2 IMPLANT
MANIFOLD NEPTUNE II (INSTRUMENTS) ×2 IMPLANT
PACK CYSTO (CUSTOM PROCEDURE TRAY) ×2 IMPLANT
PAD ARMBOARD 7.5X6 YLW CONV (MISCELLANEOUS) ×2 IMPLANT
SYSTEM UROLIFT (Male Continence) ×12 IMPLANT
TOWEL OR 17X26 4PK STRL BLUE (TOWEL DISPOSABLE) ×2 IMPLANT
TRAY FOLEY W/BAG SLVR 16FR (SET/KITS/TRAYS/PACK) ×2
TRAY FOLEY W/BAG SLVR 16FR ST (SET/KITS/TRAYS/PACK) ×1 IMPLANT
WATER STERILE IRR 3000ML UROMA (IV SOLUTION) ×2 IMPLANT
WATER STERILE IRR 500ML POUR (IV SOLUTION) ×2 IMPLANT

## 2021-03-05 NOTE — Anesthesia Preprocedure Evaluation (Signed)
Anesthesia Evaluation  Patient identified by MRN, date of birth, ID band Patient awake    Reviewed: Allergy & Precautions, H&P , NPO status , Patient's Chart, lab work & pertinent test results, reviewed documented beta blocker date and time   Airway Mallampati: II  TM Distance: >3 FB Neck ROM: full    Dental no notable dental hx.    Pulmonary sleep apnea , pneumonia, resolved, former smoker,    Pulmonary exam normal breath sounds clear to auscultation       Cardiovascular Exercise Tolerance: Good hypertension, negative cardio ROS   Rhythm:regular Rate:Normal     Neuro/Psych Seizures -, Well Controlled,  PSYCHIATRIC DISORDERS Anxiety Depression  Neuromuscular disease    GI/Hepatic negative GI ROS, Neg liver ROS,   Endo/Other  negative endocrine ROS  Renal/GU CRFRenal disease  negative genitourinary   Musculoskeletal   Abdominal   Peds  Hematology  (+) Blood dyscrasia, anemia ,   Anesthesia Other Findings   Reproductive/Obstetrics negative OB ROS                             Anesthesia Physical Anesthesia Plan  ASA: 3  Anesthesia Plan: General   Post-op Pain Management:    Induction:   PONV Risk Score and Plan: Ondansetron  Airway Management Planned:   Additional Equipment:   Intra-op Plan:   Post-operative Plan:   Informed Consent: I have reviewed the patients History and Physical, chart, labs and discussed the procedure including the risks, benefits and alternatives for the proposed anesthesia with the patient or authorized representative who has indicated his/her understanding and acceptance.     Dental Advisory Given  Plan Discussed with: CRNA  Anesthesia Plan Comments:         Anesthesia Quick Evaluation

## 2021-03-05 NOTE — Transfer of Care (Signed)
Immediate Anesthesia Transfer of Care Note  Patient: Tony Patterson  Procedure(s) Performed: CYSTOSCOPY WITH INSERTION OF UROLIFT (Prostate)  Patient Location: PACU  Anesthesia Type:General  Level of Consciousness: drowsy and patient cooperative  Airway & Oxygen Therapy: Patient Spontanous Breathing  Post-op Assessment: Report given to RN and Post -op Vital signs reviewed and stable  Post vital signs: Reviewed and stable  Last Vitals:  Vitals Value Taken Time  BP 114/74 03/05/21 1125  Temp    Pulse 79 03/05/21 1127  Resp 14 03/05/21 1127  SpO2 96 % 03/05/21 1127  Vitals shown include unvalidated device data.  Last Pain:  Vitals:   03/05/21 0956  TempSrc: Oral  PainSc: 0-No pain      Patients Stated Pain Goal: 7 (03/05/21 0956)  Complications: No notable events documented.

## 2021-03-05 NOTE — H&P (Signed)
Urology Admission H&P  Chief Complaint: difficulty urinating  History of Present Illness: [name] is a 65yo withy a history of BPH who has failed medical therapy and presents today for Urolift placement. No new complaints today  Past Medical History:  Diagnosis Date   Alcohol abuse    Anemia    Anxiety    Hypertension    Neuropathy    Neuropathy    Seizures (HCC)    unknown etiology and on no meds; been 4 years since seizure.   Sleep apnea    Past Surgical History:  Procedure Laterality Date   COLONOSCOPY  05/18/2007   AST:[id number] hemorrhoids, a single anal papilla, otherwise normal/ Single polyp, as described above, removed    COLONOSCOPY WITH PROPOFOL N/A 03/16/2015   Procedure: COLONOSCOPY WITH PROPOFOL;  Surgeon: Corbin Ade, MD;  Location: [facility];  Service: Endoscopy;  Laterality: N/A;  in cecum at 1344. withdrawal time    THROAT SURGERY     polyps removed x2    Home Medications:  Current Facility-Administered Medications  Medication Dose Route Frequency Provider Last Rate Last Admin   ceFAZolin (ANCEF) 2-4 GM/100ML-% IVPB            ceFAZolin (ANCEF) IVPB 2g/100 mL premix  2 g Intravenous 30 min Pre-Op Kalesha Irving, Mardene Celeste, MD       Allergies:  Allergies  Allergen Reactions   Bee Venom Anaphylaxis   Penicillins Other (See Comments)    Childhood allergic reaction - no other information available    Family History  Problem Relation Age of Onset   Fibromyalgia Mother    Dementia Mother    Congestive Heart Failure Mother    Congestive Heart Failure Father    Coronary artery disease Father 54       CABG 3 different times   Hypertension Brother    CAD Brother 65   Hypertension Brother    CAD Brother 61   Neuropathy Neg Hx    Colon cancer Neg Hx    Social History:  reports that he quit smoking about 25 years ago. His smoking use included cigarettes. He has a 10.00 pack-year smoking history. He has never used smokeless tobacco. He reports that he does  not currently use alcohol. He reports that he does not use drugs.  Review of Systems  Genitourinary:  Positive for difficulty urinating.  All other systems reviewed and are negative.  Physical Exam:  Vital signs in last 24 hours: Temp:  [98.2 F (36.8 C)] 98.2 F (36.8 C) (10/03 0956) Resp:  [14] 14 (10/03 0956) BP: (145)/(78) 145/78 (10/03 0956) SpO2:  [100 %] 100 % (10/03 0956) Physical Exam Vitals reviewed.  Constitutional:      Appearance: Normal appearance.  HENT:     Head: Normocephalic and atraumatic.     Nose: Nose normal.  Eyes:     Extraocular Movements: Extraocular movements intact.     Pupils: Pupils are equal, round, and reactive to light.  Cardiovascular:     Rate and Rhythm: Normal rate and regular rhythm.  Pulmonary:     Effort: Pulmonary effort is normal. No respiratory distress.  Abdominal:     General: Abdomen is flat. There is no distension.  Musculoskeletal:        General: No swelling. Normal range of motion.     Cervical back: Normal range of motion and neck supple.  Skin:    General: Skin is warm and dry.  Neurological:     General:  No focal deficit present.     Mental Status: He is alert and oriented to person, place, and time.  Psychiatric:        Mood and Affect: Mood normal.        Behavior: Behavior normal.        Thought Content: Thought content normal.        Judgment: Judgment normal.    Laboratory Data:  No results found for this or any previous visit (from the past 24 hour(s)). No results found for this or any previous visit (from the past 240 hour(s)). Creatinine: Recent Labs    03/01/21 1453  CREATININE 1.82*   Baseline Creatinine: 1.8  Impression/Assessment:  65yo with BPH with urinary retention  Plan:  We discussed the management of his BPH including continued medical therapy, Rezum, Urolift, TURP and simple prostatectomy. After discussing the options the patient has elected to proceed with Urolift.  Risks/benefits/alternatives discussed.   [name] 03/05/2021, 10:34 AM

## 2021-03-05 NOTE — Op Note (Signed)
PREOPERATIVE DIAGNOSIS:  Benign prostatic hypertrophy with bladder outlet obstruction.   POSTOPERATIVE DIAGNOSIS:  Benign prostatic hypertrophy with bladder outlet obstruction.   PROCEDURE:  Cystoscopy with implantation of UroLift devices, 6 implants.   SURGEON:  Nicolette Bang, M.D.   ANESTHESIA:  MAC   ANTIBIOTICS: ancef   SPECIMEN:  None.   DRAINS:  A 16-French Foley catheter.   BLOOD LOSS:  Minimal.   COMPLICATIONS:  None.   INDICATIONS: The Patient is an 65 year old male with BPH and bladder outlet obstruction.  He has failed medical therapy and has elected UroLift for definitive treatment.   FINDINGS OF PROCEDURE:  He was taken to the operating room where a general anesthetic was induced.  He was placed in lithotomy position and was fitted with PAS hose.  His perineum and genitalia were prepped with chlorhexidine, and he was draped in usual sterile fashion.   Cystoscopy was performed using the UroLift scope and 0 degree lens. Examination revealed a normal urethra.  The external sphincter was intact.  Prostatic urethra was approximately 4 cm in length with lateral lobe enlargement. There was also little bit of bladder neck elevation. Inspection of bladder revealed mild-to-moderate trabeculation with no tumors, stones, or inflammation.  No cellules or diverticula were noted. Ureteral orifices were in their normal anatomic position effluxing clear urine.   After initial cystoscopy, the visual obturator was replaced with the first UroLift device.  This was turned to the 9 o'clock position and pulled back to the veru and then slightly advanced.  Pressure was then applied to the right lateral lobe and the UroLift device was deployed.   The second UroLift device was then inserted and applied to the left lateral lobe at 3 o'clock and deployed in the mid prostatic urethra. After this, there was still some apparent obstruction closer to the bladder neck.  So a second  and third level of UroLift your left device was applied between the mid urethra and the proximal urethra providing further patency to the prostatic urethra.   The scope was removed and a 16-French Foley catheter was inserted without difficulty. The balloon was filled with 10 mL sterile fluid, and the catheter was placed to straight drainage.   COMPLICATIONS: None    CONDITION: Stable, extubated, transferred to PACU   PLAN: The patient will be discharged home and followup in 2 days for a voiding trial.

## 2021-03-05 NOTE — Anesthesia Postprocedure Evaluation (Signed)
Anesthesia Post Note  Patient: Tony Patterson  Procedure(s) Performed: CYSTOSCOPY WITH INSERTION OF UROLIFT (Prostate)  Patient location during evaluation: Phase II Anesthesia Type: General Level of consciousness: awake Pain management: pain level controlled Vital Signs Assessment: post-procedure vital signs reviewed and stable Respiratory status: spontaneous breathing and respiratory function stable Cardiovascular status: blood pressure returned to baseline and stable Postop Assessment: no headache and no apparent nausea or vomiting Anesthetic complications: no Comments: Late entry   No notable events documented.   Last Vitals:  Vitals:   03/05/21 1202 03/05/21 1209  BP: (!) 137/95   Pulse:    Resp:    Temp:  36.7 C  SpO2:      Last Pain:  Vitals:   03/05/21 1202  TempSrc: Oral  PainSc:                  Windell Norfolk

## 2021-03-05 NOTE — Anesthesia Procedure Notes (Signed)
Procedure Name: LMA Insertion Date/Time: 03/05/2021 10:55 AM Performed by: Marny Lowenstein, CRNA Pre-anesthesia Checklist: Patient identified, Emergency Drugs available, Suction available and Patient being monitored Patient Re-evaluated:Patient Re-evaluated prior to induction Oxygen Delivery Method: Circle system utilized Preoxygenation: Pre-oxygenation with 100% oxygen Induction Type: IV induction LMA: LMA inserted LMA Size: 5.0 Number of attempts: 1 Placement Confirmation: positive ETCO2 and breath sounds checked- equal and bilateral Tube secured with: Tape Dental Injury: Teeth and Oropharynx as per pre-operative assessment

## 2021-03-06 ENCOUNTER — Encounter (HOSPITAL_COMMUNITY): Payer: Self-pay | Admitting: Urology

## 2021-03-07 ENCOUNTER — Encounter: Payer: Self-pay | Admitting: Urology

## 2021-03-07 ENCOUNTER — Ambulatory Visit: Payer: BC Managed Care – PPO | Admitting: Urology

## 2021-03-07 ENCOUNTER — Other Ambulatory Visit: Payer: Self-pay

## 2021-03-07 VITALS — BP 124/79 | HR 77 | Wt 254.0 lb

## 2021-03-07 DIAGNOSIS — N138 Other obstructive and reflux uropathy: Secondary | ICD-10-CM

## 2021-03-07 DIAGNOSIS — N401 Enlarged prostate with lower urinary tract symptoms: Secondary | ICD-10-CM | POA: Diagnosis not present

## 2021-03-07 NOTE — Progress Notes (Signed)
Fill and Pull Catheter Removal  Patient is present today for a catheter removal.  Patient was cleaned and prepped in a sterile fashion 103ml of sterile water/ saline was instilled into the bladder when the patient felt the urge to urinate. 66ml of water was then drained from the balloon.  A 16FR foley cath was removed from the bladder no complications were noted .  Patient as then given some time to void on their own.  Patient can void  29ml on their own after some time.  Patient tolerated well.  Performed by: Marchelle Folks RN  Follow up/ Additional notes: MD to see    Patient returned this afternoon for PVR- 224 Reviewed with Dr. Ronne Binning. Patient will continue his Flomax and return in 1 week for [place] PVR. Patient voiced understanding.

## 2021-03-14 ENCOUNTER — Ambulatory Visit: Payer: BC Managed Care – PPO

## 2021-03-14 ENCOUNTER — Other Ambulatory Visit: Payer: Self-pay

## 2021-03-14 DIAGNOSIS — N138 Other obstructive and reflux uropathy: Secondary | ICD-10-CM

## 2021-03-14 DIAGNOSIS — N401 Enlarged prostate with lower urinary tract symptoms: Secondary | ICD-10-CM

## 2021-03-14 NOTE — Progress Notes (Signed)
Patient here for PVR today 117  Patient will return in 6-8 weeks for MD with PVR.

## 2021-03-20 ENCOUNTER — Encounter: Payer: Self-pay | Admitting: Urology

## 2021-03-20 NOTE — Progress Notes (Signed)
03/07/2021 8:27 AM   [name] 1955/06/28 [id number]  Referring provider: Carylon Perches, MD [address] [place],  [place] [address]  Followup BPH   HPI: Mr Crickenberger is a 65yo here for followup for BPH. He underwent urolift 2 days ago. Voiding trial passed today. No pelvic pain. NO hematuria or dysuria. No other complaints today   PMH: Past Medical History:  Diagnosis Date   Alcohol abuse    Anemia    Anxiety    Hypertension    Neuropathy    Neuropathy    Seizures (HCC)    unknown etiology and on no meds; been 4 years since seizure.   Sleep apnea     Surgical History: Past Surgical History:  Procedure Laterality Date   COLONOSCOPY  05/18/2007   [id number] hemorrhoids, a single anal papilla, otherwise normal/ Single polyp, as described above, removed    COLONOSCOPY WITH PROPOFOL N/A 03/16/2015   Procedure: COLONOSCOPY WITH PROPOFOL;  Surgeon: Corbin Ade, MD;  Location: [facility];  Service: Endoscopy;  Laterality: N/A;  in cecum at 1344. withdrawal time    CYSTOSCOPY WITH INSERTION OF UROLIFT N/A 03/05/2021   Procedure: CYSTOSCOPY WITH INSERTION OF UROLIFT;  Surgeon: Malen Gauze, MD;  Location: [facility];  Service: Urology;  Laterality: N/A;   THROAT SURGERY     polyps removed x2    Home Medications:  Allergies as of 03/07/2021       Reactions   Bee Venom Anaphylaxis   Penicillins Other (See Comments)   Childhood allergic reaction - no other information available        Medication List        Accurate as of March 07, 2021 11:59 PM. If you have any questions, ask your nurse or doctor.          acetaminophen 500 MG tablet Commonly known as: TYLENOL Take 1,000 mg by mouth every 6 (six) hours as needed for moderate pain.   aspirin EC 81 MG tablet Take 81 mg by mouth daily.   atorvastatin 80 MG tablet Commonly known as: LIPITOR Take 40 mg by mouth 2 (two) times daily.   CLEAR EYES OP Place 1 drop into both eyes daily.    EPINEPHrine 0.3 mg/0.3 mL Soaj injection Commonly known as: EPI-PEN Inject 0.3 mg into the muscle as needed for anaphylaxis.   gabapentin 600 MG tablet Commonly known as: NEURONTIN TAKE TWO TABLETS (1200MG ) BY MOUTH THREETIMES A DAY AS NEEDED What changed: See the new instructions.   LORazepam 0.5 MG tablet Commonly known as: ATIVAN Take 0.5 mg by mouth daily as needed for anxiety.   magnesium oxide 400 MG tablet Commonly known as: MAG-OX Take 400 mg by mouth daily.   metoprolol tartrate 50 MG tablet Commonly known as: LOPRESSOR Take 50 mg by mouth 2 (two) times daily.   multivitamin with minerals Tabs tablet Take 1 tablet by mouth daily.   omeprazole 20 MG capsule Commonly known as: PRILOSEC Take 20 mg by mouth daily.   sildenafil 100 MG tablet Commonly known as: VIAGRA Take 100 mg by mouth as needed for erectile dysfunction.   tamsulosin 0.4 MG Caps capsule Commonly known as: FLOMAX TAKE 1 CAPSULE BY MOUTH IN THE MORNING AND 1 AT BEDTIME   traMADol 50 MG tablet Commonly known as: Ultram Take 1 tablet (50 mg total) by mouth every 6 (six) hours as needed.   Vitamin D 50 MCG (2000 UT) tablet Take 2,000 Units by mouth daily.  Allergies:  Allergies  Allergen Reactions   Bee Venom Anaphylaxis   Penicillins Other (See Comments)    Childhood allergic reaction - no other information available    Family History: Family History  Problem Relation Age of Onset   Fibromyalgia Mother    Dementia Mother    Congestive Heart Failure Mother    Congestive Heart Failure Father    Coronary artery disease Father 78       CABG 3 different times   Hypertension Brother    CAD Brother 80   Hypertension Brother    CAD Brother 64   Neuropathy Neg Hx    Colon cancer Neg Hx     Social History:  reports that he quit smoking about 25 years ago. His smoking use included cigarettes. He has a 10.00 pack-year smoking history. He has never used smokeless tobacco. He  reports that he does not currently use alcohol. He reports that he does not use drugs.  ROS: All other review of systems were reviewed and are negative except what is noted above in HPI  Physical Exam: BP 124/79   Pulse 77   Wt 254 lb (115.2 kg)   BMI 31.75 kg/m   Constitutional:  Alert and oriented, No acute distress. HEENT: [place] AT, moist mucus membranes.  Trachea midline, no masses. Cardiovascular: No clubbing, cyanosis, or edema. Respiratory: Normal respiratory effort, no increased work of breathing. GI: Abdomen is soft, nontender, nondistended, no abdominal masses GU: No CVA tenderness.  Lymph: No cervical or inguinal lymphadenopathy. Skin: No rashes, bruises or suspicious lesions. Neurologic: Grossly intact, no focal deficits, moving all 4 extremities. Psychiatric: Normal mood and affect.  Laboratory Data: Lab Results  Component Value Date   WBC 6.6 03/01/2021   HGB 13.5 03/01/2021   HCT 42.3 03/01/2021   MCV 92.6 03/01/2021   PLT 224 03/01/2021    Lab Results  Component Value Date   CREATININE 1.82 (H) 03/01/2021    No results found for: PSA  No results found for: TESTOSTERONE  Lab Results  Component Value Date   HGBA1C 5.6 08/24/2015    Urinalysis    Component Value Date/Time   COLORURINE YELLOW 04/12/2016 1046   APPEARANCEUR Clear 01/24/2021 0839   LABSPEC 1.025 04/12/2016 1046   PHURINE 6.5 04/12/2016 1046   GLUCOSEU Negative 01/24/2021 0839   HGBUR LARGE (A) 04/12/2016 1046   BILIRUBINUR Negative 01/24/2021 0839   KETONESUR 15 (A) 04/12/2016 1046   PROTEINUR 2+ (A) 01/24/2021 0839   PROTEINUR >300 (A) 04/12/2016 1046   UROBILINOGEN 0.2 05/06/2013 1015   NITRITE Negative 01/24/2021 0839   NITRITE NEGATIVE 04/12/2016 1046   LEUKOCYTESUR Negative 01/24/2021 0839    Lab Results  Component Value Date   LABMICR See below: 01/24/2021   WBCUA None seen 01/24/2021   LABEPIT 0-10 01/24/2021   MUCUS Present 01/24/2021   BACTERIA None seen  01/24/2021    Pertinent Imaging:  No results found for this or any previous visit.  No results found for this or any previous visit.  No results found for this or any previous visit.  No results found for this or any previous visit.  No results found for this or any previous visit.  No results found for this or any previous visit.  No results found for this or any previous visit.  No results found for this or any previous visit.   Assessment & Plan:    1. Benign prostatic hyperplasia with urinary obstruction -RTC 1 week with PVR  No follow-ups on file.  Nicolette Bang, MD  [facility] [place]

## 2021-03-20 NOTE — Patient Instructions (Signed)
Benign Prostatic Hyperplasia Benign prostatic hyperplasia (BPH) is an enlarged prostate gland that is caused by the normal aging process and not by cancer. The prostate is a walnut-sized gland that is involved in the production of semen. It is located in front of the rectum and below the bladder. The bladder stores urine and the urethra is the tube that carries the urine out of the body. The prostate may get bigger as a man gets older. An enlarged prostate can press on the urethra. This can make it harder to pass urine. The build-up of urine in the bladder can cause infection. Back pressure and infection may progress to bladder damage and kidney (renal) failure. What are the causes? This condition is part of a normal aging process. However, not all men develop problems from this condition. If the prostate enlarges away from the urethra, urine flow will not be blocked. If it enlarges toward the urethra and compresses it, there will be problems passing urine. What increases the risk? This condition is more likely to develop in men over the age of 50 years. What are the signs or symptoms? Symptoms of this condition include: Getting up often during the night to urinate. Needing to urinate frequently during the day. Difficulty starting urine flow. Decrease in size and strength of your urine stream. Leaking (dribbling) after urinating. Inability to pass urine. This needs immediate treatment. Inability to completely empty your bladder. Pain when you pass urine. This is more common if there is also an infection. Urinary tract infection (UTI). How is this diagnosed? This condition is diagnosed based on your medical history, a physical exam, and your symptoms. Tests will also be done, such as: A post-void bladder scan. This measures any amount of urine that may remain in your bladder after you finish urinating. A digital rectal exam. In a rectal exam, your health care provider checks your prostate by  putting a lubricated, gloved finger into your rectum to feel the back of your prostate gland. This exam detects the size of your gland and any abnormal lumps or growths. An exam of your urine (urinalysis). A prostate specific antigen (PSA) screening. This is a blood test used to screen for prostate cancer. An ultrasound. This test uses sound waves to electronically produce a picture of your prostate gland. Your health care provider may refer you to a specialist in kidney and prostate diseases (urologist). How is this treated? Once symptoms begin, your health care provider will monitor your condition (active surveillance or watchful waiting). Treatment for this condition will depend on the severity of your condition. Treatment may include: Observation and yearly exams. This may be the only treatment needed if your condition and symptoms are mild. Medicines to relieve your symptoms, including: Medicines to shrink the prostate. Medicines to relax the muscle of the prostate. Surgery in severe cases. Surgery may include: Prostatectomy. In this procedure, the prostate tissue is removed completely through an open incision or with a laparoscope or robotics. Transurethral resection of the prostate (TURP). In this procedure, a tool is inserted through the opening at the tip of the penis (urethra). It is used to cut away tissue of the inner core of the prostate. The pieces are removed through the same opening of the penis. This removes the blockage. Transurethral incision (TUIP). In this procedure, small cuts are made in the prostate. This lessens the prostate's pressure on the urethra. Transurethral microwave thermotherapy (TUMT). This procedure uses microwaves to create heat. The heat destroys and removes a   small amount of prostate tissue. Transurethral needle ablation (TUNA). This procedure uses radio frequencies to destroy and remove a small amount of prostate tissue. Interstitial laser coagulation (ILC).  This procedure uses a laser to destroy and remove a small amount of prostate tissue. Transurethral electrovaporization (TUVP). This procedure uses electrodes to destroy and remove a small amount of prostate tissue. Prostatic urethral lift. This procedure inserts an implant to push the lobes of the prostate away from the urethra. Follow these instructions at home: Take over-the-counter and prescription medicines only as told by your health care provider. Monitor your symptoms for any changes. Contact your health care provider with any changes. Avoid drinking large amounts of liquid before going to bed or out in public. Avoid or reduce how much caffeine or alcohol you drink. Give yourself time when you urinate. Keep all follow-up visits as told by your health care provider. This is important. Contact a health care provider if: You have unexplained back pain. Your symptoms do not get better with treatment. You develop side effects from the medicine you are taking. Your urine becomes very dark or has a bad smell. Your lower abdomen becomes distended and you have trouble passing your urine. Get help right away if: You have a fever or chills. You suddenly cannot urinate. You feel lightheaded, or very dizzy, or you faint. There are large amounts of blood or clots in the urine. Your urinary problems become hard to manage. You develop moderate to severe low back or flank pain. The flank is the side of your body between the ribs and the hip. These symptoms may represent a serious problem that is an emergency. Do not wait to see if the symptoms will go away. Get medical help right away. Call your local emergency services (911 in the [place] Do not drive yourself to the hospital. Summary Benign prostatic hyperplasia (BPH) is an enlarged prostate that is caused by the normal aging process and not by cancer. An enlarged prostate can press on the urethra. This can make it hard to pass urine. This  condition is part of a normal aging process and is more likely to develop in men over the age of 50 years. Get help right away if you suddenly cannot urinate. This information is not intended to replace advice given to you by your health care provider. Make sure you discuss any questions you have with your health care provider. Document Revised: 08/30/2020 Document Reviewed: 01/27/2020 Elsevier Patient Education  2022 [employer] Inc[employer]  

## 2021-05-02 ENCOUNTER — Ambulatory Visit (INDEPENDENT_AMBULATORY_CARE_PROVIDER_SITE_OTHER): Payer: BC Managed Care – PPO | Admitting: Urology

## 2021-05-02 ENCOUNTER — Encounter: Payer: Self-pay | Admitting: Urology

## 2021-05-02 ENCOUNTER — Other Ambulatory Visit: Payer: Self-pay

## 2021-05-02 VITALS — BP 131/83 | HR 71 | Ht 75.0 in | Wt 254.0 lb

## 2021-05-02 DIAGNOSIS — R351 Nocturia: Secondary | ICD-10-CM | POA: Diagnosis not present

## 2021-05-02 DIAGNOSIS — R3912 Poor urinary stream: Secondary | ICD-10-CM | POA: Diagnosis not present

## 2021-05-02 DIAGNOSIS — N138 Other obstructive and reflux uropathy: Secondary | ICD-10-CM

## 2021-05-02 DIAGNOSIS — N401 Enlarged prostate with lower urinary tract symptoms: Secondary | ICD-10-CM | POA: Diagnosis not present

## 2021-05-02 LAB — BLADDER SCAN AMB NON-IMAGING: PVR: 5 WU

## 2021-05-02 LAB — URINALYSIS, ROUTINE W REFLEX MICROSCOPIC
Bilirubin, UA: NEGATIVE
Glucose, UA: NEGATIVE
Ketones, UA: NEGATIVE
Leukocytes,UA: NEGATIVE
Nitrite, UA: NEGATIVE
RBC, UA: NEGATIVE
Specific Gravity, UA: 1.015 (ref 1.005–1.030)
Urobilinogen, Ur: 0.2 mg/dL (ref 0.2–1.0)
pH, UA: 5.5 (ref 5.0–7.5)

## 2021-05-02 NOTE — Patient Instructions (Signed)

## 2021-05-02 NOTE — Progress Notes (Signed)
05/02/2021 9:25 AM   Tony Patterson 1956-04-04 [id number]  Referring provider: Carylon Perches, MD [address] [place],  [place] [address]  Followup BPH   HPI: Tony Patterson is a 65yo here for followup for BPH and nocturia. He underwent Urolift 8 weeks ago. IPSS 10 QOL1. Urine stream strong. Nocturia decreased to 3x. No straining to urinate. No other complaints today.    PMH: Past Medical History:  Diagnosis Date   Alcohol abuse    Anemia    Anxiety    Hypertension    Neuropathy    Neuropathy    Seizures (HCC)    unknown etiology and on no meds; been 4 years since seizure.   Sleep apnea     Surgical History: Past Surgical History:  Procedure Laterality Date   COLONOSCOPY  05/18/2007   EVO:[id number] hemorrhoids, a single anal papilla, otherwise normal/ Single polyp, as described above, removed    COLONOSCOPY WITH PROPOFOL N/A 03/16/2015   Procedure: COLONOSCOPY WITH PROPOFOL;  Surgeon: Corbin Ade, MD;  Location: [facility];  Service: Endoscopy;  Laterality: N/A;  in cecum at 1344. withdrawal time    CYSTOSCOPY WITH INSERTION OF UROLIFT N/A 03/05/2021   Procedure: CYSTOSCOPY WITH INSERTION OF UROLIFT;  Surgeon: Malen Gauze, MD;  Location: [facility];  Service: Urology;  Laterality: N/A;   THROAT SURGERY     polyps removed x2    Home Medications:  Allergies as of 05/02/2021       Reactions   Bee Venom Anaphylaxis   Penicillins Other (See Comments)   Childhood allergic reaction - no other information available        Medication List        Accurate as of May 02, 2021  9:25 AM. If you have any questions, ask your nurse or doctor.          acetaminophen 500 MG tablet Commonly known as: TYLENOL Take 1,000 mg by mouth every 6 (six) hours as needed for moderate pain.   aspirin EC 81 MG tablet Take 81 mg by mouth daily.   atorvastatin 80 MG tablet Commonly known as: LIPITOR Take 40 mg by mouth 2 (two) times daily.   CLEAR EYES OP Place 1  drop into both eyes daily.   EPINEPHrine 0.3 mg/0.3 mL Soaj injection Commonly known as: EPI-PEN Inject 0.3 mg into the muscle as needed for anaphylaxis.   gabapentin 600 MG tablet Commonly known as: NEURONTIN TAKE TWO TABLETS (1200MG ) BY MOUTH THREETIMES A DAY AS NEEDED What changed: See the new instructions.   LORazepam 0.5 MG tablet Commonly known as: ATIVAN Take 0.5 mg by mouth daily as needed for anxiety.   magnesium oxide 400 MG tablet Commonly known as: MAG-OX Take 400 mg by mouth daily.   metoprolol tartrate 50 MG tablet Commonly known as: LOPRESSOR Take 50 mg by mouth 2 (two) times daily.   multivitamin with minerals Tabs tablet Take 1 tablet by mouth daily.   omeprazole 20 MG capsule Commonly known as: PRILOSEC Take 20 mg by mouth daily.   sildenafil 100 MG tablet Commonly known as: VIAGRA Take 100 mg by mouth as needed for erectile dysfunction.   tamsulosin 0.4 MG Caps capsule Commonly known as: FLOMAX TAKE 1 CAPSULE BY MOUTH IN THE MORNING AND 1 AT BEDTIME   traMADol 50 MG tablet Commonly known as: Ultram Take 1 tablet (50 mg total) by mouth every 6 (six) hours as needed.   Vitamin D 50 MCG (2000 UT) tablet  Take 2,000 Units by mouth daily.        Allergies:  Allergies  Allergen Reactions   Bee Venom Anaphylaxis   Penicillins Other (See Comments)    Childhood allergic reaction - no other information available    Family History: Family History  Problem Relation Age of Onset   Fibromyalgia Mother    Dementia Mother    Congestive Heart Failure Mother    Congestive Heart Failure Father    Coronary artery disease Father 28       CABG 3 different times   Hypertension Brother    CAD Brother 3   Hypertension Brother    CAD Brother 85   Neuropathy Neg Hx    Colon cancer Neg Hx     Social History:  reports that he quit smoking about 25 years ago. His smoking use included cigarettes. He has a 10.00 pack-year smoking history. He has never used  smokeless tobacco. He reports that he does not currently use alcohol. He reports that he does not use drugs.  ROS: All other review of systems were reviewed and are negative except what is noted above in HPI  Physical Exam: BP 131/83   Pulse 71   Ht 6\' 3"  (1.905 m)   Wt 254 lb (115.2 kg)   BMI 31.75 kg/m   Constitutional:  Alert and oriented, No acute distress. HEENT: [place] AT, moist mucus membranes.  Trachea midline, no masses. Cardiovascular: No clubbing, cyanosis, or edema. Respiratory: Normal respiratory effort, no increased work of breathing. GI: Abdomen is soft, nontender, nondistended, no abdominal masses GU: No CVA tenderness.  Lymph: No cervical or inguinal lymphadenopathy. Skin: No rashes, bruises or suspicious lesions. Neurologic: Grossly intact, no focal deficits, moving all 4 extremities. Psychiatric: Normal mood and affect.  Laboratory Data: Lab Results  Component Value Date   WBC 6.6 03/01/2021   HGB 13.5 03/01/2021   HCT 42.3 03/01/2021   MCV 92.6 03/01/2021   PLT 224 03/01/2021    Lab Results  Component Value Date   CREATININE 1.82 (H) 03/01/2021    No results found for: PSA  No results found for: TESTOSTERONE  Lab Results  Component Value Date   HGBA1C 5.6 08/24/2015    Urinalysis    Component Value Date/Time   COLORURINE YELLOW 04/12/2016 1046   APPEARANCEUR Clear 01/24/2021 0839   LABSPEC 1.025 04/12/2016 1046   PHURINE 6.5 04/12/2016 1046   GLUCOSEU Negative 01/24/2021 0839   HGBUR LARGE (A) 04/12/2016 1046   BILIRUBINUR Negative 01/24/2021 0839   KETONESUR 15 (A) 04/12/2016 1046   PROTEINUR 2+ (A) 01/24/2021 0839   PROTEINUR >300 (A) 04/12/2016 1046   UROBILINOGEN 0.2 05/06/2013 1015   NITRITE Negative 01/24/2021 0839   NITRITE NEGATIVE 04/12/2016 1046   LEUKOCYTESUR Negative 01/24/2021 0839    Lab Results  Component Value Date   LABMICR See below: 01/24/2021   WBCUA None seen 01/24/2021   LABEPIT 0-10 01/24/2021   MUCUS  Present 01/24/2021   BACTERIA None seen 01/24/2021    Pertinent Imaging:  No results found for this or any previous visit.  No results found for this or any previous visit.  No results found for this or any previous visit.  No results found for this or any previous visit.  No results found for this or any previous visit.  No results found for this or any previous visit.  No results found for this or any previous visit.  No results found for this or any previous visit.  Assessment & Plan:    1. Benign prostatic hyperplasia with urinary obstruction -RTC 6 months with PVR - Urinalysis, Routine w reflex microscopic - BLADDER SCAN AMB NON-IMAGING  2. Nocturia -Wean flomax  3. Weak urinary stream -wean flomax   No follow-ups on file.  [name], MD  [facility] [place]

## 2021-05-02 NOTE — Progress Notes (Signed)
post void residual   5 ml

## 2021-05-02 NOTE — Progress Notes (Signed)
Urological Symptom Review  Patient is experiencing the following symptoms: Frequent urination Get up at night to urinate Leakage of urine   Review of Systems  Gastrointestinal (upper)  : Negative for upper GI symptoms  Gastrointestinal (lower) : Diarrhea  Constitutional : Negative for symptoms  Skin: Negative for skin symptoms  Eyes: Negative for eye symptoms  Ear/Nose/Throat : Sinus problems  Hematologic/Lymphatic: Easy bruising  Cardiovascular : Negative for cardiovascular symptoms  Respiratory : Negative for respiratory symptoms  Endocrine: Negative for endocrine symptoms  Musculoskeletal: Negative for musculoskeletal symptoms  Neurological: Negative for neurological symptoms  Psychologic: Anxiety

## 2021-10-31 ENCOUNTER — Encounter: Payer: Self-pay | Admitting: Urology

## 2021-10-31 ENCOUNTER — Ambulatory Visit: Payer: Medicare Other | Admitting: Urology

## 2021-10-31 VITALS — BP 151/88 | HR 60

## 2021-10-31 DIAGNOSIS — R351 Nocturia: Secondary | ICD-10-CM | POA: Diagnosis not present

## 2021-10-31 DIAGNOSIS — N138 Other obstructive and reflux uropathy: Secondary | ICD-10-CM | POA: Diagnosis not present

## 2021-10-31 DIAGNOSIS — N401 Enlarged prostate with lower urinary tract symptoms: Secondary | ICD-10-CM

## 2021-10-31 LAB — URINALYSIS, ROUTINE W REFLEX MICROSCOPIC
Bilirubin, UA: NEGATIVE
Glucose, UA: NEGATIVE
Ketones, UA: NEGATIVE
Leukocytes,UA: NEGATIVE
Nitrite, UA: NEGATIVE
RBC, UA: NEGATIVE
Specific Gravity, UA: 1.015 (ref 1.005–1.030)
Urobilinogen, Ur: 0.2 mg/dL (ref 0.2–1.0)
pH, UA: 5.5 (ref 5.0–7.5)

## 2021-10-31 LAB — MICROSCOPIC EXAMINATION
Bacteria, UA: NONE SEEN
Epithelial Cells (non renal): NONE SEEN /hpf (ref 0–10)
RBC, Urine: NONE SEEN /hpf (ref 0–2)
Renal Epithel, UA: NONE SEEN /hpf
WBC, UA: NONE SEEN /hpf (ref 0–5)

## 2021-10-31 LAB — BLADDER SCAN AMB NON-IMAGING: Scan Result: 100

## 2021-10-31 MED ORDER — TAMSULOSIN HCL 0.4 MG PO CAPS: 0.4000 mg | ORAL_CAPSULE | Freq: Two times a day (BID) | ORAL | 11 refills | Status: DC

## 2021-10-31 NOTE — Progress Notes (Signed)
post void residual=100 

## 2021-10-31 NOTE — Progress Notes (Signed)
10/31/2021 11:06 AM   Tony Patterson 1956/03/24 [id number]  Referring provider: Carylon Perches, MD [address] [place],  [place] [address]  Weak urinary stream   HPI: [name] is a 66yo here for followup for BPH with weak urinary stream. IPSS 18 QOL 3 on flomax BID. His urine stream is intermittently weak. He has nocturia 1-2x. He has intermittent straining to urinate and intermittent urinary hesitancy. No other complaints today   PMH: Past Medical History:  Diagnosis Date   Alcohol abuse    Anemia    Anxiety    Hypertension    Neuropathy    Neuropathy    Seizures (HCC)    unknown etiology and on no meds; been 4 years since seizure.   Sleep apnea     Surgical History: Past Surgical History:  Procedure Laterality Date   COLONOSCOPY  05/18/2007   [id number] hemorrhoids, a single anal papilla, otherwise normal/ Single polyp, as described above, removed    COLONOSCOPY WITH PROPOFOL N/A 03/16/2015   Procedure: COLONOSCOPY WITH PROPOFOL;  Surgeon: Corbin Ade, MD;  Location: [facility];  Service: Endoscopy;  Laterality: N/A;  in cecum at 1344. withdrawal time    CYSTOSCOPY WITH INSERTION OF UROLIFT N/A 03/05/2021   Procedure: CYSTOSCOPY WITH INSERTION OF UROLIFT;  Surgeon: Malen Gauze, MD;  Location: [facility];  Service: Urology;  Laterality: N/A;   THROAT SURGERY     polyps removed x2    Home Medications:  Allergies as of 10/31/2021       Reactions   Bee Venom Anaphylaxis   Penicillins Other (See Comments)   Childhood allergic reaction - no other information available        Medication List        Accurate as of Oct 31, 2021 11:06 AM. If you have any questions, ask your nurse or doctor.          acetaminophen 500 MG tablet Commonly known as: TYLENOL Take 1,000 mg by mouth every 6 (six) hours as needed for moderate pain.   aspirin EC 81 MG tablet Take 81 mg by mouth daily.   atorvastatin 80 MG tablet Commonly known as: LIPITOR Take 40  mg by mouth 2 (two) times daily.   CLEAR EYES OP Place 1 drop into both eyes daily.   EPINEPHrine 0.3 mg/0.3 mL Soaj injection Commonly known as: EPI-PEN Inject 0.3 mg into the muscle as needed for anaphylaxis.   gabapentin 600 MG tablet Commonly known as: NEURONTIN TAKE TWO TABLETS (1200MG ) BY MOUTH THREETIMES A DAY AS NEEDED What changed: See the new instructions.   LORazepam 0.5 MG tablet Commonly known as: ATIVAN Take 0.5 mg by mouth daily as needed for anxiety.   magnesium oxide 400 MG tablet Commonly known as: MAG-OX Take 400 mg by mouth daily.   metoprolol tartrate 50 MG tablet Commonly known as: LOPRESSOR Take 50 mg by mouth 2 (two) times daily.   multivitamin with minerals Tabs tablet Take 1 tablet by mouth daily.   omeprazole 20 MG capsule Commonly known as: PRILOSEC Take 20 mg by mouth daily.   sildenafil 100 MG tablet Commonly known as: VIAGRA Take 100 mg by mouth as needed for erectile dysfunction.   traMADol 50 MG tablet Commonly known as: Ultram Take 1 tablet (50 mg total) by mouth every 6 (six) hours as needed.   Vitamin D 50 MCG (2000 UT) tablet Take 2,000 Units by mouth daily.        Allergies:  Allergies  Allergen Reactions   Bee Venom Anaphylaxis   Penicillins Other (See Comments)    Childhood allergic reaction - no other information available    Family History: Family History  Problem Relation Age of Onset   Fibromyalgia Mother    Dementia Mother    Congestive Heart Failure Mother    Congestive Heart Failure Father    Coronary artery disease Father [phone]       CABG 3 different times   Hypertension Brother    CAD Brother 1666   Hypertension Brother    CAD Brother 5664   Neuropathy Neg Hx    Colon cancer Neg Hx     Social History:  reports that he quit smoking about 26 years ago. His smoking use included cigarettes. He has a 10.00 pack-year smoking history. He has never used smokeless tobacco. He reports that he does not currently  use alcohol. He reports that he does not use drugs.  ROS: All other review of systems were reviewed and are negative except what is noted above in HPI  Physical Exam: BP (!) 151/88   Pulse 60   Constitutional:  Alert and oriented, No acute distress. HEENT: [place] AT, moist mucus membranes.  Trachea midline, no masses. Cardiovascular: No clubbing, cyanosis, or edema. Respiratory: Normal respiratory effort, no increased work of breathing. GI: Abdomen is soft, nontender, nondistended, no abdominal masses GU: No CVA tenderness.  Lymph: No cervical or inguinal lymphadenopathy. Skin: No rashes, bruises or suspicious lesions. Neurologic: Grossly intact, no focal deficits, moving all 4 extremities. Psychiatric: Normal mood and affect.  Laboratory Data: Lab Results  Component Value Date   WBC 6.6 03/01/2021   HGB 13.5 03/01/2021   HCT 42.3 03/01/2021   MCV 92.6 03/01/2021   PLT 224 03/01/2021    Lab Results  Component Value Date   CREATININE 1.82 (H) 03/01/2021    No results found for: PSA  No results found for: TESTOSTERONE  Lab Results  Component Value Date   HGBA1C 5.6 08/24/2015    Urinalysis    Component Value Date/Time   COLORURINE YELLOW 04/12/2016 1046   APPEARANCEUR Clear 05/02/2021 0942   LABSPEC 1.025 04/12/2016 1046   PHURINE 6.5 04/12/2016 1046   GLUCOSEU Negative 05/02/2021 0942   HGBUR LARGE (A) 04/12/2016 1046   BILIRUBINUR Negative 05/02/2021 0942   KETONESUR 15 (A) 04/12/2016 1046   PROTEINUR 2+ (A) 05/02/2021 0942   PROTEINUR >300 (A) 04/12/2016 1046   UROBILINOGEN 0.2 05/06/2013 1015   NITRITE Negative 05/02/2021 0942   NITRITE NEGATIVE 04/12/2016 1046   LEUKOCYTESUR Negative 05/02/2021 0942    Lab Results  Component Value Date   LABMICR Comment 05/02/2021   WBCUA None seen 01/24/2021   LABEPIT 0-10 01/24/2021   MUCUS Present 01/24/2021   BACTERIA None seen 01/24/2021    Pertinent Imaging:  No results found for this or any previous  visit.  No results found for this or any previous visit.  No results found for this or any previous visit.  No results found for this or any previous visit.  No results found for this or any previous visit.  No results found for this or any previous visit.  No results found for this or any previous visit.  No results found for this or any previous visit.   Assessment & Plan:    1. Benign prostatic hyperplasia with urinary obstruction -Patient wishes to continue flomax BID - BLADDER SCAN AMB NON-IMAGING - Urinalysis, Routine w reflex microscopic  2.  Nocturia -contiue flomax 0.4mg  BID - BLADDER SCAN AMB NON-IMAGING - Urinalysis, Routine w reflex microscopic   No follow-ups on file.  [name], MD  [facility] [place]

## 2022-05-01 ENCOUNTER — Encounter: Payer: Self-pay | Admitting: Urology

## 2022-05-01 ENCOUNTER — Ambulatory Visit (INDEPENDENT_AMBULATORY_CARE_PROVIDER_SITE_OTHER): Payer: Medicare Other | Admitting: Urology

## 2022-05-01 VITALS — BP 118/79 | HR 81

## 2022-05-01 DIAGNOSIS — R351 Nocturia: Secondary | ICD-10-CM | POA: Diagnosis not present

## 2022-05-01 DIAGNOSIS — N138 Other obstructive and reflux uropathy: Secondary | ICD-10-CM | POA: Diagnosis not present

## 2022-05-01 DIAGNOSIS — N401 Enlarged prostate with lower urinary tract symptoms: Secondary | ICD-10-CM | POA: Diagnosis not present

## 2022-05-01 DIAGNOSIS — R3912 Poor urinary stream: Secondary | ICD-10-CM

## 2022-05-01 LAB — BLADDER SCAN AMB NON-IMAGING: Scan Result: 100

## 2022-05-01 MED ORDER — SILODOSIN 8 MG PO CAPS: 8.0000 mg | ORAL_CAPSULE | Freq: Every day | ORAL | 11 refills | Status: DC

## 2022-05-01 NOTE — Progress Notes (Signed)
05/01/2022 11:27 AM   Tony Patterson 16-May-1956 [id number]  Referring provider: Carylon Perches, MD [address] [place],  [place] [address]  Followup BPH and weak urinary stream   HPI: Tony Patterson is a 66yo here for followup for BPh with weak urinary stream and nocturia. His stream is weaker since last visit. IPPS 14 QOl 3 on flomax BID. Urine stream mostly weak. Nocturia 2-3x. No straining to urinate.    PMH: Past Medical History:  Diagnosis Date   Alcohol abuse    Anemia    Anxiety    Hypertension    Neuropathy    Neuropathy    Seizures (HCC)    unknown etiology and on no meds; been 4 years since seizure.   Sleep apnea     Surgical History: Past Surgical History:  Procedure Laterality Date   COLONOSCOPY  05/18/2007   [id number] hemorrhoids, a single anal papilla, otherwise normal/ Single polyp, as described above, removed    COLONOSCOPY WITH PROPOFOL N/A 03/16/2015   Procedure: COLONOSCOPY WITH PROPOFOL;  Surgeon: Corbin Ade, MD;  Location: [facility];  Service: Endoscopy;  Laterality: N/A;  in cecum at 1344. withdrawal time    CYSTOSCOPY WITH INSERTION OF UROLIFT N/A 03/05/2021   Procedure: CYSTOSCOPY WITH INSERTION OF UROLIFT;  Surgeon: Malen Gauze, MD;  Location: [facility];  Service: Urology;  Laterality: N/A;   THROAT SURGERY     polyps removed x2    Home Medications:  Allergies as of 05/01/2022       Reactions   Bee Venom Anaphylaxis   Penicillins Other (See Comments)   Childhood allergic reaction - no other information available        Medication List        Accurate as of May 01, 2022 11:27 AM. If you have any questions, ask your nurse or doctor.          acetaminophen 500 MG tablet Commonly known as: TYLENOL Take 1,000 mg by mouth every 6 (six) hours as needed for moderate pain.   aspirin EC 81 MG tablet Take 81 mg by mouth daily.   atorvastatin 80 MG tablet Commonly known as: LIPITOR Take 40 mg by mouth 2 (two) times  daily.   CLEAR EYES OP Place 1 drop into both eyes daily.   EPINEPHrine 0.3 mg/0.3 mL Soaj injection Commonly known as: EPI-PEN Inject 0.3 mg into the muscle as needed for anaphylaxis.   gabapentin 600 MG tablet Commonly known as: NEURONTIN TAKE TWO TABLETS (1200MG ) BY MOUTH THREETIMES A DAY AS NEEDED What changed: See the new instructions.   LORazepam 0.5 MG tablet Commonly known as: ATIVAN Take 0.5 mg by mouth daily as needed for anxiety.   magnesium oxide 400 MG tablet Commonly known as: MAG-OX Take 400 mg by mouth daily.   metoprolol tartrate 50 MG tablet Commonly known as: LOPRESSOR Take 50 mg by mouth 2 (two) times daily.   multivitamin with minerals Tabs tablet Take 1 tablet by mouth daily.   omeprazole 20 MG capsule Commonly known as: PRILOSEC Take 20 mg by mouth daily.   sildenafil 100 MG tablet Commonly known as: VIAGRA Take 100 mg by mouth as needed for erectile dysfunction.   tamsulosin 0.4 MG Caps capsule Commonly known as: FLOMAX Take 1 capsule (0.4 mg total) by mouth 2 (two) times daily.   Vitamin D 50 MCG (2000 UT) tablet Take 2,000 Units by mouth daily.        Allergies:  Allergies  Allergen Reactions   Bee Venom Anaphylaxis   Penicillins Other (See Comments)    Childhood allergic reaction - no other information available    Family History: Family History  Problem Relation Age of Onset   Fibromyalgia Mother    Dementia Mother    Congestive Heart Failure Mother    Congestive Heart Failure Father    Coronary artery disease Father 1       CABG 3 different times   Hypertension Brother    CAD Brother 41   Hypertension Brother    CAD Brother 9   Neuropathy Neg Hx    Colon cancer Neg Hx     Social History:  reports that he quit smoking about 26 years ago. His smoking use included cigarettes. He has a 10.00 pack-year smoking history. He has never used smokeless tobacco. He reports that he does not currently use alcohol. He reports  that he does not use drugs.  ROS: All other review of systems were reviewed and are negative except what is noted above in HPI  Physical Exam: BP 118/79   Pulse 81   Constitutional:  Alert and oriented, No acute distress. HEENT:  AT, moist mucus membranes.  Trachea midline, no masses. Cardiovascular: No clubbing, cyanosis, or edema. Respiratory: Normal respiratory effort, no increased work of breathing. GI: Abdomen is soft, nontender, nondistended, no abdominal masses GU: No CVA tenderness.  Lymph: No cervical or inguinal lymphadenopathy. Skin: No rashes, bruises or suspicious lesions. Neurologic: Grossly intact, no focal deficits, moving all 4 extremities. Psychiatric: Normal mood and affect.  Laboratory Data: Lab Results  Component Value Date   WBC 6.6 03/01/2021   HGB 13.5 03/01/2021   HCT 42.3 03/01/2021   MCV 92.6 03/01/2021   PLT 224 03/01/2021    Lab Results  Component Value Date   CREATININE 1.82 (H) 03/01/2021    No results found for: "PSA"  No results found for: "TESTOSTERONE"  Lab Results  Component Value Date   HGBA1C 5.6 08/24/2015    Urinalysis    Component Value Date/Time   COLORURINE YELLOW 04/12/2016 1046   APPEARANCEUR Clear 10/31/2021 1102   LABSPEC 1.025 04/12/2016 1046   PHURINE 6.5 04/12/2016 1046   GLUCOSEU Negative 10/31/2021 1102   HGBUR LARGE (A) 04/12/2016 1046   BILIRUBINUR Negative 10/31/2021 1102   KETONESUR 15 (A) 04/12/2016 1046   PROTEINUR 2+ (A) 10/31/2021 1102   PROTEINUR >300 (A) 04/12/2016 1046   UROBILINOGEN 0.2 05/06/2013 1015   NITRITE Negative 10/31/2021 1102   NITRITE NEGATIVE 04/12/2016 1046   LEUKOCYTESUR Negative 10/31/2021 1102    Lab Results  Component Value Date   LABMICR See below: 10/31/2021   WBCUA None seen 10/31/2021   LABEPIT None seen 10/31/2021   MUCUS Present 10/31/2021   BACTERIA None seen 10/31/2021    Pertinent Imaging:  No results found for this or any previous visit.  No results  found for this or any previous visit.  No results found for this or any previous visit.  No results found for this or any previous visit.  No results found for this or any previous visit.  No valid procedures specified. No results found for this or any previous visit.  No results found for this or any previous visit.   Assessment & Plan:    1. Benign prostatic hyperplasia with urinary obstruction Rapaflo 8mg  qhs - BLADDER SCAN AMB NON-IMAGING  2. Nocturia Decrease fluid intake within 2 hours of going to bed  3. Weak urinary stream -  Rapaflo 8mg  qhs   No follow-ups on file.  , MD  [facility]

## 2022-05-01 NOTE — Progress Notes (Signed)
post void residual=100 

## 2022-05-01 NOTE — Patient Instructions (Signed)

## 2022-07-08 ENCOUNTER — Telehealth: Payer: Self-pay

## 2022-07-08 NOTE — Telephone Encounter (Signed)
   Medication denial for Silodosin- Please see recommended tried and failed- patient only taken flomax BID  Message sent to MD

## 2022-07-18 NOTE — Telephone Encounter (Signed)
I called patient to inquire if he picked up the new prescription for silodosin. Patient did not.   Patient wishes to stay on flomax BID and not switch to alfuzosin

## 2022-10-30 ENCOUNTER — Ambulatory Visit (INDEPENDENT_AMBULATORY_CARE_PROVIDER_SITE_OTHER): Payer: Medicare Other | Admitting: Urology

## 2022-10-30 ENCOUNTER — Encounter: Payer: Self-pay | Admitting: Urology

## 2022-10-30 VITALS — BP 122/75 | HR 58

## 2022-10-30 DIAGNOSIS — R351 Nocturia: Secondary | ICD-10-CM | POA: Diagnosis not present

## 2022-10-30 DIAGNOSIS — N401 Enlarged prostate with lower urinary tract symptoms: Secondary | ICD-10-CM | POA: Diagnosis not present

## 2022-10-30 DIAGNOSIS — R3912 Poor urinary stream: Secondary | ICD-10-CM

## 2022-10-30 DIAGNOSIS — N138 Other obstructive and reflux uropathy: Secondary | ICD-10-CM | POA: Diagnosis not present

## 2022-10-30 LAB — URINALYSIS, ROUTINE W REFLEX MICROSCOPIC
Bilirubin, UA: NEGATIVE
Glucose, UA: NEGATIVE
Ketones, UA: NEGATIVE
Leukocytes,UA: NEGATIVE
Nitrite, UA: NEGATIVE
RBC, UA: NEGATIVE
Specific Gravity, UA: 1.005 (ref 1.005–1.030)
Urobilinogen, Ur: 0.2 mg/dL (ref 0.2–1.0)
pH, UA: 5.5 (ref 5.0–7.5)

## 2022-10-30 LAB — BLADDER SCAN AMB NON-IMAGING

## 2022-10-30 NOTE — Progress Notes (Unsigned)
10/30/2022 11:42 AM   Tony Patterson [birth year] [id number]  Referring provider: Carylon Perches, MD [address] [place],  [place] [address]  Chief Complaint  Patient presents with   Benign Prostatic Hypertrophy    HPI: Tony Patterson is a 63ZH here for followup for BPH with incomplete emptying. PVR 279cc. IPSS 12 QOL 3 on flomax 0.4mg  1x2 per day. Urine stream is fair. He was initially doing well after urolift but his stream is weaker over the past year.     PMH: Past Medical History:  Diagnosis Date   Alcohol abuse    Anemia    Anxiety    Hypertension    Neuropathy    Neuropathy    Seizures (HCC)    unknown etiology and on no meds; been 4 years since seizure.   Sleep apnea     Surgical History: Past Surgical History:  Procedure Laterality Date   COLONOSCOPY  05/18/2007   YQM:[id number] hemorrhoids, a single anal papilla, otherwise normal/ Single polyp, as described above, removed    COLONOSCOPY WITH PROPOFOL N/A 03/16/2015   Procedure: COLONOSCOPY WITH PROPOFOL;  Surgeon: Corbin Ade, MD;  Location: [facility];  Service: Endoscopy;  Laterality: N/A;  in cecum at 1344. withdrawal time    CYSTOSCOPY WITH INSERTION OF UROLIFT N/A 03/05/2021   Procedure: CYSTOSCOPY WITH INSERTION OF UROLIFT;  Surgeon: Malen Gauze, MD;  Location: [facility];  Service: Urology;  Laterality: N/A;   THROAT SURGERY     polyps removed x2    Home Medications:  Allergies as of 10/30/2022       Reactions   Bee Venom Anaphylaxis   Penicillins Other (See Comments)   Childhood allergic reaction - no other information available        Medication List        Accurate as of Oct 30, 2022 11:42 AM. If you have any questions, ask your nurse or doctor.          acetaminophen 500 MG tablet Commonly known as: TYLENOL Take 1,000 mg by mouth every 6 (six) hours as needed for moderate pain.   aspirin EC 81 MG tablet Take 81 mg by mouth daily.   atorvastatin 80 MG tablet Commonly known  as: LIPITOR Take 40 mg by mouth 2 (two) times daily.   CLEAR EYES OP Place 1 drop into both eyes daily.   EPINEPHrine 0.3 mg/0.3 mL Soaj injection Commonly known as: EPI-PEN Inject 0.3 mg into the muscle as needed for anaphylaxis.   gabapentin 600 MG tablet Commonly known as: NEURONTIN TAKE TWO TABLETS (1200MG ) BY MOUTH THREETIMES A DAY AS NEEDED What changed: See the new instructions.   LORazepam 0.5 MG tablet Commonly known as: ATIVAN Take 0.5 mg by mouth daily as needed for anxiety.   magnesium oxide 400 MG tablet Commonly known as: MAG-OX Take 400 mg by mouth daily.   metoprolol tartrate 50 MG tablet Commonly known as: LOPRESSOR Take 50 mg by mouth 2 (two) times daily.   multivitamin with minerals Tabs tablet Take 1 tablet by mouth daily.   omeprazole 20 MG capsule Commonly known as: PRILOSEC Take 20 mg by mouth daily.   sildenafil 100 MG tablet Commonly known as: VIAGRA Take 100 mg by mouth as needed for erectile dysfunction.   tamsulosin 0.4 MG Caps capsule Commonly known as: FLOMAX Take 1 capsule (0.4 mg total) by mouth 2 (two) times daily.   Veltassa 8.4 g packet Generic drug: patiromer Take by mouth.  Vitamin D 50 MCG (2000 UT) tablet Take 2,000 Units by mouth daily.        Allergies:  Allergies  Allergen Reactions   Bee Venom Anaphylaxis   Penicillins Other (See Comments)    Childhood allergic reaction - no other information available    Family History: Family History  Problem Relation Age of Onset   Fibromyalgia Mother    Dementia Mother    Congestive Heart Failure Mother    Congestive Heart Failure Father    Coronary artery disease Father 27       CABG 3 different times   Hypertension Brother    CAD Brother 39   Hypertension Brother    CAD Brother 24   Neuropathy Neg Hx    Colon cancer Neg Hx     Social History:  reports that he quit smoking about 27 years ago. His smoking use included cigarettes. He has a 10.00 pack-year  smoking history. He has never used smokeless tobacco. He reports that he does not currently use alcohol. He reports that he does not use drugs.  ROS: All other review of systems were reviewed and are negative except what is noted above in HPI  Physical Exam: BP 122/75   Pulse (!) 58   Constitutional:  Alert and oriented, No acute distress. HEENT:  AT, moist mucus membranes.  Trachea midline, no masses. Cardiovascular: No clubbing, cyanosis, or edema. Respiratory: Normal respiratory effort, no increased work of breathing. GI: Abdomen is soft, nontender, nondistended, no abdominal masses GU: No CVA tenderness.  Lymph: No cervical or inguinal lymphadenopathy. Skin: No rashes, bruises or suspicious lesions. Neurologic: Grossly intact, no focal deficits, moving all 4 extremities. Psychiatric: Normal mood and affect.  Laboratory Data: Lab Results  Component Value Date   WBC 6.6 03/01/2021   HGB 13.5 03/01/2021   HCT 42.3 03/01/2021   MCV 92.6 03/01/2021   PLT 224 03/01/2021    Lab Results  Component Value Date   CREATININE 1.82 (H) 03/01/2021    No results found for: "PSA"  No results found for: "TESTOSTERONE"  Lab Results  Component Value Date   HGBA1C 5.6 08/24/2015    Urinalysis    Component Value Date/Time   COLORURINE YELLOW 04/12/2016 1046   APPEARANCEUR Clear 10/31/2021 1102   LABSPEC 1.025 04/12/2016 1046   PHURINE 6.5 04/12/2016 1046   GLUCOSEU Negative 10/31/2021 1102   HGBUR LARGE (A) 04/12/2016 1046   BILIRUBINUR Negative 10/31/2021 1102   KETONESUR 15 (A) 04/12/2016 1046   PROTEINUR 2+ (A) 10/31/2021 1102   PROTEINUR >300 (A) 04/12/2016 1046   UROBILINOGEN 0.2 05/06/2013 1015   NITRITE Negative 10/31/2021 1102   NITRITE NEGATIVE 04/12/2016 1046   LEUKOCYTESUR Negative 10/31/2021 1102    Lab Results  Component Value Date   LABMICR See below: 10/31/2021   WBCUA None seen 10/31/2021   LABEPIT None seen 10/31/2021   MUCUS Present 10/31/2021    BACTERIA None seen 10/31/2021    Pertinent Imaging: *** No results found for this or any previous visit.  No results found for this or any previous visit.  No results found for this or any previous visit.  No results found for this or any previous visit.  No results found for this or any previous visit.  No valid procedures specified. No results found for this or any previous visit.  No results found for this or any previous visit.   Assessment & Plan:    1. Benign prostatic hyperplasia with urinary obstruction *** -  Urinalysis, Routine w reflex microscopic - BLADDER SCAN AMB NON-IMAGING  2. Nocturia ***  3. Weak urinary stream ***   No follow-ups on file.  [name], MD  [facility] [place]    10/30/22  CC:  Chief Complaint  Patient presents with   Benign Prostatic Hypertrophy    HPI:  Blood pressure 122/75, pulse (!) 58. NED. A&Ox3.   No respiratory distress   Abd soft, NT, ND Normal phallus with bilateral descended testicles  Cystoscopy Procedure Note  Patient identification was confirmed, informed consent was obtained, and patient was prepped using Betadine solution.  Lidocaine jelly was administered per urethral meatus.     Pre-Procedure: - Inspection reveals a normal caliber ureteral meatus.  Procedure: The flexible cystoscope was introduced without difficulty - No urethral strictures/lesions are present. - {Blank multiple:19197::"Enlarged","Surgically absent","Normal"} prostate *** - {Blank multiple:19197::"Normal","Elevated","Tight"} bladder neck - Bilateral ureteral orifices identified - Bladder mucosa  reveals no ulcers, tumors, or lesions - No bladder stones - No trabeculation  Retroflexion shows ***   Post-Procedure: - Patient tolerated the procedure well  Assessment/ Plan: We discussed the management of his BPH including continued medical therapy, Rezum, Urolift, TURP and simple prostatectomy. After  discussing the options the patient has elected to proceed with TURP. Risks/benefits/alternatives discussed.   No follow-ups on file.  [name], MD

## 2022-10-31 ENCOUNTER — Encounter: Payer: Self-pay | Admitting: Urology

## 2022-10-31 NOTE — Patient Instructions (Signed)

## 2022-11-08 ENCOUNTER — Telehealth: Payer: Self-pay

## 2022-11-08 NOTE — Telephone Encounter (Signed)
I spoke with Tony Patterson. We have discussed possible surgery dates and 12/12/2022 was agreed upon by all parties. Patient given information about surgery date, what to expect pre-operatively and post operatively.    We discussed that a pre-op nurse will be calling to set up the pre-op visit that will take place prior to surgery. Informed patient that our office will communicate any additional care to be provided after surgery.    Patients questions or concerns were discussed during our call. Advised to call our office should there be any additional information, questions or concerns that arise. Patient verbalized understanding.

## 2022-12-10 NOTE — Patient Instructions (Signed)
Tony Patterson  12/10/2022     @PREFPERIOPPHARMACY @   Your procedure is scheduled on  12/12/2022.   Report to [facility] at  1130 A.M.   Call this number if you have problems the morning of surgery:  [phone]  If you experience any cold or flu symptoms such as cough, fever, chills, shortness of breath, etc. between now and your scheduled surgery, please notify us at the above number.   Remember:  Do not eat or drink after midnight.      Take these medicines the morning of surgery with A SIP OF WATER      gabapentin, lorazepam, metoprolol, omeprazole, flomax.     Do not wear jewelry, make-up or nail polish, including gel polish,  artificial nails, or any other type of covering on natural nails (fingers and  toes).  Do not wear lotions, powders, or perfumes, or deodorant.  Do not shave 48 hours prior to surgery.  Men may shave face and neck.  Do not bring valuables to the hospital.  [facility] is not responsible for any belongings or valuables.  Contacts, dentures or bridgework may not be worn into surgery.  Leave your suitcase in the car.  After surgery it may be brought to your room.  For patients admitted to the hospital, discharge time will be determined by your treatment team.  Patients discharged the day of surgery will not be allowed to drive home and must have someone with them for 24 hours.    Special instructions:   DO NOT smoke tobacco or vape for 24 hours before your procedure.  Please read over the following fact sheets that you were given. Pain Booklet, Coughing and Deep Breathing, Surgical Site Infection Prevention, Anesthesia Post-op Instructions, and Care and Recovery After Surgery        Transurethral Resection of the Prostate, Care After The following information offers guidance on how to care for yourself after your procedure. Your health care provider may also give you more specific instructions. If you have problems or questions,  contact your health care provider. What can I expect after the procedure? After the procedure, it is common to have: Mild pain in your lower abdomen. Soreness or mild discomfort in your penis or when you urinate. This is from having the catheter inserted during the procedure. A sudden urge to urinate (urgency). A need to urinate often. A small amount of blood in your urine. You may notice some small blood clots in your urine. These are normal. Follow these instructions at home: Medicines Take over-the-counter and prescription medicines only as told by your health care provider. If you were prescribed an antibiotic medicine, take it as told by your health care provider. Do not stop taking the antibiotic even if you start to feel better. Activity  Rest as told by your health care provider. Avoid sitting for a long time without moving. Get up to take short walks every 1-2 hours. This is important to improve blood flow and breathing. Ask for help if you feel weak or unsteady. You may increase your physical activity gradually as you start to feel better. Do not drive or operate machinery until your health care provider says that it is safe. Do not ride in a car for long periods of time, or as told by your health care provider. Avoid intense physical activity for as long as told by your health care provider. Do not lift anything that is heavier than 10  lb (4.5 kg), or the limit that you are told, until your health care provider says that it is safe. Do not have sex until your health care provider approves. Return to your normal activities as told by your health care provider. Ask your health care provider what activities are safe for you. Preventing constipation You may need to take these actions to prevent or treat constipation: Drink enough fluid to keep your urine pale yellow. Take over-the-counter or prescription medicines. Eat foods that are high in fiber, such as beans, whole grains, and  fresh fruits and vegetables. Limit foods that are high in fat and processed sugars, such as fried or sweet foods.  General instructions Do not strain when you have a bowel movement. Straining may lead to bleeding from the prostate. This may cause blood clots and trouble urinating. Do not use any products that contain nicotine or tobacco. These products include cigarettes, chewing tobacco, and vaping devices, such as e-cigarettes. If you need help quitting, ask your health care provider. If you go home with a tube draining your urine (urinary catheter), care for the catheter as told by your health care provider. Wear compression stockings as told by your health care provider. These stockings help to prevent blood clots and reduce swelling in your legs. Keep all follow-up visits. This is important. Contact a health care provider if: You have signs of infection, such as: Fever or chills. Urine that smells very bad. Swelling around your urethra that is getting worse. Swelling in your penis or testicles. You have difficulty urinating. You have pain that gets worse or does not improve with medicine. You have blood in your urine that does not go away after 1 week of resting and drinking more fluids. You have trouble having a bowel movement. You have trouble having or keeping an erection. No semen comes out during orgasm (dry ejaculation). You have a urinary catheter in place, and you have: Spasms or pain. Problems with your catheter or your catheter is blocked. Get help right away if: You are unable to urinate. You are having more blood clots in your urine instead of fewer. You have: Large blood clots. A lot of blood in your urine. Pain in your back or lower abdomen. You have difficulty breathing or shortness of breath. You develop swelling or pain in your leg. These symptoms may be an emergency. Get help right away. Call 911. Do not wait to see if the symptoms will go away. Do not drive  yourself to the hospital. Summary After the procedure, it is common to have a small amount of blood in your urine. Follow restrictions about lifting and sexual activity as told by your health care provider. Ask what activities are safe for you. Keep all follow-up visits. This is important. This information is not intended to replace advice given to you by your health care provider. Make sure you discuss any questions you have with your health care provider. Document Revised: 02/13/2021 Document Reviewed: 02/13/2021 Elsevier Patient Education  2024 [employer] General Anesthesia, Adult, Care After The following information offers guidance on how to care for yourself after your procedure. Your health care provider may also give you more specific instructions. If you have problems or questions, contact your health care provider. What can I expect after the procedure? After the procedure, it is common for people to: Have pain or discomfort at the IV site. Have nausea or vomiting. Have a sore throat or hoarseness. Have trouble concentrating. Feel cold or  chills. Feel weak, sleepy, or tired (fatigue). Have soreness and body aches. These can affect parts of the body that were not involved in surgery. Follow these instructions at home: For the time period you were told by your health care provider:  Rest. Do not participate in activities where you could fall or become injured. Do not drive or use machinery. Do not drink alcohol. Do not take sleeping pills or medicines that cause drowsiness. Do not make important decisions or sign legal documents. Do not take care of children on your own. General instructions Drink enough fluid to keep your urine pale yellow. If you have sleep apnea, surgery and certain medicines can increase your risk for breathing problems. Follow instructions from your health care provider about wearing your sleep device: Anytime you are sleeping, including during  daytime naps. While taking prescription pain medicines, sleeping medicines, or medicines that make you drowsy. Return to your normal activities as told by your health care provider. Ask your health care provider what activities are safe for you. Take over-the-counter and prescription medicines only as told by your health care provider. Do not use any products that contain nicotine or tobacco. These products include cigarettes, chewing tobacco, and vaping devices, such as e-cigarettes. These can delay incision healing after surgery. If you need help quitting, ask your health care provider. Contact a health care provider if: You have nausea or vomiting that does not get better with medicine. You vomit every time you eat or drink. You have pain that does not get better with medicine. You cannot urinate or have bloody urine. You develop a skin rash. You have a fever. Get help right away if: You have trouble breathing. You have chest pain. You vomit blood. These symptoms may be an emergency. Get help right away. Call 911. Do not wait to see if the symptoms will go away. Do not drive yourself to the hospital. Summary After the procedure, it is common to have a sore throat, hoarseness, nausea, vomiting, or to feel weak, sleepy, or fatigue. For the time period you were told by your health care provider, do not drive or use machinery. Get help right away if you have difficulty breathing, have chest pain, or vomit blood. These symptoms may be an emergency. This information is not intended to replace advice given to you by your health care provider. Make sure you discuss any questions you have with your health care provider. Document Revised: 08/17/2021 Document Reviewed: 08/17/2021 Elsevier Patient Education  2024 [employer] How to Use Chlorhexidine Before Surgery Chlorhexidine gluconate (CHG) is a germ-killing (antiseptic) solution that is used to clean the skin. It can get rid of the bacteria  that normally live on the skin and can keep them away for about 24 hours. To clean your skin with CHG, you may be given: A CHG solution to use in the shower or as part of a sponge bath. A prepackaged cloth that contains CHG. Cleaning your skin with CHG may help lower the risk for infection: While you are staying in the intensive care unit of the hospital. If you have a vascular access, such as a central line, to provide short-term or long-term access to your veins. If you have a catheter to drain urine from your bladder. If you are on a ventilator. A ventilator is a machine that helps you breathe by moving air in and out of your lungs. After surgery. What are the risks? Risks of using CHG include: A skin reaction. Patterson  loss, if CHG gets in your ears and you have a perforated eardrum. Eye injury, if CHG gets in your eyes and is not rinsed out. The CHG product catching fire. Make sure that you avoid smoking and flames after applying CHG to your skin. Do not use CHG: If you have a chlorhexidine allergy or have previously reacted to chlorhexidine. On babies younger than 15 months of age. How to use CHG solution Use CHG only as told by your health care provider, and follow the instructions on the label. Use the full amount of CHG as directed. Usually, this is one bottle. During a shower Follow these steps when using CHG solution during a shower (unless your health care provider gives you different instructions): Start the shower. Use your normal soap and shampoo to wash your face and hair. Turn off the shower or move out of the shower stream. Pour the CHG onto a clean washcloth. Do not use any type of brush or rough-edged sponge. Starting at your neck, lather your body down to your toes. Make sure you follow these instructions: If you will be having surgery, pay special attention to the part of your body where you will be having surgery. Scrub this area for at least 1 minute. Do not use  CHG on your head or face. If the solution gets into your ears or eyes, rinse them well with water. Avoid your genital area. Avoid any areas of skin that have broken skin, cuts, or scrapes. Scrub your back and under your arms. Make sure to wash skin folds. Let the lather sit on your skin for 1-2 minutes or as long as told by your health care provider. Thoroughly rinse your entire body in the shower. Make sure that all body creases and crevices are rinsed well. Dry off with a clean towel. Do not put any substances on your body afterward--such as powder, lotion, or perfume--unless you are told to do so by your health care provider. Only use lotions that are recommended by the manufacturer. Put on clean clothes or pajamas. If it is the night before your surgery, sleep in clean sheets.  During a sponge bath Follow these steps when using CHG solution during a sponge bath (unless your health care provider gives you different instructions): Use your normal soap and shampoo to wash your face and hair. Pour the CHG onto a clean washcloth. Starting at your neck, lather your body down to your toes. Make sure you follow these instructions: If you will be having surgery, pay special attention to the part of your body where you will be having surgery. Scrub this area for at least 1 minute. Do not use CHG on your head or face. If the solution gets into your ears or eyes, rinse them well with water. Avoid your genital area. Avoid any areas of skin that have broken skin, cuts, or scrapes. Scrub your back and under your arms. Make sure to wash skin folds. Let the lather sit on your skin for 1-2 minutes or as long as told by your health care provider. Using a different clean, wet washcloth, thoroughly rinse your entire body. Make sure that all body creases and crevices are rinsed well. Dry off with a clean towel. Do not put any substances on your body afterward--such as powder, lotion, or perfume--unless you are  told to do so by your health care provider. Only use lotions that are recommended by the manufacturer. Put on clean clothes or pajamas. If it is the  night before your surgery, sleep in clean sheets. How to use CHG prepackaged cloths Only use CHG cloths as told by your health care provider, and follow the instructions on the label. Use the CHG cloth on clean, dry skin. Do not use the CHG cloth on your head or face unless your health care provider tells you to. When washing with the CHG cloth: Avoid your genital area. Avoid any areas of skin that have broken skin, cuts, or scrapes. Before surgery Follow these steps when using a CHG cloth to clean before surgery (unless your health care provider gives you different instructions): Using the CHG cloth, vigorously scrub the part of your body where you will be having surgery. Scrub using a back-and-forth motion for 3 minutes. The area on your body should be completely wet with CHG when you are done scrubbing. Do not rinse. Discard the cloth and let the area air-dry. Do not put any substances on the area afterward, such as powder, lotion, or perfume. Put on clean clothes or pajamas. If it is the night before your surgery, sleep in clean sheets.  For general bathing Follow these steps when using CHG cloths for general bathing (unless your health care provider gives you different instructions). Use a separate CHG cloth for each area of your body. Make sure you wash between any folds of skin and between your fingers and toes. Wash your body in the following order, switching to a new cloth after each step: The front of your neck, shoulders, and chest. Both of your arms, under your arms, and your hands. Your stomach and groin area, avoiding the genitals. Your right leg and foot. Your left leg and foot. The back of your neck, your back, and your buttocks. Do not rinse. Discard the cloth and let the area air-dry. Do not put any substances on your body  afterward--such as powder, lotion, or perfume--unless you are told to do so by your health care provider. Only use lotions that are recommended by the manufacturer. Put on clean clothes or pajamas. Contact a health care provider if: Your skin gets irritated after scrubbing. You have questions about using your solution or cloth. You swallow any chlorhexidine. Call your local poison control center ([phone] in the [place] Get help right away if: Your eyes itch badly, or they become very red or swollen. Your skin itches badly and is red or swollen. Your Patterson changes. You have trouble seeing. You have swelling or tingling in your mouth or throat. You have trouble breathing. These symptoms may represent a serious problem that is an emergency. Do not wait to see if the symptoms will go away. Get medical help right away. Call your local emergency services (911 in the [place] Do not drive yourself to the hospital. Summary Chlorhexidine gluconate (CHG) is a germ-killing (antiseptic) solution that is used to clean the skin. Cleaning your skin with CHG may help to lower your risk for infection. You may be given CHG to use for bathing. It may be in a bottle or in a prepackaged cloth to use on your skin. Carefully follow your health care provider's instructions and the instructions on the product label. Do not use CHG if you have a chlorhexidine allergy. Contact your health care provider if your skin gets irritated after scrubbing. This information is not intended to replace advice given to you by your health care provider. Make sure you discuss any questions you have with your health care provider. Document Revised: 09/17/2021 Document Reviewed: 07/31/2020  Elsevier Patient Education  2023 [employer].

## 2022-12-11 ENCOUNTER — Encounter (HOSPITAL_COMMUNITY)
Admission: RE | Admit: 2022-12-11 | Discharge: 2022-12-11 | Disposition: A | Discharge status: Home / Self Care | Payer: Medicare Other | Source: Ambulatory Visit | Attending: Urology | Admitting: Urology

## 2022-12-11 ENCOUNTER — Encounter (HOSPITAL_COMMUNITY): Payer: Self-pay

## 2022-12-11 VITALS — BP 129/67 | HR 63 | Temp 97.8°F | Resp 18 | Ht 75.0 in | Wt 254.0 lb

## 2022-12-11 DIAGNOSIS — N189 Chronic kidney disease, unspecified: Secondary | ICD-10-CM

## 2022-12-11 DIAGNOSIS — Z01818 Encounter for other preprocedural examination: Secondary | ICD-10-CM | POA: Insufficient documentation

## 2022-12-11 DIAGNOSIS — F1010 Alcohol abuse, uncomplicated: Secondary | ICD-10-CM

## 2022-12-11 DIAGNOSIS — E46 Unspecified protein-calorie malnutrition: Secondary | ICD-10-CM | POA: Diagnosis not present

## 2022-12-11 DIAGNOSIS — R001 Bradycardia, unspecified: Secondary | ICD-10-CM | POA: Diagnosis not present

## 2022-12-11 DIAGNOSIS — E8809 Other disorders of plasma-protein metabolism, not elsewhere classified: Secondary | ICD-10-CM | POA: Diagnosis not present

## 2022-12-11 DIAGNOSIS — I10 Essential (primary) hypertension: Secondary | ICD-10-CM

## 2022-12-11 DIAGNOSIS — E876 Hypokalemia: Secondary | ICD-10-CM | POA: Diagnosis not present

## 2022-12-11 DIAGNOSIS — I129 Hypertensive chronic kidney disease with stage 1 through stage 4 chronic kidney disease, or unspecified chronic kidney disease: Secondary | ICD-10-CM | POA: Insufficient documentation

## 2022-12-11 LAB — CBC WITH DIFFERENTIAL/PLATELET
Abs Immature Granulocytes: 0.02 10*3/uL (ref 0.00–0.07)
Basophils Absolute: 0 10*3/uL (ref 0.0–0.1)
Basophils Relative: 1 %
Eosinophils Absolute: 0.1 10*3/uL (ref 0.0–0.5)
Eosinophils Relative: 3 %
HCT: 40.6 % (ref 39.0–52.0)
Hemoglobin: 12.7 g/dL — ABNORMAL LOW (ref 13.0–17.0)
Immature Granulocytes: 0 %
Lymphocytes Relative: 27 %
Lymphs Abs: 1.4 10*3/uL (ref 0.7–4.0)
MCH: 28.6 pg (ref 26.0–34.0)
MCHC: 31.3 g/dL (ref 30.0–36.0)
MCV: 91.4 fL (ref 80.0–100.0)
Monocytes Absolute: 0.4 10*3/uL (ref 0.1–1.0)
Monocytes Relative: 8 %
Neutro Abs: 3.2 10*3/uL (ref 1.7–7.7)
Neutrophils Relative %: 61 %
Platelets: 203 10*3/uL (ref 150–400)
RBC: 4.44 MIL/uL (ref 4.22–5.81)
RDW: 13.6 % (ref 11.5–15.5)
WBC: 5.3 10*3/uL (ref 4.0–10.5)
nRBC: 0 % (ref 0.0–0.2)

## 2022-12-11 LAB — COMPREHENSIVE METABOLIC PANEL
ALT: 12 U/L (ref 0–44)
AST: 11 U/L — ABNORMAL LOW (ref 15–41)
Albumin: 3.7 g/dL (ref 3.5–5.0)
Alkaline Phosphatase: 70 U/L (ref 38–126)
Anion gap: 7 (ref 5–15)
BUN: 28 mg/dL — ABNORMAL HIGH (ref 8–23)
CO2: 25 mmol/L (ref 22–32)
Calcium: 8.8 mg/dL — ABNORMAL LOW (ref 8.9–10.3)
Chloride: 103 mmol/L (ref 98–111)
Creatinine, Ser: 1.75 mg/dL — ABNORMAL HIGH (ref 0.61–1.24)
GFR, Estimated: 42 mL/min — ABNORMAL LOW (ref >60)
Glucose, Bld: 90 mg/dL (ref 70–99)
Potassium: 5.2 mmol/L — ABNORMAL HIGH (ref 3.5–5.1)
Sodium: 135 mmol/L (ref 135–145)
Total Bilirubin: 0.9 mg/dL (ref 0.3–1.2)
Total Protein: 6.8 g/dL (ref 6.5–8.1)

## 2022-12-12 ENCOUNTER — Encounter (HOSPITAL_COMMUNITY)
Admission: RE | Disposition: A | Discharge status: Home / Self Care | Payer: Self-pay | Source: Home / Self Care | Attending: Urology

## 2022-12-12 ENCOUNTER — Ambulatory Visit (HOSPITAL_BASED_OUTPATIENT_CLINIC_OR_DEPARTMENT_OTHER): Payer: Medicare Other | Admitting: Anesthesiology

## 2022-12-12 ENCOUNTER — Other Ambulatory Visit: Payer: Self-pay

## 2022-12-12 ENCOUNTER — Observation Stay (HOSPITAL_COMMUNITY)
Admission: RE | Admit: 2022-12-12 | Discharge: 2022-12-13 | Disposition: A | Discharge status: Home / Self Care | Payer: Medicare Other | Attending: Urology | Admitting: Urology

## 2022-12-12 ENCOUNTER — Ambulatory Visit (HOSPITAL_COMMUNITY): Payer: Self-pay | Admitting: Anesthesiology

## 2022-12-12 ENCOUNTER — Encounter (HOSPITAL_COMMUNITY): Payer: Self-pay | Admitting: Urology

## 2022-12-12 DIAGNOSIS — Z87891 Personal history of nicotine dependence: Secondary | ICD-10-CM | POA: Diagnosis not present

## 2022-12-12 DIAGNOSIS — N401 Enlarged prostate with lower urinary tract symptoms: Secondary | ICD-10-CM

## 2022-12-12 DIAGNOSIS — I129 Hypertensive chronic kidney disease with stage 1 through stage 4 chronic kidney disease, or unspecified chronic kidney disease: Secondary | ICD-10-CM

## 2022-12-12 DIAGNOSIS — N138 Other obstructive and reflux uropathy: Secondary | ICD-10-CM

## 2022-12-12 DIAGNOSIS — R3912 Poor urinary stream: Secondary | ICD-10-CM

## 2022-12-12 DIAGNOSIS — N139 Obstructive and reflux uropathy, unspecified: Secondary | ICD-10-CM | POA: Diagnosis not present

## 2022-12-12 DIAGNOSIS — N400 Enlarged prostate without lower urinary tract symptoms: Secondary | ICD-10-CM | POA: Diagnosis present

## 2022-12-12 DIAGNOSIS — I10 Essential (primary) hypertension: Secondary | ICD-10-CM | POA: Insufficient documentation

## 2022-12-12 DIAGNOSIS — N189 Chronic kidney disease, unspecified: Secondary | ICD-10-CM | POA: Diagnosis not present

## 2022-12-12 DIAGNOSIS — R3914 Feeling of incomplete bladder emptying: Secondary | ICD-10-CM | POA: Diagnosis not present

## 2022-12-12 HISTORY — PX: TRANSURETHRAL RESECTION OF PROSTATE: SHX73

## 2022-12-12 LAB — CBC
HCT: 43.7 % (ref 39.0–52.0)
Hemoglobin: 13.3 g/dL (ref 13.0–17.0)
MCH: 28.7 pg (ref 26.0–34.0)
MCHC: 30.4 g/dL (ref 30.0–36.0)
MCV: 94.4 fL (ref 80.0–100.0)
RBC: 4.63 MIL/uL (ref 4.22–5.81)
RDW: 13.5 % (ref 11.5–15.5)
WBC: 5.3 10*3/uL (ref 4.0–10.5)
nRBC: 0 % (ref 0.0–0.2)

## 2022-12-12 LAB — BASIC METABOLIC PANEL
Anion gap: 8 (ref 5–15)
BUN: 27 mg/dL — ABNORMAL HIGH (ref 8–23)
CO2: 21 mmol/L — ABNORMAL LOW (ref 22–32)
Calcium: 8.5 mg/dL — ABNORMAL LOW (ref 8.9–10.3)
Chloride: 109 mmol/L (ref 98–111)
Creatinine, Ser: 1.63 mg/dL — ABNORMAL HIGH (ref 0.61–1.24)
GFR, Estimated: 46 mL/min — ABNORMAL LOW (ref >60)
Glucose, Bld: 117 mg/dL — ABNORMAL HIGH (ref 70–99)
Potassium: 4.8 mmol/L (ref 3.5–5.1)
Sodium: 138 mmol/L (ref 135–145)

## 2022-12-12 SURGERY — TURP (TRANSURETHRAL RESECTION OF PROSTATE)
Anesthesia: General | Site: Prostate

## 2022-12-12 MED ORDER — CHLORHEXIDINE GLUCONATE 0.12 % MT SOLN
15.0000 mL | Freq: Once | OROMUCOSAL | Status: AC
  Administered 2022-12-12: 15 mL via OROMUCOSAL

## 2022-12-12 MED ORDER — ZOLPIDEM TARTRATE 5 MG PO TABS
5.0000 mg | ORAL_TABLET | Freq: Every evening | ORAL | Status: DC | PRN
  Administered 2022-12-12: 5 mg via ORAL
  Filled 2022-12-12: qty 1

## 2022-12-12 MED ORDER — FENTANYL CITRATE (PF) 100 MCG/2ML IJ SOLN
INTRAMUSCULAR | Status: AC
  Filled 2022-12-12: qty 2

## 2022-12-12 MED ORDER — LIDOCAINE HCL (CARDIAC) PF 50 MG/5ML IV SOSY
PREFILLED_SYRINGE | INTRAVENOUS | Status: DC | PRN
  Administered 2022-12-12: 100 mg via INTRAVENOUS

## 2022-12-12 MED ORDER — MIDAZOLAM HCL 5 MG/5ML IJ SOLN
INTRAMUSCULAR | Status: DC | PRN
  Administered 2022-12-12: 2 mg via INTRAVENOUS

## 2022-12-12 MED ORDER — WATER FOR IRRIGATION, STERILE IR SOLN
Status: DC | PRN
  Administered 2022-12-12: 500 mL

## 2022-12-12 MED ORDER — SODIUM CHLORIDE 0.9 % IV SOLN: INTRAVENOUS | Status: DC

## 2022-12-12 MED ORDER — LACTATED RINGERS IV SOLN: INTRAVENOUS | Status: DC

## 2022-12-12 MED ORDER — ONDANSETRON HCL 4 MG/2ML IJ SOLN
INTRAMUSCULAR | Status: DC | PRN
  Administered 2022-12-12: 4 mg via INTRAVENOUS

## 2022-12-12 MED ORDER — HYDROMORPHONE HCL 1 MG/ML IJ SOLN
INTRAMUSCULAR | Status: AC
  Filled 2022-12-12: qty 0.5

## 2022-12-12 MED ORDER — METOPROLOL TARTRATE 50 MG PO TABS
50.0000 mg | ORAL_TABLET | Freq: Two times a day (BID) | ORAL | Status: DC
  Administered 2022-12-12 – 2022-12-13 (×2): 50 mg via ORAL
  Filled 2022-12-12 (×2): qty 1

## 2022-12-12 MED ORDER — EPHEDRINE 5 MG/ML INJ
INTRAVENOUS | Status: AC
  Filled 2022-12-12: qty 5

## 2022-12-12 MED ORDER — HYDROMORPHONE HCL 1 MG/ML IJ SOLN
0.2500 mg | INTRAMUSCULAR | Status: DC | PRN
  Administered 2022-12-12: 0.5 mg via INTRAVENOUS

## 2022-12-12 MED ORDER — ACETAMINOPHEN 325 MG PO TABS: 650.0000 mg | ORAL_TABLET | ORAL | Status: DC | PRN

## 2022-12-12 MED ORDER — LIDOCAINE HCL (PF) 2 % IJ SOLN
INTRAMUSCULAR | Status: AC
  Filled 2022-12-12: qty 5

## 2022-12-12 MED ORDER — ONDANSETRON HCL 4 MG/2ML IJ SOLN: 4.0000 mg | Freq: Once | INTRAMUSCULAR | Status: DC | PRN

## 2022-12-12 MED ORDER — SODIUM CHLORIDE 0.9 % IV SOLN
2.0000 g | INTRAVENOUS | Status: AC
  Administered 2022-12-12: 2 g via INTRAVENOUS
  Filled 2022-12-12: qty 20

## 2022-12-12 MED ORDER — PROPOFOL 10 MG/ML IV BOLUS
INTRAVENOUS | Status: AC
  Filled 2022-12-12: qty 20

## 2022-12-12 MED ORDER — ASPIRIN 81 MG PO TBEC
81.0000 mg | DELAYED_RELEASE_TABLET | Freq: Every day | ORAL | Status: DC
  Administered 2022-12-12: 81 mg via ORAL
  Filled 2022-12-12: qty 1

## 2022-12-12 MED ORDER — PANTOPRAZOLE SODIUM 40 MG PO TBEC
40.0000 mg | DELAYED_RELEASE_TABLET | Freq: Every day | ORAL | Status: DC
  Administered 2022-12-13: 40 mg via ORAL
  Filled 2022-12-12: qty 1

## 2022-12-12 MED ORDER — MIDAZOLAM HCL 2 MG/2ML IJ SOLN
INTRAMUSCULAR | Status: AC
  Filled 2022-12-12: qty 2

## 2022-12-12 MED ORDER — HYDROMORPHONE HCL 1 MG/ML IJ SOLN: 0.5000 mg | INTRAMUSCULAR | Status: DC | PRN

## 2022-12-12 MED ORDER — DIPHENHYDRAMINE HCL 50 MG/ML IJ SOLN: 12.5000 mg | Freq: Four times a day (QID) | INTRAMUSCULAR | Status: DC | PRN

## 2022-12-12 MED ORDER — LIDOCAINE HCL (PF) 2 % IJ SOLN
INTRAMUSCULAR | Status: AC
  Filled 2022-12-12: qty 10

## 2022-12-12 MED ORDER — GABAPENTIN 300 MG PO CAPS
600.0000 mg | ORAL_CAPSULE | Freq: Two times a day (BID) | ORAL | Status: DC
  Administered 2022-12-12 – 2022-12-13 (×2): 600 mg via ORAL
  Filled 2022-12-12 (×2): qty 2

## 2022-12-12 MED ORDER — SODIUM CHLORIDE 0.9 % IR SOLN
Status: DC | PRN
  Administered 2022-12-12 (×6): 3000 mL

## 2022-12-12 MED ORDER — SODIUM CHLORIDE 0.9 % IR SOLN
3000.0000 mL | Status: DC
  Administered 2022-12-12: 3000 mL

## 2022-12-12 MED ORDER — ONDANSETRON HCL 4 MG/2ML IJ SOLN: 4.0000 mg | INTRAMUSCULAR | Status: DC | PRN

## 2022-12-12 MED ORDER — PROPOFOL 10 MG/ML IV BOLUS
INTRAVENOUS | Status: DC | PRN
  Administered 2022-12-12: 150 mg via INTRAVENOUS
  Administered 2022-12-12: 50 mg via INTRAVENOUS

## 2022-12-12 MED ORDER — BRIMONIDINE TARTRATE 0.025 % OP SOLN: 1.0000 [drp] | Freq: Every day | OPHTHALMIC | Status: DC | PRN

## 2022-12-12 MED ORDER — MUPIROCIN 2 % EX OINT
1.0000 | TOPICAL_OINTMENT | Freq: Two times a day (BID) | CUTANEOUS | Status: DC
  Administered 2022-12-12 – 2022-12-13 (×2): 1 via NASAL

## 2022-12-12 MED ORDER — OXYBUTYNIN CHLORIDE 5 MG PO TABS: 5.0000 mg | ORAL_TABLET | Freq: Three times a day (TID) | ORAL | Status: DC | PRN

## 2022-12-12 MED ORDER — DIPHENHYDRAMINE HCL 12.5 MG/5ML PO ELIX: 12.5000 mg | ORAL_SOLUTION | Freq: Four times a day (QID) | ORAL | Status: DC | PRN

## 2022-12-12 MED ORDER — ORAL CARE MOUTH RINSE: 15.0000 mL | Freq: Once | OROMUCOSAL | Status: AC

## 2022-12-12 MED ORDER — OXYCODONE HCL 5 MG PO TABS
5.0000 mg | ORAL_TABLET | ORAL | Status: DC | PRN
  Administered 2022-12-13: 5 mg via ORAL
  Filled 2022-12-12: qty 1

## 2022-12-12 MED ORDER — FENTANYL CITRATE (PF) 100 MCG/2ML IJ SOLN
INTRAMUSCULAR | Status: DC | PRN
  Administered 2022-12-12: 50 ug via INTRAVENOUS

## 2022-12-12 SURGICAL SUPPLY — 25 items
BAG DRAIN URO TABLE W/ADPT NS (BAG) ×1 IMPLANT
BAG DRN 8 ADPR NS SKTRN CSTL (BAG) ×1
BAG DRN URN TUBE DRIP CHMBR (OSTOMY) ×1
BAG URINE DRAIN TURP 4L (OSTOMY) ×1 IMPLANT
CATH FOLEY 3WAY 30CC 22F (CATHETERS) ×1 IMPLANT
CATH FOLEY 3WAY 30CC 22FR (CATHETERS) IMPLANT
CLOTH BEACON ORANGE TIMEOUT ST (SAFETY) ×1 IMPLANT
ELECT REM PT RETURN 9FT ADLT (ELECTROSURGICAL) ×1
ELECTRODE REM PT RTRN 9FT ADLT (ELECTROSURGICAL) ×1 IMPLANT
GLOVE BIO SURGEON STRL SZ8 (GLOVE) ×1 IMPLANT
GLOVE BIOGEL PI IND STRL 7.0 (GLOVE) ×2 IMPLANT
GOWN STRL REUS W/TWL LRG LVL3 (GOWN DISPOSABLE) ×1 IMPLANT
GOWN STRL REUS W/TWL XL LVL3 (GOWN DISPOSABLE) ×1 IMPLANT
IV NS IRRIG 3000ML ARTHROMATIC (IV SOLUTION) ×4 IMPLANT
KIT TURNOVER CYSTO (KITS) ×1 IMPLANT
LOOP CUT BIPOLAR 24F LRG (ELECTROSURGICAL) ×1 IMPLANT
MANIFOLD NEPTUNE II (INSTRUMENTS) ×1 IMPLANT
PACK CYSTO (CUSTOM PROCEDURE TRAY) ×1 IMPLANT
PAD ARMBOARD 7.5X6 YLW CONV (MISCELLANEOUS) ×1 IMPLANT
POSITIONER HEAD 8X9X4 ADT (SOFTGOODS) ×1 IMPLANT
SYR 30ML LL (SYRINGE) ×1 IMPLANT
SYR TOOMEY IRRIG 70ML (MISCELLANEOUS) ×1
SYRINGE TOOMEY IRRIG 70ML (MISCELLANEOUS) ×1 IMPLANT
TOWEL OR 17X26 4PK STRL BLUE (TOWEL DISPOSABLE) ×1 IMPLANT
WATER STERILE IRR 500ML POUR (IV SOLUTION) ×1 IMPLANT

## 2022-12-12 NOTE — Anesthesia Procedure Notes (Signed)
Procedure Name: LMA Insertion Date/Time: 12/12/2022 2:19 PM  Performed by: Moshe Salisbury, CRNAPre-anesthesia Checklist: Patient identified, Patient being monitored, Emergency Drugs available, Timeout performed and Suction available Patient Re-evaluated:Patient Re-evaluated prior to induction Oxygen Delivery Method: Circle System Utilized Preoxygenation: Pre-oxygenation with 100% oxygen Induction Type: IV induction Ventilation: Mask ventilation without difficulty LMA: LMA inserted LMA Size: 4.0 Number of attempts: 1 Placement Confirmation: positive ETCO2 and breath sounds checked- equal and bilateral

## 2022-12-12 NOTE — Plan of Care (Signed)
  Problem: Education: Goal: Knowledge of the prescribed therapeutic regimen will improve Outcome: Progressing   Problem: Bowel/Gastric: Goal: Gastrointestinal status for postoperative course will improve Outcome: Progressing   Problem: Bowel/Gastric: Goal: Gastrointestinal status for postoperative course will improve Outcome: Progressing   Problem: Health Behavior/Discharge Planning: Goal: Identification of resources available to assist in meeting health care needs will improve Outcome: Progressing   Problem: Skin Integrity: Goal: Demonstration of wound healing without infection will improve Outcome: Progressing   Problem: Urinary Elimination: Goal: Ability to avoid or minimize complications of infection will improve Outcome: Progressing   Problem: Education: Goal: Knowledge of General Education information will improve Description: Including pain rating scale, medication(s)/side effects and non-pharmacologic comfort measures Outcome: Progressing

## 2022-12-12 NOTE — Op Note (Signed)
Preoperative diagnosis: BPH  Postoperative diagnosis: BPH  Procedure: 1 cystoscopy 2. Transurethral resection of the prostate  Attending: [name]  Anesthesia: General  Estimated blood loss: Minimal  Drains: 22 French foley  Specimens: 1. Prostate Chips  Antibiotics: Rocephin  Findings: Trilobar prostate enlargement. Ureteral orifices in normal anatomic location.   Indications: Patient is a 67 year old male with a history of BPH and elevated PVR.  After discussing treatment options, they decided proceed with transurethral resection of the prostate.  Procedure in detail: The patient was brought to the operating room and a brief timeout was done to ensure correct patient, correct procedure, correct site.  General anesthesia was administered patient was placed in dorsal lithotomy position.  Their genitalia was then prepped and draped in usual sterile fashion.  A rigid 22 French cystoscope was passed in the urethra and the bladder.  Bladder was inspected and we noted no masses or lesions.  the ureteral orifices were in the normal orthotopic locations. removed the cystoscope and placed a resectoscope into the bladder. We then turned our attention to the prostate resection. Using the bipolar resectoscope we resected the median lobe first from the bladder neck to the verumontanum. We then started at the 12 oclock position on the left lobe and resection to the 6 o'clock position from the bladder neck to the verumontanum. We then did the same resection of the right lobe. Once the resection was complete we then cauterized individual bleeders. We then removed the prostate chips and sent them for pathology.  We then re-inspected the prostatic fossa and found no residual bleeding.  the bladder was then drained, a 22 French foley was placed and this concluded the procedure which was well tolerated by patient.  Complications: None  Condition: Stable, extubated, transferred to PACU  Plan:  Patient is admitted overnight with continuous bladder irrigation. If their urine is clear tomorrow they will be discharged home and followup in 5 days for foley catheter removal and pathology discussion.

## 2022-12-12 NOTE — Anesthesia Postprocedure Evaluation (Signed)
Anesthesia Post Note  Patient: Tony Patterson  Procedure(s) Performed: TRANSURETHRAL RESECTION OF THE PROSTATE (TURP) (Prostate)  Patient location during evaluation: Phase II Anesthesia Type: General Level of consciousness: awake and alert and oriented Pain management: pain level controlled Vital Signs Assessment: post-procedure vital signs reviewed and stable Respiratory status: spontaneous breathing, nonlabored ventilation and respiratory function stable Cardiovascular status: blood pressure returned to baseline and stable Postop Assessment: no apparent nausea or vomiting Anesthetic complications: no  No notable events documented.   Last Vitals:  Vitals:   12/12/22 1600 12/12/22 1631  BP: 129/84 (!) 150/82  Pulse: (!) 51 (!) 54  Resp: 11 13  Temp:  36.4 C  SpO2: 100% 97%    Last Pain:  Vitals:   12/12/22 1640  TempSrc:   PainSc: 6                  Querida Beretta C Leocadia Idleman

## 2022-12-12 NOTE — H&P (Signed)
HPI: [name] is a 67yo here for TURP for BPH with incomplete emptying. He remains on flomax 0.4mg  1x2 per day. Urine stream is fair. He was initially doing well after urolift but his stream is weaker over the past year.       PMH:     Past Medical History:  Diagnosis Date   Alcohol abuse     Anemia     Anxiety     Hypertension     Neuropathy     Neuropathy     Seizures (HCC)      unknown etiology and on no meds; been 4 years since seizure.   Sleep apnea            Surgical History:      Past Surgical History:  Procedure Laterality Date   COLONOSCOPY   05/18/2007    [id number] hemorrhoids, a single anal papilla, otherwise normal/ Single polyp, as described above, removed    COLONOSCOPY WITH PROPOFOL N/A 03/16/2015    Procedure: COLONOSCOPY WITH PROPOFOL;  Surgeon: Corbin Ade, MD;  Location: [facility];  Service: Endoscopy;  Laterality: N/A;  in cecum at 1344. withdrawal time    CYSTOSCOPY WITH INSERTION OF UROLIFT N/A 03/05/2021    Procedure: CYSTOSCOPY WITH INSERTION OF UROLIFT;  Surgeon: Malen Gauze, MD;  Location: [facility];  Service: Urology;  Laterality: N/A;   THROAT SURGERY        polyps removed x2          Home Medications:  Allergies as of 10/30/2022         Reactions    Bee Venom Anaphylaxis    Penicillins Other (See Comments)    Childhood allergic reaction - no other information available            Medication List           Accurate as of Oct 30, 2022 11:42 AM. If you have any questions, ask your nurse or doctor.              acetaminophen 500 MG tablet Commonly known as: TYLENOL Take 1,000 mg by mouth every 6 (six) hours as needed for moderate pain.    aspirin EC 81 MG tablet Take 81 mg by mouth daily.    atorvastatin 80 MG tablet Commonly known as: LIPITOR Take 40 mg by mouth 2 (two) times daily.    CLEAR EYES OP Place 1 drop into both eyes daily.    EPINEPHrine 0.3 mg/0.3 mL Soaj injection Commonly known as:  EPI-PEN Inject 0.3 mg into the muscle as needed for anaphylaxis.    gabapentin 600 MG tablet Commonly known as: NEURONTIN TAKE TWO TABLETS (1200MG ) BY MOUTH THREETIMES A DAY AS NEEDED What changed: See the new instructions.    LORazepam 0.5 MG tablet Commonly known as: ATIVAN Take 0.5 mg by mouth daily as needed for anxiety.    magnesium oxide 400 MG tablet Commonly known as: MAG-OX Take 400 mg by mouth daily.    metoprolol tartrate 50 MG tablet Commonly known as: LOPRESSOR Take 50 mg by mouth 2 (two) times daily.    multivitamin with minerals Tabs tablet Take 1 tablet by mouth daily.    omeprazole 20 MG capsule Commonly known as: PRILOSEC Take 20 mg by mouth daily.    sildenafil 100 MG tablet Commonly known as: VIAGRA Take 100 mg by mouth as needed for erectile dysfunction.    tamsulosin 0.4 MG Caps capsule Commonly known as: FLOMAX Take 1  capsule (0.4 mg total) by mouth 2 (two) times daily.    Veltassa 8.4 g packet Generic drug: patiromer Take by mouth.    Vitamin D 50 MCG (2000 UT) tablet Take 2,000 Units by mouth daily.             Allergies:  Allergies       Allergies  Allergen Reactions   Bee Venom Anaphylaxis   Penicillins Other (See Comments)      Childhood allergic reaction - no other information available        Family History:      Family History  Problem Relation Age of Onset   Fibromyalgia Mother     Dementia Mother     Congestive Heart Failure Mother     Congestive Heart Failure Father     Coronary artery disease Father 26        CABG 3 different times   Hypertension Brother     CAD Brother 46   Hypertension Brother     CAD Brother 11   Neuropathy Neg Hx     Colon cancer Neg Hx            Social History:  reports that he quit smoking about 27 years ago. His smoking use included cigarettes. He has a 10.00 pack-year smoking history. He has never used smokeless tobacco. He reports that he does not currently use alcohol. He  reports that he does not use drugs.   ROS: All other review of systems were reviewed and are negative except what is noted above in HPI   Physical Exam: BP 122/75   Pulse (!) 58   Constitutional:  Alert and oriented, No acute distress. HEENT: [place] AT, moist mucus membranes.  Trachea midline, no masses. Cardiovascular: No clubbing, cyanosis, or edema. Respiratory: Normal respiratory effort, no increased work of breathing. GI: Abdomen is soft, nontender, nondistended, no abdominal masses GU: No CVA tenderness.  Lymph: No cervical or inguinal lymphadenopathy. Skin: No rashes, bruises or suspicious lesions. Neurologic: Grossly intact, no focal deficits, moving all 4 extremities. Psychiatric: Normal mood and affect.   Laboratory Data: Recent Labs       Lab Results  Component Value Date    WBC 6.6 03/01/2021    HGB 13.5 03/01/2021    HCT 42.3 03/01/2021    MCV 92.6 03/01/2021    PLT 224 03/01/2021        Recent Labs       Lab Results  Component Value Date    CREATININE 1.82 (H) 03/01/2021        Recent Labs  No results found for: "PSA"     Recent Labs  No results found for: "TESTOSTERONE"     Recent Labs       Lab Results  Component Value Date    HGBA1C 5.6 08/24/2015        Urinalysis Labs (Brief)          Component Value Date/Time    COLORURINE YELLOW 04/12/2016 1046    APPEARANCEUR Clear 10/31/2021 1102    LABSPEC 1.025 04/12/2016 1046    PHURINE 6.5 04/12/2016 1046    GLUCOSEU Negative 10/31/2021 1102    HGBUR LARGE (A) 04/12/2016 1046    BILIRUBINUR Negative 10/31/2021 1102    KETONESUR 15 (A) 04/12/2016 1046    PROTEINUR 2+ (A) 10/31/2021 1102    PROTEINUR >300 (A) 04/12/2016 1046    UROBILINOGEN 0.2 05/06/2013 1015    NITRITE Negative 10/31/2021 1102    NITRITE  NEGATIVE 04/12/2016 1046    LEUKOCYTESUR Negative 10/31/2021 1102        Recent Labs       Lab Results  Component Value Date    LABMICR See below: 10/31/2021    WBCUA None seen  10/31/2021    LABEPIT None seen 10/31/2021    MUCUS Present 10/31/2021    BACTERIA None seen 10/31/2021        Pertinent Imaging:   No results found for this or any previous visit.   No results found for this or any previous visit.   No results found for this or any previous visit.   No results found for this or any previous visit.   No results found for this or any previous visit.   No valid procedures specified. No results found for this or any previous visit.   No results found for this or any previous visit.     Assessment & Plan:     1. Benign prostatic hyperplasia with urinary obstruction We discussed the management of his BPH including continued medical therapy, Rezum, Urolift, TURP and simple prostatectomy. After discussing the options the patient has elected to proceed with TURP. Risks/benefits/alternatives discussed.

## 2022-12-12 NOTE — Transfer of Care (Signed)
Immediate Anesthesia Transfer of Care Note  Patient: Tony Patterson  Procedure(s) Performed: TRANSURETHRAL RESECTION OF THE PROSTATE (TURP) (Prostate)  Patient Location: PACU  Anesthesia Type:General  Level of Consciousness: awake and alert   Airway & Oxygen Therapy: Patient Spontanous Breathing  Post-op Assessment: Report given to RN and Post -op Vital signs reviewed and stable  Post vital signs: Reviewed and stable  Last Vitals:  Vitals Value Taken Time  BP 116/70   Temp 97.4   Pulse 69 12/12/22 1510  Resp 12   SpO2 97 % 12/12/22 1510  Vitals shown include unfiled device data.  Last Pain:  Vitals:   12/12/22 1214  TempSrc: Oral  PainSc: 0-No pain         Complications: No notable events documented.

## 2022-12-12 NOTE — Anesthesia Preprocedure Evaluation (Signed)
Anesthesia Evaluation  Patient identified by MRN, date of birth, ID band Patient awake    Reviewed: Allergy & Precautions, H&P , NPO status , Patient's Chart, lab work & pertinent test results  Airway Mallampati: II  TM Distance: >3 FB Neck ROM: Full    Dental  (+) Dental Advisory Given, Missing   Pulmonary sleep apnea , pneumonia, former smoker   Pulmonary exam normal breath sounds clear to auscultation       Cardiovascular hypertension, Pt. on medications Normal cardiovascular exam Rhythm:Regular Rate:Normal     Neuro/Psych Seizures - (alcohol withdrawl seizures),  PSYCHIATRIC DISORDERS Anxiety Depression     Neuromuscular disease    GI/Hepatic negative GI ROS,,,(+)     substance abuse  alcohol use  Endo/Other  negative endocrine ROS    Renal/GU Renal disease  negative genitourinary   Musculoskeletal negative musculoskeletal ROS (+)    Abdominal   Peds negative pediatric ROS (+)  Hematology  (+) Blood dyscrasia, anemia   Anesthesia Other Findings   Reproductive/Obstetrics negative OB ROS                              Anesthesia Physical Anesthesia Plan  ASA: 3  Anesthesia Plan: General   Post-op Pain Management: Dilaudid IV   Induction: Intravenous  PONV Risk Score and Plan: 4 or greater and Ondansetron, Dexamethasone and Midazolam  Airway Management Planned: LMA  Additional Equipment:   Intra-op Plan:   Post-operative Plan: Extubation in OR  Informed Consent: I have reviewed the patients History and Physical, chart, labs and discussed the procedure including the risks, benefits and alternatives for the proposed anesthesia with the patient or authorized representative who has indicated his/her understanding and acceptance.     Dental advisory given  Plan Discussed with: CRNA and Surgeon  Anesthesia Plan Comments:          Anesthesia Quick Evaluation

## 2022-12-13 ENCOUNTER — Encounter (HOSPITAL_COMMUNITY): Payer: Self-pay | Admitting: Urology

## 2022-12-13 DIAGNOSIS — N401 Enlarged prostate with lower urinary tract symptoms: Secondary | ICD-10-CM | POA: Diagnosis not present

## 2022-12-13 LAB — CBC
HCT: 42.2 % (ref 39.0–52.0)
Hemoglobin: 13.1 g/dL (ref 13.0–17.0)
MCH: 28.6 pg (ref 26.0–34.0)
MCHC: 31 g/dL (ref 30.0–36.0)
MCV: 92.1 fL (ref 80.0–100.0)
Platelets: 177 10*3/uL (ref 150–400)
RBC: 4.58 MIL/uL (ref 4.22–5.81)
RDW: 13.2 % (ref 11.5–15.5)
WBC: 8.2 10*3/uL (ref 4.0–10.5)
nRBC: 0 % (ref 0.0–0.2)

## 2022-12-13 LAB — BASIC METABOLIC PANEL
Anion gap: 7 (ref 5–15)
BUN: 27 mg/dL — ABNORMAL HIGH (ref 8–23)
CO2: 25 mmol/L (ref 22–32)
Calcium: 8.4 mg/dL — ABNORMAL LOW (ref 8.9–10.3)
Chloride: 106 mmol/L (ref 98–111)
Creatinine, Ser: 1.63 mg/dL — ABNORMAL HIGH (ref 0.61–1.24)
GFR, Estimated: 46 mL/min — ABNORMAL LOW (ref >60)
Glucose, Bld: 92 mg/dL (ref 70–99)
Potassium: 5.1 mmol/L (ref 3.5–5.1)
Sodium: 138 mmol/L (ref 135–145)

## 2022-12-13 MED ORDER — CHLORHEXIDINE GLUCONATE CLOTH 2 % EX PADS
6.0000 | MEDICATED_PAD | Freq: Every day | CUTANEOUS | Status: DC
  Administered 2022-12-13: 6 via TOPICAL

## 2022-12-13 MED ORDER — OXYCODONE HCL 5 MG PO TABS: 5.0000 mg | ORAL_TABLET | ORAL | 0 refills | Status: DC | PRN

## 2022-12-17 LAB — SURGICAL PATHOLOGY

## 2022-12-18 ENCOUNTER — Ambulatory Visit: Payer: Medicare Other | Admitting: Urology

## 2022-12-18 ENCOUNTER — Encounter: Payer: Self-pay | Admitting: Urology

## 2022-12-18 VITALS — BP 85/58 | HR 87 | Temp 100.1°F

## 2022-12-18 DIAGNOSIS — N401 Enlarged prostate with lower urinary tract symptoms: Secondary | ICD-10-CM | POA: Diagnosis not present

## 2022-12-18 DIAGNOSIS — N138 Other obstructive and reflux uropathy: Secondary | ICD-10-CM

## 2022-12-18 DIAGNOSIS — C61 Malignant neoplasm of prostate: Secondary | ICD-10-CM

## 2022-12-18 DIAGNOSIS — R339 Retention of urine, unspecified: Secondary | ICD-10-CM

## 2022-12-18 DIAGNOSIS — R3912 Poor urinary stream: Secondary | ICD-10-CM

## 2022-12-18 DIAGNOSIS — Z9079 Acquired absence of other genital organ(s): Secondary | ICD-10-CM

## 2022-12-18 DIAGNOSIS — Z09 Encounter for follow-up examination after completed treatment for conditions other than malignant neoplasm: Secondary | ICD-10-CM

## 2022-12-18 DIAGNOSIS — R351 Nocturia: Secondary | ICD-10-CM

## 2022-12-18 MED ORDER — CIPROFLOXACIN HCL 500 MG PO TABS
500.0000 mg | ORAL_TABLET | Freq: Once | ORAL | Status: AC
Start: 2022-12-18 — End: 2022-12-18
  Administered 2022-12-18: 500 mg via ORAL

## 2022-12-18 NOTE — Addendum Note (Signed)
Addended by: Grier Rocher on: 12/18/2022 11:12 AM   Modules accepted: Orders

## 2022-12-18 NOTE — Progress Notes (Signed)
Fill and Pull Catheter Removal  Patient is present today for a catheter removal.  Patient was cleaned and prepped in a sterile fashion 275 ml of sterile water/ saline was instilled into the bladder when the patient felt the urge to urinate. 30 ml of water was then drained from the balloon.  A 22 three way FR foley cath was removed from the bladder no complications were noted .  Patient as then given some time to void on their own.  Patient can void  15 ml of hematuria on their own after some time.  Patient tolerated well.  Performed by: Kennyth Lose, CMA  Follow up/ Additional notes: Keep follow up

## 2022-12-18 NOTE — Progress Notes (Signed)
Simple Catheter Placement  Due to urinary retention patient is present today for a foley cath placement.  Patient was cleaned and prepped in a sterile fashion with betadine and 2% lidocaine jelly was instilled into the urethra. A 16 FR foley catheter was inserted, urine return was noted  , urine was red in color.  The balloon was filled with 15cc of sterile water.  A leg bag was attached for drainage. Patient was also given a night bag to take home and was given instruction on how to change from one bag to another.  Patient was given instruction on proper catheter care.  Patient tolerated well, no complications were noted   Performed by: Guss Bunde, CMA  Additional notes/ Follow up: Return for voiding trial in 10 days & f/u with Dr. Ronne Binning with PVR in 3 months.

## 2022-12-18 NOTE — Progress Notes (Deleted)
Name: Tony Patterson DOB: 1956/03/09 MRN: [id number]  Diagnoses: Post-operative state  HPI: Tony Patterson presents post-operatively s/p TURP by Dr. Ronne Binning on 12/12/2022. Previously had Urolift on 03/05/2021.  Pathology:  A. PROSTATE CHIPS, TURP:  Prostatic adenocarcinoma, Gleason score 3+3=6 (grade group 1) involves <5% of the resected tissue.   Postop course: Today He reports***  He reports the catheter is draining ***.  He {Actions; denies-reports:120008} gross hematuria.  He {Actions; denies-reports:120008} flank pain. He {Actions; denies-reports:120008} abdominal pain.  He {Actions; denies-reports:120008} fevers. He {Actions; denies-reports:120008} nausea/vomiting.  He {Actions; denies-reports:120008} flank pain.  He {Actions; denies-reports:120008} abdominal pain.  ***consulted with Dr. Ronne Binning regarding new prostate cancer finding. He advised ***   Fall Screening: Do you usually have a device to assist in your mobility? {yes/no:20286} ***cane / ***walker / ***wheelchair   Medications: Current Outpatient Medications  Medication Sig Dispense Refill   acetaminophen (TYLENOL) 500 MG tablet Take 1,000 mg by mouth every 6 (six) hours as needed for moderate pain.     aspirin EC 81 MG tablet Take 81 mg by mouth at bedtime.     Brimonidine Tartrate (LUMIFY) 0.025 % SOLN Place 1 drop into both eyes daily as needed (redness).     EPINEPHrine 0.3 mg/0.3 mL IJ SOAJ injection Inject 0.3 mg into the muscle as needed for anaphylaxis.     gabapentin (NEURONTIN) 600 MG tablet TAKE TWO TABLETS (1200MG ) BY MOUTH THREETIMES A DAY AS NEEDED (Patient taking differently: Take 600 mg by mouth 2 (two) times daily.) 540 tablet 0   LORazepam (ATIVAN) 1 MG tablet Take 1 mg by mouth daily as needed for anxiety.     magnesium oxide (MAG-OX) 400 MG tablet Take 400 mg by mouth daily.     Melatonin 10 MG CAPS Take 10 mg by mouth at bedtime as needed (sleep).     metoprolol (LOPRESSOR) 50 MG tablet Take  50 mg by mouth 2 (two) times daily.      Multiple Vitamin (MULTIVITAMIN WITH MINERALS) TABS tablet Take 1 tablet by mouth daily.     Naphazoline HCl (CLEAR EYES OP) Place 1 drop into both eyes daily as needed (irritation/dry eyes).     omeprazole (PRILOSEC) 20 MG capsule Take 20 mg by mouth daily.      oxyCODONE (OXY IR/ROXICODONE) 5 MG immediate release tablet Take 1 tablet (5 mg total) by mouth every 4 (four) hours as needed for moderate pain. 30 tablet 0   Polyethyl Glycol-Propyl Glycol (SYSTANE OP) Place 1 drop into both eyes daily as needed (redness).     VELTASSA 8.4 g packet Take 8.4 g by mouth every Monday, Wednesday, and Friday.     Current Facility-Administered Medications  Medication Dose Route Frequency Provider Last Rate Last Admin   betamethasone acetate-betamethasone sodium phosphate (CELESTONE) injection 12 mg  12 mg Other Once Tyrell Antonio, MD        Allergies: Allergies  Allergen Reactions   Bee Venom Anaphylaxis   Penicillins Other (See Comments)    Childhood allergic reaction - no other information available    Past Medical History:  Diagnosis Date   Alcohol abuse    Anemia    Anxiety    Hypertension    Neuropathy    Neuropathy    Seizures (HCC)    unknown etiology and on no meds; been 4 years since seizure.   Sleep apnea    Past Surgical History:  Procedure Laterality Date   COLONOSCOPY  05/18/2007   [id number] hemorrhoids, a single  anal papilla, otherwise normal/ Single polyp, as described above, removed    COLONOSCOPY WITH PROPOFOL N/A 03/16/2015   Procedure: COLONOSCOPY WITH PROPOFOL;  Surgeon: Corbin Ade, MD;  Location: [facility];  Service: Endoscopy;  Laterality: N/A;  in cecum at 1344. withdrawal time    CYSTOSCOPY WITH INSERTION OF UROLIFT N/A 03/05/2021   Procedure: CYSTOSCOPY WITH INSERTION OF UROLIFT;  Surgeon: Malen Gauze, MD;  Location: [facility];  Service: Urology;  Laterality: N/A;   THROAT SURGERY     polyps removed x2    TRANSURETHRAL RESECTION OF PROSTATE N/A 12/12/2022   Procedure: TRANSURETHRAL RESECTION OF THE PROSTATE (TURP);  Surgeon: Malen Gauze, MD;  Location: [facility];  Service: Urology;  Laterality: N/A;   Family History  Problem Relation Age of Onset   Fibromyalgia Mother    Dementia Mother    Congestive Heart Failure Mother    Congestive Heart Failure Father    Coronary artery disease Father 77       CABG 3 different times   Hypertension Brother    CAD Brother 21   Hypertension Brother    CAD Brother 36   Neuropathy Neg Hx    Colon cancer Neg Hx    Social History   Socioeconomic History   Marital status: Legally Separated    Spouse name: Not on file   Number of children: 0   Years of education: college 1   Highest education level: Not on file  Occupational History   Occupation: Writer: [employer]    Comment: Neurosurgeon  Tobacco Use   Smoking status: Former    Current packs/day: 0.00    Average packs/day: 0.5 packs/day for 20.0 years (10.0 ttl pk-yrs)    Types: Cigarettes    Start date: 06/03/1975    Quit date: 06/03/1995    Years since quitting: 27.5   Smokeless tobacco: Never  Vaping Use   Vaping status: Never Used  Substance and Sexual Activity   Alcohol use: Not Currently    Comment: Previous alcohol abuse; none in 2 months as of 11/26/2018   Drug use: No   Sexual activity: Never    Birth control/protection: None  Other Topics Concern   Not on file  Social History Narrative   Left-handed   Caffeine use: 1 large cup of coffee in the morning, Coke (caffeine free) ocass   Social Determinants of Health   Financial Resource Strain: Not on file  Food Insecurity: No Food Insecurity (12/12/2022)   Hunger Vital Sign    Worried About Running Out of Food in the Last Year: Never true    Ran Out of Food in the Last Year: Never true  Transportation Needs: No Transportation Needs (12/12/2022)   PRAPARE - Scientist, research (physical sciences) (Medical): No    Lack of Transportation (Non-Medical): No  Physical Activity: Not on file  Stress: Not on file  Social Connections: Not on file  Intimate Partner Violence: Not At Risk (12/12/2022)   Humiliation, Afraid, Rape, and Kick questionnaire    Fear of Current or Ex-Partner: No    Emotionally Abused: No    Physically Abused: No    Sexually Abused: No    SUBJECTIVE  Review of Systems Constitutional: Patient ***denies any unintentional weight loss or change in strength lntegumentary: Patient ***denies any rashes or pruritus Eyes: Patient denies ***dry eyes ENT: Patient ***denies dry mouth Cardiovascular: Patient ***denies chest pain or syncope Respiratory: Patient ***denies  shortness of breath Gastrointestinal: Patient ***denies nausea, vomiting, constipation, or diarrhea Musculoskeletal: Patient ***denies muscle cramps or weakness Neurologic: Patient ***denies convulsions or seizures Psychiatric: Patient ***denies memory problems Allergic/Immunologic: Patient ***denies recent allergic reaction(s) Hematologic/Lymphatic: Patient denies bleeding tendencies Endocrine: Patient ***denies heat/cold intolerance  GU: As per HPI.  OBJECTIVE There were no vitals filed for this visit. There is no height or weight on file to calculate BMI.  Physical Examination  Constitutional: ***No obvious distress; patient is ***non-toxic appearing  Cardiovascular: ***No visible lower extremity edema.  Respiratory: The patient does ***not have audible wheezing/stridor; respirations do ***not appear labored  Gastrointestinal: Abdomen ***non-distended Musculoskeletal: ***Normal ROM of UEs  Neurologic: CN 2-12 grossly ***intact Psychiatric: Answered questions ***appropriately with ***normal affect   GU: ***Incision well healed, intact, with no surrounding erythema, edema, crepitus, fluctuance, drainage / discharge, warmth, significant tenderness to palpation.   UA: {Desc;  negative/positive:13464} for *** WBC/hpf, *** RBC/hpf, bacteria (***) PVR: *** ml  ASSESSMENT Benign prostatic hyperplasia with urinary obstruction  Weak urinary stream  S/P TURP  Postop check *** We reviewed the operative procedures and findings. Pre-operative symptoms are *** since the procedure. Surgical site healing *** well. Pain is*** well controlled. *** removed; patient tolerated ***well.  Will plan for follow up in *** months / ***1 year or sooner if needed. Pt verbalized understanding and agreement. All questions were answered.  PLAN Advised the following: *** ***No follow-ups on file.  No orders of the defined types were placed in this encounter.   It has been explained that the patient is to follow regularly with their PCP in addition to all other providers involved in their care and to follow instructions provided by these respective offices. Patient advised to contact urology clinic if any urologic-pertaining questions, concerns, new symptoms or problems arise in the interim period.  There are no Patient Instructions on file for this visit.  Electronically signed by:  Donnita Falls, MSN, FNP-C, CUNP 12/18/2022 8:58 AM

## 2022-12-18 NOTE — Progress Notes (Signed)
Name: Tony Patterson DOB: Aug 12, 1955 MRN: [id number]  Diagnoses: Post-operative state  HPI: [name] presents post-operatively s/p TURP by Dr. Ronne Binning on 12/12/2022. Previously had Urolift on 03/05/2021.  Pathology:  A. PROSTATE CHIPS, TURP: Prostatic adenocarcinoma, Gleason score 3+3=6 (grade group 1) involves <5% of the resected tissue.  Postop course: Today He reports that he has been having a lot of pain in his bladder. He reports the catheter has draining well with clear yellow urine; denies gross hematuria. Denies fevers or flank pain.    Fall Screening: Do you usually have a device to assist in your mobility? No   Medications: Current Outpatient Medications  Medication Sig Dispense Refill   acetaminophen (TYLENOL) 500 MG tablet Take 1,000 mg by mouth every 6 (six) hours as needed for moderate pain.     aspirin EC 81 MG tablet Take 81 mg by mouth at bedtime.     Brimonidine Tartrate (LUMIFY) 0.025 % SOLN Place 1 drop into both eyes daily as needed (redness).     EPINEPHrine 0.3 mg/0.3 mL IJ SOAJ injection Inject 0.3 mg into the muscle as needed for anaphylaxis.     gabapentin (NEURONTIN) 600 MG tablet TAKE TWO TABLETS (1200MG ) BY MOUTH THREETIMES A DAY AS NEEDED (Patient taking differently: Take 600 mg by mouth 2 (two) times daily.) 540 tablet 0   LORazepam (ATIVAN) 1 MG tablet Take 1 mg by mouth daily as needed for anxiety.     magnesium oxide (MAG-OX) 400 MG tablet Take 400 mg by mouth daily.     Melatonin 10 MG CAPS Take 10 mg by mouth at bedtime as needed (sleep).     metoprolol (LOPRESSOR) 50 MG tablet Take 50 mg by mouth 2 (two) times daily.      Multiple Vitamin (MULTIVITAMIN WITH MINERALS) TABS tablet Take 1 tablet by mouth daily.     Naphazoline HCl (CLEAR EYES OP) Place 1 drop into both eyes daily as needed (irritation/dry eyes).     omeprazole (PRILOSEC) 20 MG capsule Take 20 mg by mouth daily.      oxyCODONE (OXY IR/ROXICODONE) 5 MG immediate release tablet  Take 1 tablet (5 mg total) by mouth every 4 (four) hours as needed for moderate pain. 30 tablet 0   Polyethyl Glycol-Propyl Glycol (SYSTANE OP) Place 1 drop into both eyes daily as needed (redness).     VELTASSA 8.4 g packet Take 8.4 g by mouth every Monday, Wednesday, and Friday.     Current Facility-Administered Medications  Medication Dose Route Frequency Provider Last Rate Last Admin   betamethasone acetate-betamethasone sodium phosphate (CELESTONE) injection 12 mg  12 mg Other Once Tyrell Antonio, MD        Allergies: Allergies  Allergen Reactions   Bee Venom Anaphylaxis   Penicillins Other (See Comments)    Childhood allergic reaction - no other information available    Past Medical History:  Diagnosis Date   Alcohol abuse    Anemia    Anxiety    Hypertension    Neuropathy    Neuropathy    Seizures (HCC)    unknown etiology and on no meds; been 4 years since seizure.   Sleep apnea    Past Surgical History:  Procedure Laterality Date   COLONOSCOPY  05/18/2007   NWG:[id number] hemorrhoids, a single anal papilla, otherwise normal/ Single polyp, as described above, removed    COLONOSCOPY WITH PROPOFOL N/A 03/16/2015   Procedure: COLONOSCOPY WITH PROPOFOL;  Surgeon: Corbin Ade, MD;  Location: [facility];  Service: Endoscopy;  Laterality: N/A;  in cecum at 1344. withdrawal time    CYSTOSCOPY WITH INSERTION OF UROLIFT N/A 03/05/2021   Procedure: CYSTOSCOPY WITH INSERTION OF UROLIFT;  Surgeon: Malen Gauze, MD;  Location: [facility];  Service: Urology;  Laterality: N/A;   THROAT SURGERY     polyps removed x2   TRANSURETHRAL RESECTION OF PROSTATE N/A 12/12/2022   Procedure: TRANSURETHRAL RESECTION OF THE PROSTATE (TURP);  Surgeon: Malen Gauze, MD;  Location: [facility];  Service: Urology;  Laterality: N/A;   Family History  Problem Relation Age of Onset   Fibromyalgia Mother    Dementia Mother    Congestive Heart Failure Mother    Congestive Heart Failure Father     Coronary artery disease Father 55       CABG 3 different times   Hypertension Brother    CAD Brother 26   Hypertension Brother    CAD Brother 64   Neuropathy Neg Hx    Colon cancer Neg Hx    Social History   Socioeconomic History   Marital status: Legally Separated    Spouse name: Not on file   Number of children: 0   Years of education: college 1   Highest education level: Not on file  Occupational History   Occupation: Writer: [employer]    Comment: Neurosurgeon  Tobacco Use   Smoking status: Former    Current packs/day: 0.00    Average packs/day: 0.5 packs/day for 20.0 years (10.0 ttl pk-yrs)    Types: Cigarettes    Start date: 06/03/1975    Quit date: 06/03/1995    Years since quitting: 27.5   Smokeless tobacco: Never  Vaping Use   Vaping status: Never Used  Substance and Sexual Activity   Alcohol use: Not Currently    Comment: Previous alcohol abuse; none in 2 months as of 11/26/2018   Drug use: No   Sexual activity: Never    Birth control/protection: None  Other Topics Concern   Not on file  Social History Narrative   Left-handed   Caffeine use: 1 large cup of coffee in the morning, Coke (caffeine free) ocass   Social Determinants of Health   Financial Resource Strain: Not on file  Food Insecurity: No Food Insecurity (12/12/2022)   Hunger Vital Sign    Worried About Running Out of Food in the Last Year: Never true    Ran Out of Food in the Last Year: Never true  Transportation Needs: No Transportation Needs (12/12/2022)   PRAPARE - Administrator, Civil Service (Medical): No    Lack of Transportation (Non-Medical): No  Physical Activity: Not on file  Stress: Not on file  Social Connections: Not on file  Intimate Partner Violence: Not At Risk (12/12/2022)   Humiliation, Afraid, Rape, and Kick questionnaire    Fear of Current or Ex-Partner: No    Emotionally Abused: No    Physically Abused: No    Sexually Abused:  No    SUBJECTIVE  Review of Systems Constitutional: Patient denies any unintentional weight loss or change in strength lntegumentary: Patient denies any rashes or pruritus Cardiovascular: Patient denies chest pain or syncope Respiratory: Patient denies shortness of breath Gastrointestinal: Patient denies nausea, vomiting, constipation, or diarrhea Musculoskeletal: Patient denies muscle cramps or weakness Neurologic: Patient denies convulsions or seizures Psychiatric: Patient denies memory problems Allergic/Immunologic: Patient denies recent allergic reaction(s) Hematologic/Lymphatic: Patient denies bleeding tendencies Endocrine: Patient denies heat/cold intolerance  GU: As per HPI.  OBJECTIVE Vitals:   12/18/22 1001  BP: (!) 85/58  Pulse: 87  Temp: 100.1 F (37.8 C)   There is no height or weight on file to calculate BMI.  Physical Examination  Constitutional: No obvious distress; patient is non-toxic appearing  Cardiovascular: No visible lower extremity edema.  Respiratory: The patient does not have audible wheezing/stridor; respirations do not appear labored  Gastrointestinal: Abdomen non-distended Musculoskeletal: Normal ROM of UEs  Neurologic: CN 2-12 grossly intact Psychiatric: Answered questions appropriately with normal affect    ASSESSMENT Benign prostatic hyperplasia with urinary obstruction - Plan: ciprofloxacin (CIPRO) tablet 500 mg, Bladder Voiding Trial  Nocturia - Plan: ciprofloxacin (CIPRO) tablet 500 mg, Bladder Voiding Trial  Weak urinary stream - Plan: ciprofloxacin (CIPRO) tablet 500 mg, Bladder Voiding Trial  Urinary retention  Postop check  S/P TURP  Malignant neoplasm of prostate (HCC) - Plan: PSA  We reviewed the operative procedures and findings. We discussed his pathology results which showed evidence of low volume prostate cancer. Consulted with Dr. Ronne Binning regarding new prostate cancer finding. He advised active surveillance with PSA  in 3 months.  Failed voiding trial today. He agreed to proceed with reinsertion of indwelling Foley catheter and repeat voiding trial in 10 days.   Pt verbalized understanding and agreement. All questions were answered.  PLAN Advised the following: Foley catheter replaced. Return for voiding trial in 10 days & f/u with Dr. Ronne Binning with [facility] in 3 months.  Orders Placed This Encounter  Procedures   PSA    Standing Status:   Future    Standing Expiration Date:   12/18/2023   Bladder Voiding Trial    It has been explained that the patient is to follow regularly with their PCP in addition to all other providers involved in their care and to follow instructions provided by these respective offices. Patient advised to contact urology clinic if any urologic-pertaining questions, concerns, new symptoms or problems arise in the interim period.  There are no Patient Instructions on file for this visit.  Electronically signed by:  Donnita Falls, MSN, FNP-C, CUNP 12/18/2022 11:08 AM

## 2022-12-19 ENCOUNTER — Emergency Department (HOSPITAL_COMMUNITY): Payer: Medicare Other

## 2022-12-19 ENCOUNTER — Other Ambulatory Visit: Payer: Self-pay

## 2022-12-19 ENCOUNTER — Encounter (HOSPITAL_COMMUNITY): Payer: Self-pay | Admitting: Emergency Medicine

## 2022-12-19 ENCOUNTER — Encounter: Payer: Medicare Other | Admitting: Urology

## 2022-12-19 ENCOUNTER — Emergency Department (HOSPITAL_COMMUNITY)
Admission: EM | Admit: 2022-12-19 | Discharge: 2022-12-20 | Disposition: A | Discharge status: Home / Self Care | Payer: Medicare Other | Attending: Emergency Medicine | Admitting: Emergency Medicine

## 2022-12-19 DIAGNOSIS — R404 Transient alteration of awareness: Secondary | ICD-10-CM | POA: Insufficient documentation

## 2022-12-19 DIAGNOSIS — D649 Anemia, unspecified: Secondary | ICD-10-CM | POA: Diagnosis not present

## 2022-12-19 DIAGNOSIS — E871 Hypo-osmolality and hyponatremia: Secondary | ICD-10-CM | POA: Insufficient documentation

## 2022-12-19 DIAGNOSIS — R4182 Altered mental status, unspecified: Secondary | ICD-10-CM | POA: Diagnosis present

## 2022-12-19 DIAGNOSIS — Z7982 Long term (current) use of aspirin: Secondary | ICD-10-CM | POA: Insufficient documentation

## 2022-12-19 LAB — COMPREHENSIVE METABOLIC PANEL
ALT: 12 U/L (ref 0–44)
AST: 15 U/L (ref 15–41)
Albumin: 3.4 g/dL — ABNORMAL LOW (ref 3.5–5.0)
Alkaline Phosphatase: 71 U/L (ref 38–126)
Anion gap: 5 (ref 5–15)
BUN: 26 mg/dL — ABNORMAL HIGH (ref 8–23)
CO2: 30 mmol/L (ref 22–32)
Calcium: 8.4 mg/dL — ABNORMAL LOW (ref 8.9–10.3)
Chloride: 96 mmol/L — ABNORMAL LOW (ref 98–111)
Creatinine, Ser: 1.96 mg/dL — ABNORMAL HIGH (ref 0.61–1.24)
GFR, Estimated: 37 mL/min — ABNORMAL LOW (ref >60)
Glucose, Bld: 109 mg/dL — ABNORMAL HIGH (ref 70–99)
Potassium: 4.4 mmol/L (ref 3.5–5.1)
Sodium: 131 mmol/L — ABNORMAL LOW (ref 135–145)
Total Bilirubin: 0.6 mg/dL (ref 0.3–1.2)
Total Protein: 6.8 g/dL (ref 6.5–8.1)

## 2022-12-19 LAB — CBC WITH DIFFERENTIAL/PLATELET
Abs Immature Granulocytes: 0.02 10*3/uL (ref 0.00–0.07)
Basophils Absolute: 0 10*3/uL (ref 0.0–0.1)
Basophils Relative: 0 %
Eosinophils Absolute: 0.2 10*3/uL (ref 0.0–0.5)
Eosinophils Relative: 4 %
HCT: 36.1 % — ABNORMAL LOW (ref 39.0–52.0)
Hemoglobin: 11.5 g/dL — ABNORMAL LOW (ref 13.0–17.0)
Immature Granulocytes: 0 %
Lymphocytes Relative: 27 %
Lymphs Abs: 1.5 10*3/uL (ref 0.7–4.0)
MCH: 28.5 pg (ref 26.0–34.0)
MCHC: 31.9 g/dL (ref 30.0–36.0)
MCV: 89.6 fL (ref 80.0–100.0)
Monocytes Absolute: 0.5 10*3/uL (ref 0.1–1.0)
Monocytes Relative: 8 %
Neutro Abs: 3.4 10*3/uL (ref 1.7–7.7)
Neutrophils Relative %: 61 %
Platelets: 216 10*3/uL (ref 150–400)
RBC: 4.03 MIL/uL — ABNORMAL LOW (ref 4.22–5.81)
RDW: 12.8 % (ref 11.5–15.5)
WBC: 5.7 10*3/uL (ref 4.0–10.5)
nRBC: 0 % (ref 0.0–0.2)

## 2022-12-19 LAB — LACTIC ACID, PLASMA: Lactic Acid, Venous: 1 mmol/L (ref 0.5–1.9)

## 2022-12-19 LAB — TROPONIN I (HIGH SENSITIVITY): Troponin I (High Sensitivity): 4 ng/L (ref <18)

## 2022-12-19 LAB — CBG MONITORING, ED: Glucose-Capillary: 114 mg/dL — ABNORMAL HIGH (ref 70–99)

## 2022-12-19 LAB — AMMONIA: Ammonia: <10 umol/L (ref 9–35)

## 2022-12-19 LAB — CK: Total CK: 54 U/L (ref 49–397)

## 2022-12-19 LAB — ETHANOL: Alcohol, Ethyl (B): <10 mg/dL (ref <10)

## 2022-12-19 NOTE — ED Triage Notes (Addendum)
Pt in with AMS, arrives with wife who states pt had a TURP procedure 1 wk ago with Dr. Ronne Binning. Per wife, he had an office visit with him yesterday, and they removed foley but he was unable to void, so they placed a new catheter 7/17 in office. Temp 98.1, pt denies any pain. Pt states he has not drank ETOH in 2 yrs, states last Oxy was this afternoon sometime

## 2022-12-19 NOTE — ED Provider Notes (Signed)
EMERGENCY DEPARTMENT AT [facility] Provider Note   CSN: [id number] Arrival date & time: 12/19/22  2118     History  Chief Complaint  Patient presents with   Altered Mental Status    Tony Patterson is a 67 y.o. male brought into the emergency department for altered mental status by his wife.  Patient's wife states that they are currently separated so she has not been with him however he apparently had to have a procedure to his prostate earlier this week.  He had a voiding trial yesterday and failed it and had his catheter replaced.  She reports that he texted her and the text made no sense so she came over to check on him.  She found him asleep on the porch in the heat.  When he woke up he was extremely confused asking her if she was going to get her "golf dog."  She states that he did not know where he was and seemed extremely confused.  She states that he does have sleep apnea was not wearing his CPAP but she thinks he might of also been overheated.  He has also been confused here in the emergency department apartment.  The patient has not had any fevers.  He has been sober for the past 2 years per the patient and his wife.  He is taking oxycodone for pain at this time due to the procedure.  He told his wife earlier that he did take it today.   Altered Mental Status      Home Medications Prior to Admission medications   Medication Sig Start Date End Date Taking? Authorizing Provider  acetaminophen (TYLENOL) 500 MG tablet Take 1,000 mg by mouth every 6 (six) hours as needed for moderate pain.    [provider]  aspirin EC 81 MG tablet Take 81 mg by mouth at bedtime.    [provider]  Brimonidine Tartrate (LUMIFY) 0.025 % SOLN Place 1 drop into both eyes daily as needed (redness).    [provider]  EPINEPHrine 0.3 mg/0.3 mL IJ SOAJ injection Inject 0.3 mg into the muscle as needed for anaphylaxis.    [provider]   gabapentin (NEURONTIN) 600 MG tablet TAKE TWO TABLETS (1200MG ) BY MOUTH THREETIMES A DAY AS NEEDED Patient taking differently: Take 600 mg by mouth 2 (two) times daily. 08/01/20   Anson Fret, MD  LORazepam (ATIVAN) 1 MG tablet Take 1 mg by mouth daily as needed for anxiety.    [provider]  magnesium oxide (MAG-OX) 400 MG tablet Take 400 mg by mouth daily.    [provider]  Melatonin 10 MG CAPS Take 10 mg by mouth at bedtime as needed (sleep).    [provider]  metoprolol (LOPRESSOR) 50 MG tablet Take 50 mg by mouth 2 (two) times daily.  10/09/11   Carylon Perches, MD  Multiple Vitamin (MULTIVITAMIN WITH MINERALS) TABS tablet Take 1 tablet by mouth daily. 04/18/16   Ghimire, Werner Lean, MD  Naphazoline HCl (CLEAR EYES OP) Place 1 drop into both eyes daily as needed (irritation/dry eyes).    [provider]  omeprazole (PRILOSEC) 20 MG capsule Take 20 mg by mouth daily.     [provider]  oxyCODONE (OXY IR/ROXICODONE) 5 MG immediate release tablet Take 1 tablet (5 mg total) by mouth every 4 (four) hours as needed for moderate pain. 12/13/22   McKenzie, Mardene Celeste, MD  Polyethyl Glycol-Propyl Glycol (SYSTANE OP) Place  1 drop into both eyes daily as needed (redness).    [provider]  VELTASSA 8.4 g packet Take 8.4 g by mouth every Monday, Wednesday, and Friday. 10/25/22   [provider]      Allergies    Bee venom and Penicillins    Review of Systems   Review of Systems  Physical Exam Updated Vital Signs BP 138/87   Pulse 60   Temp 98 F (36.7 C)   Resp 10   Wt 115.2 kg   SpO2 99%   BMI 31.74 kg/m  Physical Exam Vitals and nursing note reviewed.  Constitutional:      General: He is not in acute distress.    Appearance: He is well-developed. He is not diaphoretic.  HENT:     Head: Normocephalic and atraumatic.     Mouth/Throat:     Mouth: Oropharynx is clear and moist.  Eyes:     General: No scleral icterus.     Extraocular Movements: Extraocular movements intact and EOM normal.     Conjunctiva/sclera: Conjunctivae normal.     Pupils: Pupils are equal, round, and reactive to light.     Comments: No horizontal, vertical or rotational nystagmus  Neck:     Comments: Full active and passive ROM without pain No midline or paraspinal tenderness No nuchal rigidity or meningeal signs Cardiovascular:     Rate and Rhythm: Normal rate and regular rhythm.  Pulmonary:     Effort: Pulmonary effort is normal. No respiratory distress.     Breath sounds: Normal breath sounds. No wheezing or rales.  Abdominal:     General: Bowel sounds are normal.     Palpations: Abdomen is soft.     Tenderness: There is no abdominal tenderness. There is no guarding or rebound.  Musculoskeletal:        General: Normal range of motion.     Cervical back: Normal range of motion and neck supple.  Lymphadenopathy:     Cervical: No cervical adenopathy.  Skin:    General: Skin is warm and dry.     Findings: No rash.  Neurological:     Mental Status: He is alert and oriented to person, place, and time.     Cranial Nerves: No cranial nerve deficit.     Motor: No abnormal muscle tone.     Coordination: Coordination normal.     Deep Tendon Reflexes: Reflexes are normal and symmetric.     Comments: Mental Status:  Alert, oriented, thought content appropriate. Speech fluent without evidence of aphasia. Able to follow 2 step commands without difficulty.  Cranial Nerves:  II:  Peripheral visual fields grossly normal, pupils equal, round, reactive to light III,IV, VI: ptosis not present, extra-ocular motions intact bilaterally  V,VII: smile symmetric, facial light touch sensation equal VIII: hearing grossly normal bilaterally  IX,X: midline uvula rise  XI: bilateral shoulder shrug equal and strong XII: midline tongue extension  Motor:  5/5 in upper and lower extremities bilaterally including strong and equal grip strength and  dorsiflexion/plantar flexion Sensory: Pinprick and light touch normal in all extremities.  Deep Tendon Reflexes: 2+ and symmetric  Cerebellar: normal finger-to-nose with bilateral upper extremities Gait: normal gait and balance CV: distal pulses palpable throughout   Psychiatric:        Mood and Affect: Mood and affect normal.        Behavior: Behavior normal.        Thought Content: Thought content normal.  Judgment: Judgment normal.     ED Results / Procedures / Treatments   Labs (all labs ordered are listed, but only abnormal results are displayed) Labs Reviewed  COMPREHENSIVE METABOLIC PANEL - Abnormal; Notable for the following components:      Result Value   Sodium 131 (*)    Chloride 96 (*)    Glucose, Bld 109 (*)    BUN 26 (*)    Creatinine, Ser 1.96 (*)    Calcium 8.4 (*)    Albumin 3.4 (*)    GFR, Estimated 37 (*)    All other components within normal limits  CBC WITH DIFFERENTIAL/PLATELET - Abnormal; Notable for the following components:   RBC 4.03 (*)    Hemoglobin 11.5 (*)    HCT 36.1 (*)    All other components within normal limits  CBG MONITORING, ED - Abnormal; Notable for the following components:   Glucose-Capillary 114 (*)    All other components within normal limits  CULTURE, BLOOD (ROUTINE X 2)  CULTURE, BLOOD (ROUTINE X 2)  AMMONIA  LACTIC ACID, PLASMA  ETHANOL  CK  VITAMIN B1  TROPONIN I (HIGH SENSITIVITY)    EKG EKG Interpretation Date/Time:  Thursday December 19 2022 22:29:35 EDT Ventricular Rate:  69 PR Interval:  203 QRS Duration:  71 QT Interval:  373 QTC Calculation: 400 R Axis:   53  Text Interpretation: Sinus rhythm Borderline ST elevation, lateral leads No significant change since last tracing Confirmed by Linwood Dibbles [phone]) on 12/19/2022 10:31:58 PM  Radiology CT HEAD WO CONTRAST  Result Date: 12/19/2022 CLINICAL DATA:  Altered mental status EXAM: CT HEAD WITHOUT CONTRAST TECHNIQUE: Contiguous axial images were obtained  from the base of the skull through the vertex without intravenous contrast. RADIATION DOSE REDUCTION: This exam was performed according to the departmental dose-optimization program which includes automated exposure control, adjustment of the mA and/or kV according to patient size and/or use of iterative reconstruction technique. COMPARISON:  CT brain 04/12/2016 FINDINGS: Brain: No acute territorial infarction, hemorrhage or intracranial mass. Mild atrophy. Nonenlarged ventricles. Vascular: No hyperdense vessels.  Carotid vascular calcification. Skull: Normal. Negative for fracture or focal lesion. Sinuses/Orbits: Mucosal thickening in the right maxillary sinus. Other: None. IMPRESSION: No CT evidence for acute intracranial abnormality. Mild atrophy. Right maxillary sinus disease. Electronically Signed   By: Jasmine Pang M.D.   On: 12/19/2022 22:56    Procedures Procedures    Medications Ordered in ED Medications - No data to display  ED Course/ Medical Decision Making/ A&P                             Medical Decision Making This patient presents to the ED for concern of ams, this involves an extensive number of treatment options, and is a complaint that carries with it a high risk of complications and morbidity.  The differential diagnosis for AMS is extensive and includes, but is not limited to: drug overdose - opioids, alcohol, sedatives, antipsychotics, drug withdrawal, others; Metabolic: hypoxia, hypoglycemia, hyperglycemia, hypercalcemia, hypernatremia, hyponatremia, uremia, hepatic encephalopathy, hypothyroidism, hyperthyroidism, vitamin B12 or thiamine deficiency, carbon monoxide poisoning, Wilson's disease, Lactic acidosis, DKA/HHOS; Infectious: meningitis, encephalitis, bacteremia/sepsis, urinary tract infection, pneumonia, neurosyphilis; Structural: Space-occupying lesion, (brain tumor, subdural hematoma, hydrocephalus,); Vascular: stroke, subarachnoid hemorrhage, coronary ischemia,  hypertensive encephalopathy, CNS vasculitis, thrombotic thrombocytopenic purpura, disseminated intravascular coagulation, hyperviscosity; Psychiatric: Schizophrenia, depression; Other: Seizure, hypothermia, heat stroke, ICU psychosis, dementia -"sundowning."    Co morbidities:  has a past medical history of Alcohol abuse, Anemia, Anxiety, Hypertension, Neuropathy, Neuropathy, Seizures (HCC), and Sleep apnea.   Social Determinants of Health:       SDOH Screenings Food Insecurity: No Food Insecurity (12/12/2022) Housing: Low Risk  (12/12/2022) Transportation Needs: No Transportation Needs (12/12/2022) Utilities: Not At Risk (12/12/2022) Tobacco Use: Medium Risk (12/19/2022)   Additional history:  Visual history obtained from wife at bedside.   Lab Tests:  I Ordered, and personally interpreted labs.  The pertinent results include:   CMP which shows mild hyponatremia, chronic renal insufficiency with slight elevation in creatinine.Marland Kitchen CBC with mild normocytic anemia, clinically insignificant. Ammonia lactic acid CK all within normal limits, negative ethanol and troponin.  Imaging Studies:  I ordered imaging studies including CT head I independently visualized and interpreted imaging which showed no acute findings I agree with the radiologist interpretation  Cardiac Monitoring/ECG:       The patient was maintained on a cardiac monitor.  I personally viewed and interpreted the cardiac monitored which showed an underlying rhythm of: Sinus rhythm  Medicines ordered and prescription drug management:  Reevaluation of the patient after these observation showed that the patient resolved I have reviewed the patients home medicines and have made adjustments as needed  Test Considered:       Considered MRI for acute stroke however patient's clinical exam findings and history are not consistent with stroke.  Critical Interventions:         Consultations Obtained:   Problem  List / ED Course:       (R40.4) Transient alteration of awareness  (primary encounter diagnosis)   MDM: Patient here for transient altered mental status now back to baseline.  Suspect this was either due to being just woken up out of sleep and/or due to his use of opiate narcotics for pain relief at this time.  Low suspicion for another emergent cause.  He does not appear to be encephalopathic.  He is comfortable with going home.  He appears otherwise appropriate for discharge.        Amount and/or Complexity of Data Reviewed Labs: ordered. Radiology: ordered.            Final Clinical Impression(s) / ED Diagnoses Final diagnoses:  Transient alteration of awareness    Rx / DC Orders ED Discharge Orders     None         Arthor Captain, PA-C 12/20/22 1823    Linwood Dibbles, MD 12/23/22 [phone]

## 2022-12-20 NOTE — Discharge Instructions (Signed)
Contact a health care provider if: Symptoms do not get better or they become worse. New symptoms of delirium develop. Caring for the person at home does not seem safe. Eating, drinking, or communicating stops. There are side effects of medicines, such as changes in sleep patterns, dizziness, weight gain, restlessness, movement changes, or tremors. Get help right away if: The person has thoughts of harming self or harming others. There are serious side effects of medicine, such as: Swelling of the face, lips, tongue, or throat. Fever, confusion, muscle spasms, or seizures. If you ever feel like a loved one may hurt himself or herself or others, or shares thoughts about taking his or her own life, get help right away. You can go to your nearest emergency department or: Call your local emergency services (911 in the [place] Call a suicide crisis helpline, such as the National Suicide Prevention Lifeline at 385-177-0463 or 988 in the [place] This is open 24 hours a day in the [place] Text the Crisis Text Line at 3343961528 (in the [place]

## 2022-12-21 LAB — CULTURE, BLOOD (ROUTINE X 2)
Culture: NO GROWTH
Culture: NO GROWTH

## 2022-12-23 LAB — CULTURE, BLOOD (ROUTINE X 2): Special Requests: ADEQUATE

## 2022-12-24 LAB — CULTURE, BLOOD (ROUTINE X 2)

## 2022-12-24 LAB — VITAMIN B1: Vitamin B1 (Thiamine): 150.6 nmol/L (ref 66.5–200.0)

## 2022-12-24 NOTE — Discharge Summary (Signed)
Physician Discharge Summary  Patient ID: Tony Patterson MRN: [id number] DOB/AGE: 66-Feb-1957 7 y.o.  Admit date: 12/12/2022 Discharge date: 12/13/2022  Admission Diagnoses:  BPH  Discharge Diagnoses:  BPH   Past Medical History:  Diagnosis Date   Alcohol abuse    Anemia    Anxiety    Hypertension    Neuropathy    Neuropathy    Seizures (HCC)    unknown etiology and on no meds; been 4 years since seizure.   Sleep apnea     Surgeries: Procedure(s): TRANSURETHRAL RESECTION OF THE PROSTATE (TURP) on 12/12/2022   Consultants (if any):   Discharged Condition: Improved  Hospital Course: Tony Patterson is an 67 y.o. male who was admitted 12/12/2022 with a diagnosis of <principal problem not specified> and went to the operating room on 12/12/2022 and underwent the above named procedures.    He was given perioperative antibiotics:  Anti-infectives (From admission, onward)    Start     Dose/Rate Route Frequency Ordered Stop   12/12/22 1207  cefTRIAXone (ROCEPHIN) 2 g in sodium chloride 0.9 % 100 mL IVPB        2 g 200 mL/hr over 30 Minutes Intravenous 30 min pre-op 12/12/22 1207 12/12/22 1642     .  He was given sequential compression devices, early ambulation for DVT prophylaxis.  He benefited maximally from the hospital stay and there were no complications.    Recent vital signs:  Vitals:   12/13/22 0456 12/13/22 0750  BP: (!) 152/82 (!) 143/83  Pulse: 72 75  Resp: 19 20  Temp: 98.8 F (37.1 C) 98.8 F (37.1 C)  SpO2: 100% 100%    Recent laboratory studies:  Lab Results  Component Value Date   HGB 11.5 (L) 12/19/2022   HGB 13.1 12/13/2022   HGB 13.3 12/12/2022   Lab Results  Component Value Date   WBC 5.7 12/19/2022   PLT 216 12/19/2022   Lab Results  Component Value Date   INR 1.02 04/12/2016   Lab Results  Component Value Date   NA 131 (L) 12/19/2022   K 4.4 12/19/2022   CL 96 (L) 12/19/2022   CO2 30 12/19/2022   BUN 26 (H) 12/19/2022   CREATININE  1.96 (H) 12/19/2022   GLUCOSE 109 (H) 12/19/2022    Discharge Medications:   Allergies as of 12/13/2022       Reactions   Bee Venom Anaphylaxis   Penicillins Other (See Comments)   Childhood allergic reaction - no other information available        Medication List     STOP taking these medications    tamsulosin 0.4 MG Caps capsule Commonly known as: FLOMAX       TAKE these medications    acetaminophen 500 MG tablet Commonly known as: TYLENOL Take 1,000 mg by mouth every 6 (six) hours as needed for moderate pain.   aspirin EC 81 MG tablet Take 81 mg by mouth at bedtime.   CLEAR EYES OP Place 1 drop into both eyes daily as needed (irritation/dry eyes).   EPINEPHrine 0.3 mg/0.3 mL Soaj injection Commonly known as: EPI-PEN Inject 0.3 mg into the muscle as needed for anaphylaxis.   gabapentin 600 MG tablet Commonly known as: NEURONTIN TAKE TWO TABLETS (1200MG ) BY MOUTH THREETIMES A DAY AS NEEDED What changed: See the new instructions.   LORazepam 1 MG tablet Commonly known as: ATIVAN Take 1 mg by mouth daily as needed for anxiety.   Lumify 0.025 % Soln Generic  drug: Brimonidine Tartrate Place 1 drop into both eyes daily as needed (redness).   magnesium oxide 400 MG tablet Commonly known as: MAG-OX Take 400 mg by mouth daily.   Melatonin 10 MG Caps Take 10 mg by mouth at bedtime as needed (sleep).   metoprolol tartrate 50 MG tablet Commonly known as: LOPRESSOR Take 50 mg by mouth 2 (two) times daily.   multivitamin with minerals Tabs tablet Take 1 tablet by mouth daily.   omeprazole 20 MG capsule Commonly known as: PRILOSEC Take 20 mg by mouth daily.   oxyCODONE 5 MG immediate release tablet Commonly known as: Oxy IR/ROXICODONE Take 1 tablet (5 mg total) by mouth every 4 (four) hours as needed for moderate pain.   SYSTANE OP Place 1 drop into both eyes daily as needed (redness).   Veltassa 8.4 g packet Generic drug: patiromer Take 8.4 g by  mouth every Monday, Wednesday, and Friday.        Diagnostic Studies: CT HEAD WO CONTRAST  Result Date: 12/19/2022 CLINICAL DATA:  Altered mental status EXAM: CT HEAD WITHOUT CONTRAST TECHNIQUE: Contiguous axial images were obtained from the base of the skull through the vertex without intravenous contrast. RADIATION DOSE REDUCTION: This exam was performed according to the departmental dose-optimization program which includes automated exposure control, adjustment of the mA and/or kV according to patient size and/or use of iterative reconstruction technique. COMPARISON:  CT brain 04/12/2016 FINDINGS: Brain: No acute territorial infarction, hemorrhage or intracranial mass. Mild atrophy. Nonenlarged ventricles. Vascular: No hyperdense vessels.  Carotid vascular calcification. Skull: Normal. Negative for fracture or focal lesion. Sinuses/Orbits: Mucosal thickening in the right maxillary sinus. Other: None. IMPRESSION: No CT evidence for acute intracranial abnormality. Mild atrophy. Right maxillary sinus disease. Electronically Signed   By: Jasmine Pang M.D.   On: 12/19/2022 22:56    Disposition: Discharge disposition: 01-Home or Self Care       Discharge Instructions     Discharge patient   Complete by: As directed    Discharge disposition: 01-Home or Self Care   Discharge patient date: 12/13/2022        Follow-up Information     Nikko Goldwire, Mardene Celeste, MD. Call in 1 week(s).   Specialty: Urology Contact information: [address]  [place] [address] [phone]                  Signed: [name] 12/24/2022, 10:24 AM

## 2022-12-26 ENCOUNTER — Telehealth: Payer: Self-pay | Admitting: *Deleted

## 2022-12-26 NOTE — Telephone Encounter (Signed)
Transition Care Management Follow-up Telephone Call Date of discharge and from where: [facility] 12/20/2022 How have you been since you were released from the hospital? Feeling much better  Any questions or concerns? No  Items Reviewed: Did the pt receive and understand the discharge instructions provided? Yes  Medications obtained and verified? No  Other? No  Any new allergies since your discharge? No  Dietary orders reviewed? No Do you have support at home? Yes      Follow up appointments reviewed:  PCP Hospital f/u appt confirmed? No   Patient said he is planning pcp follow up   Are transportation arrangements needed? No  If their condition worsens, is the pt aware to call PCP or go to the Emergency Dept.? Yes Was the patient provided with contact information for the PCP's office or ED? Yes Was to pt encouraged to call back with questions or concerns? Yes

## 2022-12-31 ENCOUNTER — Ambulatory Visit: Payer: Medicare Other

## 2022-12-31 DIAGNOSIS — R339 Retention of urine, unspecified: Secondary | ICD-10-CM

## 2022-12-31 MED ORDER — CIPROFLOXACIN HCL 500 MG PO TABS
500.0000 mg | ORAL_TABLET | Freq: Once | ORAL | Status: AC
Start: 2022-12-31 — End: 2022-12-31
  Administered 2022-12-31: 500 mg via ORAL

## 2022-12-31 NOTE — Progress Notes (Signed)
Fill and Pull Catheter Removal  Patient is present today for a catheter removal.  Patient was cleaned and prepped in a sterile fashion of sterile water/ saline was instilled into the bladder when the patient felt the urge to urinate. 15ml of water was then drained from the balloon.  A 16FR foley cath was removed from the bladder no complications were noted .  Patient as then given some time to void on their own.  Patient can void  on their own after some time.  Patient tolerated well.  Performed by: Guss Bunde, CMA  Follow up/ Additional notes: Follow up in 3 months with PVR per Dr. Ronne Binning

## 2023-03-19 ENCOUNTER — Ambulatory Visit: Payer: Medicare Other | Admitting: Urology

## 2023-03-19 VITALS — BP 161/85

## 2023-03-19 DIAGNOSIS — R339 Retention of urine, unspecified: Secondary | ICD-10-CM

## 2023-03-19 DIAGNOSIS — Z09 Encounter for follow-up examination after completed treatment for conditions other than malignant neoplasm: Secondary | ICD-10-CM

## 2023-03-19 DIAGNOSIS — C61 Malignant neoplasm of prostate: Secondary | ICD-10-CM | POA: Diagnosis not present

## 2023-03-19 DIAGNOSIS — Z87438 Personal history of other diseases of male genital organs: Secondary | ICD-10-CM | POA: Diagnosis not present

## 2023-03-19 DIAGNOSIS — N138 Other obstructive and reflux uropathy: Secondary | ICD-10-CM

## 2023-03-19 LAB — URINALYSIS, ROUTINE W REFLEX MICROSCOPIC
Bilirubin, UA: NEGATIVE
Glucose, UA: NEGATIVE
Ketones, UA: NEGATIVE
Leukocytes,UA: NEGATIVE
Nitrite, UA: NEGATIVE
RBC, UA: NEGATIVE
Specific Gravity, UA: 1.025 (ref 1.005–1.030)
Urobilinogen, Ur: 0.2 mg/dL (ref 0.2–1.0)
pH, UA: 6 (ref 5.0–7.5)

## 2023-03-19 LAB — MICROSCOPIC EXAMINATION
Bacteria, UA: NONE SEEN
RBC, Urine: NONE SEEN /[HPF] (ref 0–2)

## 2023-03-19 LAB — BLADDER SCAN AMB NON-IMAGING: Scan Result: 60

## 2023-03-19 NOTE — Progress Notes (Signed)
03/19/2023 1:39 PM   Tony Patterson 04/16/1956 [id number]  Referring provider: Carylon Perches, MD [address] [place],  [place] [address]  Followup prostate cancer and BPH   HPI: Tony Patterson is a 02VO here for followup for prostate cancer and BPH. PVR 60cc. IPSS 3 QOL 0 after TURP. No recent PSA. He had <5% Gleason 3+3=6 prostate cancer in TURP specimen   PMH: Past Medical History:  Diagnosis Date   Alcohol abuse    Anemia    Anxiety    Hypertension    Neuropathy    Neuropathy    Seizures (HCC)    unknown etiology and on no meds; been 4 years since seizure.   Sleep apnea     Surgical History: Past Surgical History:  Procedure Laterality Date   COLONOSCOPY  05/18/2007   [id number] hemorrhoids, a single anal papilla, otherwise normal/ Single polyp, as described above, removed    COLONOSCOPY WITH PROPOFOL N/A 03/16/2015   Procedure: COLONOSCOPY WITH PROPOFOL;  Surgeon: Corbin Ade, MD;  Location: [facility];  Service: Endoscopy;  Laterality: N/A;  in cecum at 1344. withdrawal time    CYSTOSCOPY WITH INSERTION OF UROLIFT N/A 03/05/2021   Procedure: CYSTOSCOPY WITH INSERTION OF UROLIFT;  Surgeon: Malen Gauze, MD;  Location: [facility];  Service: Urology;  Laterality: N/A;   THROAT SURGERY     polyps removed x2   TRANSURETHRAL RESECTION OF PROSTATE N/A 12/12/2022   Procedure: TRANSURETHRAL RESECTION OF THE PROSTATE (TURP);  Surgeon: Malen Gauze, MD;  Location: [facility];  Service: Urology;  Laterality: N/A;    Home Medications:  Allergies as of 03/19/2023       Reactions   Bee Venom Anaphylaxis   Penicillins Other (See Comments)   Childhood allergic reaction - no other information available        Medication List        Accurate as of March 19, 2023  1:39 PM. If you have any questions, ask your nurse or doctor.          acetaminophen 500 MG tablet Commonly known as: TYLENOL Take 1,000 mg by mouth every 6 (six) hours as needed for moderate  pain.   aspirin EC 81 MG tablet Take 81 mg by mouth at bedtime.   CLEAR EYES OP Place 1 drop into both eyes daily as needed (irritation/dry eyes).   EPINEPHrine 0.3 mg/0.3 mL Soaj injection Commonly known as: EPI-PEN Inject 0.3 mg into the muscle as needed for anaphylaxis.   gabapentin 600 MG tablet Commonly known as: NEURONTIN TAKE TWO TABLETS (1200MG ) BY MOUTH THREETIMES A DAY AS NEEDED What changed: See the new instructions.   LORazepam 1 MG tablet Commonly known as: ATIVAN Take 1 mg by mouth daily as needed for anxiety.   Lumify 0.025 % Soln Generic drug: Brimonidine Tartrate Place 1 drop into both eyes daily as needed (redness).   magnesium oxide 400 MG tablet Commonly known as: MAG-OX Take 400 mg by mouth daily.   Melatonin 10 MG Caps Take 10 mg by mouth at bedtime as needed (sleep).   metoprolol tartrate 50 MG tablet Commonly known as: LOPRESSOR Take 50 mg by mouth 2 (two) times daily.   multivitamin with minerals Tabs tablet Take 1 tablet by mouth daily.   omeprazole 20 MG capsule Commonly known as: PRILOSEC Take 20 mg by mouth daily.   oxyCODONE 5 MG immediate release tablet Commonly known as: Oxy IR/ROXICODONE Take 1 tablet (5 mg total) by mouth  every 4 (four) hours as needed for moderate pain.   SYSTANE OP Place 1 drop into both eyes daily as needed (redness).   Veltassa 8.4 g packet Generic drug: patiromer Take 8.4 g by mouth every Monday, Wednesday, and Friday.        Allergies:  Allergies  Allergen Reactions   Bee Venom Anaphylaxis   Penicillins Other (See Comments)    Childhood allergic reaction - no other information available    Family History: Family History  Problem Relation Age of Onset   Fibromyalgia Mother    Dementia Mother    Congestive Heart Failure Mother    Congestive Heart Failure Father    Coronary artery disease Father 47       CABG 3 different times   Hypertension Brother    CAD Brother 69   Hypertension  Brother    CAD Brother 62   Neuropathy Neg Hx    Colon cancer Neg Hx     Social History:  reports that he quit smoking about 27 years ago. His smoking use included cigarettes. He started smoking about 47 years ago. He has a 10 pack-year smoking history. He has never used smokeless tobacco. He reports that he does not currently use alcohol. He reports that he does not use drugs.  ROS: All other review of systems were reviewed and are negative except what is noted above in HPI  Physical Exam: BP (!) 161/85   Constitutional:  Alert and oriented, No acute distress. HEENT: [place] AT, moist mucus membranes.  Trachea midline, no masses. Cardiovascular: No clubbing, cyanosis, or edema. Respiratory: Normal respiratory effort, no increased work of breathing. GI: Abdomen is soft, nontender, nondistended, no abdominal masses GU: No CVA tenderness.  Lymph: No cervical or inguinal lymphadenopathy. Skin: No rashes, bruises or suspicious lesions. Neurologic: Grossly intact, no focal deficits, moving all 4 extremities. Psychiatric: Normal mood and affect.  Laboratory Data: Lab Results  Component Value Date   WBC 5.7 12/19/2022   HGB 11.5 (L) 12/19/2022   HCT 36.1 (L) 12/19/2022   MCV 89.6 12/19/2022   PLT 216 12/19/2022    Lab Results  Component Value Date   CREATININE 1.96 (H) 12/19/2022    No results found for: "PSA"  No results found for: "TESTOSTERONE"  Lab Results  Component Value Date   HGBA1C 5.6 08/24/2015    Urinalysis    Component Value Date/Time   COLORURINE YELLOW 04/12/2016 1046   APPEARANCEUR Clear 10/30/2022 1126   LABSPEC 1.025 04/12/2016 1046   PHURINE 6.5 04/12/2016 1046   GLUCOSEU Negative 10/30/2022 1126   HGBUR LARGE (A) 04/12/2016 1046   BILIRUBINUR Negative 10/30/2022 1126   KETONESUR 15 (A) 04/12/2016 1046   PROTEINUR Trace (A) 10/30/2022 1126   PROTEINUR >300 (A) 04/12/2016 1046   UROBILINOGEN 0.2 05/06/2013 1015   NITRITE Negative 10/30/2022 1126    NITRITE NEGATIVE 04/12/2016 1046   LEUKOCYTESUR Negative 10/30/2022 1126    Lab Results  Component Value Date   LABMICR Comment 10/30/2022   WBCUA None seen 10/31/2021   LABEPIT None seen 10/31/2021   MUCUS Present 10/31/2021   BACTERIA None seen 10/31/2021    Pertinent Imaging:  No results found for this or any previous visit.  No results found for this or any previous visit.  No results found for this or any previous visit.  No results found for this or any previous visit.  No results found for this or any previous visit.  No valid procedures specified. No results  found for this or any previous visit.  No results found for this or any previous visit.   Assessment & Plan:    1. Benign prostatic hyperplasia with urinary obstruction -improved after TURP - BLADDER SCAN AMB NON-IMAGING - Urinalysis, Routine w reflex microscopic  2. Malignant neoplasm of prostate (HCC) PSA today, if normal He will followup in 6 months with a PSA  3. Urinary retention Resolved after TURP   No follow-ups on file.  [name], MD  [facility]

## 2023-03-19 NOTE — Progress Notes (Signed)
post void residual=60

## 2023-03-21 LAB — PSA: Prostate Specific Ag, Serum: 0.5 ng/mL (ref 0.0–4.0)

## 2023-03-26 ENCOUNTER — Inpatient Hospital Stay (HOSPITAL_COMMUNITY)
Admission: EM | Admit: 2023-03-26 | Discharge: 2023-03-28 | DRG: 897 | Disposition: A | Discharge status: Home / Self Care | Payer: Medicare Other | Attending: Family Medicine | Admitting: Family Medicine

## 2023-03-26 ENCOUNTER — Other Ambulatory Visit: Payer: Self-pay

## 2023-03-26 ENCOUNTER — Encounter (HOSPITAL_COMMUNITY): Payer: Self-pay

## 2023-03-26 DIAGNOSIS — I952 Hypotension due to drugs: Secondary | ICD-10-CM | POA: Diagnosis not present

## 2023-03-26 DIAGNOSIS — F4320 Adjustment disorder, unspecified: Secondary | ICD-10-CM | POA: Diagnosis present

## 2023-03-26 DIAGNOSIS — N189 Chronic kidney disease, unspecified: Secondary | ICD-10-CM | POA: Diagnosis present

## 2023-03-26 DIAGNOSIS — Z880 Allergy status to penicillin: Secondary | ICD-10-CM

## 2023-03-26 DIAGNOSIS — T424X5A Adverse effect of benzodiazepines, initial encounter: Secondary | ICD-10-CM | POA: Diagnosis not present

## 2023-03-26 DIAGNOSIS — F32A Depression, unspecified: Secondary | ICD-10-CM | POA: Diagnosis present

## 2023-03-26 DIAGNOSIS — E872 Acidosis, unspecified: Secondary | ICD-10-CM | POA: Diagnosis present

## 2023-03-26 DIAGNOSIS — F10939 Alcohol use, unspecified with withdrawal, unspecified: Secondary | ICD-10-CM | POA: Diagnosis present

## 2023-03-26 DIAGNOSIS — N1831 Chronic kidney disease, stage 3a: Secondary | ICD-10-CM | POA: Diagnosis present

## 2023-03-26 DIAGNOSIS — Z23 Encounter for immunization: Secondary | ICD-10-CM | POA: Diagnosis present

## 2023-03-26 DIAGNOSIS — G621 Alcoholic polyneuropathy: Secondary | ICD-10-CM | POA: Diagnosis present

## 2023-03-26 DIAGNOSIS — Z7982 Long term (current) use of aspirin: Secondary | ICD-10-CM | POA: Diagnosis not present

## 2023-03-26 DIAGNOSIS — Z87891 Personal history of nicotine dependence: Secondary | ICD-10-CM

## 2023-03-26 DIAGNOSIS — Z9079 Acquired absence of other genital organ(s): Secondary | ICD-10-CM | POA: Diagnosis not present

## 2023-03-26 DIAGNOSIS — E785 Hyperlipidemia, unspecified: Secondary | ICD-10-CM | POA: Diagnosis present

## 2023-03-26 DIAGNOSIS — Z8249 Family history of ischemic heart disease and other diseases of the circulatory system: Secondary | ICD-10-CM

## 2023-03-26 DIAGNOSIS — Z79899 Other long term (current) drug therapy: Secondary | ICD-10-CM | POA: Diagnosis not present

## 2023-03-26 DIAGNOSIS — Z91030 Bee allergy status: Secondary | ICD-10-CM

## 2023-03-26 DIAGNOSIS — F1010 Alcohol abuse, uncomplicated: Principal | ICD-10-CM | POA: Diagnosis present

## 2023-03-26 DIAGNOSIS — I129 Hypertensive chronic kidney disease with stage 1 through stage 4 chronic kidney disease, or unspecified chronic kidney disease: Secondary | ICD-10-CM | POA: Diagnosis present

## 2023-03-26 DIAGNOSIS — Z635 Disruption of family by separation and divorce: Secondary | ICD-10-CM | POA: Diagnosis not present

## 2023-03-26 DIAGNOSIS — Y906 Blood alcohol level of 120-199 mg/100 ml: Secondary | ICD-10-CM | POA: Diagnosis present

## 2023-03-26 DIAGNOSIS — F1020 Alcohol dependence, uncomplicated: Secondary | ICD-10-CM | POA: Diagnosis present

## 2023-03-26 DIAGNOSIS — F10129 Alcohol abuse with intoxication, unspecified: Secondary | ICD-10-CM | POA: Diagnosis present

## 2023-03-26 DIAGNOSIS — N400 Enlarged prostate without lower urinary tract symptoms: Secondary | ICD-10-CM | POA: Diagnosis present

## 2023-03-26 DIAGNOSIS — F10239 Alcohol dependence with withdrawal, unspecified: Principal | ICD-10-CM | POA: Diagnosis present

## 2023-03-26 DIAGNOSIS — F10229 Alcohol dependence with intoxication, unspecified: Secondary | ICD-10-CM | POA: Diagnosis not present

## 2023-03-26 LAB — BASIC METABOLIC PANEL
Anion gap: 19 — ABNORMAL HIGH (ref 5–15)
BUN: 23 mg/dL (ref 8–23)
CO2: 17 mmol/L — ABNORMAL LOW (ref 22–32)
Calcium: 8.5 mg/dL — ABNORMAL LOW (ref 8.9–10.3)
Chloride: 107 mmol/L (ref 98–111)
Creatinine, Ser: 1.58 mg/dL — ABNORMAL HIGH (ref 0.61–1.24)
GFR, Estimated: 48 mL/min — ABNORMAL LOW (ref >60)
Glucose, Bld: 92 mg/dL (ref 70–99)
Potassium: 5 mmol/L (ref 3.5–5.1)
Sodium: 143 mmol/L (ref 135–145)

## 2023-03-26 LAB — RAPID URINE DRUG SCREEN, HOSP PERFORMED
Amphetamines: NOT DETECTED
Barbiturates: NOT DETECTED
Benzodiazepines: NOT DETECTED
Cocaine: NOT DETECTED
Opiates: NOT DETECTED
Tetrahydrocannabinol: NOT DETECTED

## 2023-03-26 LAB — HEPATIC FUNCTION PANEL
ALT: 16 U/L (ref 0–44)
AST: 26 U/L (ref 15–41)
Albumin: 3.8 g/dL (ref 3.5–5.0)
Alkaline Phosphatase: 86 U/L (ref 38–126)
Bilirubin, Direct: 0.1 mg/dL (ref 0.0–0.2)
Indirect Bilirubin: 0.5 mg/dL (ref 0.3–0.9)
Total Bilirubin: 0.6 mg/dL (ref 0.3–1.2)
Total Protein: 7.2 g/dL (ref 6.5–8.1)

## 2023-03-26 LAB — ETHANOL: Alcohol, Ethyl (B): 129 mg/dL — ABNORMAL HIGH (ref <10)

## 2023-03-26 LAB — URINALYSIS, ROUTINE W REFLEX MICROSCOPIC
Bacteria, UA: NONE SEEN
Bilirubin Urine: NEGATIVE
Glucose, UA: NEGATIVE mg/dL
Ketones, ur: 5 mg/dL — AB
Leukocytes,Ua: NEGATIVE
Nitrite: NEGATIVE
Protein, ur: >=300 mg/dL — AB
Specific Gravity, Urine: 1.015 (ref 1.005–1.030)
pH: 5 (ref 5.0–8.0)

## 2023-03-26 LAB — CBC WITH DIFFERENTIAL/PLATELET
Abs Immature Granulocytes: 0.05 10*3/uL (ref 0.00–0.07)
Basophils Absolute: 0 10*3/uL (ref 0.0–0.1)
Basophils Relative: 0 %
Eosinophils Absolute: 0 10*3/uL (ref 0.0–0.5)
Eosinophils Relative: 0 %
HCT: 45 % (ref 39.0–52.0)
Hemoglobin: 14.1 g/dL (ref 13.0–17.0)
Immature Granulocytes: 1 %
Lymphocytes Relative: 11 %
Lymphs Abs: 0.9 10*3/uL (ref 0.7–4.0)
MCH: 28.4 pg (ref 26.0–34.0)
MCHC: 31.3 g/dL (ref 30.0–36.0)
MCV: 90.5 fL (ref 80.0–100.0)
Monocytes Absolute: 0.2 10*3/uL (ref 0.1–1.0)
Monocytes Relative: 3 %
Neutro Abs: 6.8 10*3/uL (ref 1.7–7.7)
Neutrophils Relative %: 85 %
Platelets: 206 10*3/uL (ref 150–400)
RBC: 4.97 MIL/uL (ref 4.22–5.81)
RDW: 15 % (ref 11.5–15.5)
WBC: 8 10*3/uL (ref 4.0–10.5)
nRBC: 0 % (ref 0.0–0.2)

## 2023-03-26 LAB — MRSA NEXT GEN BY PCR, NASAL: MRSA by PCR Next Gen: NOT DETECTED

## 2023-03-26 LAB — CBG MONITORING, ED: Glucose-Capillary: 92 mg/dL (ref 70–99)

## 2023-03-26 LAB — MAGNESIUM: Magnesium: 1.3 mg/dL — ABNORMAL LOW (ref 1.7–2.4)

## 2023-03-26 MED ORDER — POLYETHYLENE GLYCOL 3350 17 G PO PACK: 17.0000 g | PACK | Freq: Every day | ORAL | Status: DC | PRN

## 2023-03-26 MED ORDER — THIAMINE HCL 100 MG/ML IJ SOLN: 100.0000 mg | Freq: Every day | INTRAMUSCULAR | Status: DC

## 2023-03-26 MED ORDER — GABAPENTIN 300 MG PO CAPS
600.0000 mg | ORAL_CAPSULE | Freq: Two times a day (BID) | ORAL | Status: DC
  Administered 2023-03-26 – 2023-03-28 (×4): 600 mg via ORAL
  Filled 2023-03-26 (×4): qty 2

## 2023-03-26 MED ORDER — BRIMONIDINE TARTRATE 0.2 % OP SOLN: 1.0000 [drp] | Freq: Every day | OPHTHALMIC | Status: DC | PRN

## 2023-03-26 MED ORDER — ONDANSETRON HCL 4 MG/2ML IJ SOLN: 4.0000 mg | Freq: Four times a day (QID) | INTRAMUSCULAR | Status: DC | PRN

## 2023-03-26 MED ORDER — THIAMINE MONONITRATE 100 MG PO TABS: 100.0000 mg | ORAL_TABLET | Freq: Every day | ORAL | Status: DC

## 2023-03-26 MED ORDER — THIAMINE HCL 100 MG/ML IJ SOLN
500.0000 mg | Freq: Once | INTRAVENOUS | Status: AC
  Administered 2023-03-26: 500 mg via INTRAVENOUS
  Filled 2023-03-26: qty 5

## 2023-03-26 MED ORDER — DEXTROSE-SODIUM CHLORIDE 5-0.9 % IV SOLN: INTRAVENOUS | Status: DC

## 2023-03-26 MED ORDER — ENOXAPARIN SODIUM 60 MG/0.6ML IJ SOSY: 60.0000 mg | PREFILLED_SYRINGE | INTRAMUSCULAR | Status: DC

## 2023-03-26 MED ORDER — ADULT MULTIVITAMIN W/MINERALS CH
1.0000 | ORAL_TABLET | Freq: Every day | ORAL | Status: DC
  Administered 2023-03-26 – 2023-03-28 (×3): 1 via ORAL
  Filled 2023-03-26 (×3): qty 1

## 2023-03-26 MED ORDER — PNEUMOCOCCAL 20-VAL CONJ VACC 0.5 ML IM SUSY
0.5000 mL | PREFILLED_SYRINGE | INTRAMUSCULAR | Status: AC
  Administered 2023-03-28: 0.5 mL via INTRAMUSCULAR
  Filled 2023-03-26: qty 0.5

## 2023-03-26 MED ORDER — METOPROLOL TARTRATE 50 MG PO TABS
50.0000 mg | ORAL_TABLET | Freq: Two times a day (BID) | ORAL | Status: DC
  Administered 2023-03-26 – 2023-03-28 (×4): 50 mg via ORAL
  Filled 2023-03-26 (×4): qty 1

## 2023-03-26 MED ORDER — LORAZEPAM 1 MG PO TABS
1.0000 mg | ORAL_TABLET | ORAL | Status: DC | PRN
  Administered 2023-03-26 (×2): 2 mg via ORAL
  Filled 2023-03-26 (×2): qty 2

## 2023-03-26 MED ORDER — LACTATED RINGERS IV BOLUS
1000.0000 mL | Freq: Once | INTRAVENOUS | Status: AC
  Administered 2023-03-26: 1000 mL via INTRAVENOUS

## 2023-03-26 MED ORDER — PANTOPRAZOLE SODIUM 40 MG PO TBEC
40.0000 mg | DELAYED_RELEASE_TABLET | Freq: Every day | ORAL | Status: DC
  Administered 2023-03-26 – 2023-03-28 (×3): 40 mg via ORAL
  Filled 2023-03-26 (×3): qty 1

## 2023-03-26 MED ORDER — CHLORDIAZEPOXIDE HCL 5 MG PO CAPS
10.0000 mg | ORAL_CAPSULE | Freq: Four times a day (QID) | ORAL | Status: DC
  Administered 2023-03-26 – 2023-03-27 (×3): 10 mg via ORAL
  Filled 2023-03-26 (×5): qty 2

## 2023-03-26 MED ORDER — GABAPENTIN 600 MG PO TABS: 600.0000 mg | ORAL_TABLET | Freq: Two times a day (BID) | ORAL | Status: DC

## 2023-03-26 MED ORDER — OXYCODONE HCL 5 MG PO TABS
5.0000 mg | ORAL_TABLET | Freq: Four times a day (QID) | ORAL | Status: DC | PRN
  Administered 2023-03-26 – 2023-03-27 (×3): 5 mg via ORAL
  Filled 2023-03-26 (×3): qty 1

## 2023-03-26 MED ORDER — THIAMINE HCL 100 MG/ML IJ SOLN
500.0000 mg | Freq: Three times a day (TID) | INTRAVENOUS | Status: AC
  Administered 2023-03-27 (×2): 500 mg via INTRAVENOUS
  Filled 2023-03-26 (×2): qty 5

## 2023-03-26 MED ORDER — CLONIDINE HCL 0.1 MG PO TABS
0.1000 mg | ORAL_TABLET | Freq: Two times a day (BID) | ORAL | Status: DC
  Administered 2023-03-26 – 2023-03-27 (×2): 0.1 mg via ORAL
  Filled 2023-03-26 (×2): qty 1

## 2023-03-26 MED ORDER — DEXTROSE 5 % AND 0.9 % NACL IV BOLUS: 1000.0000 mL | Freq: Once | INTRAVENOUS | Status: DC

## 2023-03-26 MED ORDER — THIAMINE HCL 100 MG/ML IJ SOLN
100.0000 mg | Freq: Every day | INTRAMUSCULAR | Status: DC
  Filled 2023-03-26: qty 2

## 2023-03-26 MED ORDER — LORAZEPAM 2 MG/ML IJ SOLN: 1.0000 mg | INTRAMUSCULAR | Status: DC | PRN

## 2023-03-26 MED ORDER — THIAMINE MONONITRATE 100 MG PO TABS
100.0000 mg | ORAL_TABLET | Freq: Every day | ORAL | Status: DC
  Administered 2023-03-27 – 2023-03-28 (×2): 100 mg via ORAL
  Filled 2023-03-26 (×2): qty 1

## 2023-03-26 MED ORDER — THIAMINE HCL 100 MG/ML IJ SOLN
100.0000 mg | Freq: Once | INTRAMUSCULAR | Status: AC
  Administered 2023-03-26: 100 mg via INTRAVENOUS
  Filled 2023-03-26: qty 2

## 2023-03-26 MED ORDER — ASPIRIN 81 MG PO TBEC
81.0000 mg | DELAYED_RELEASE_TABLET | Freq: Every day | ORAL | Status: DC
  Administered 2023-03-26 – 2023-03-27 (×2): 81 mg via ORAL
  Filled 2023-03-26 (×2): qty 1

## 2023-03-26 MED ORDER — ALBUTEROL SULFATE (2.5 MG/3ML) 0.083% IN NEBU: 2.5000 mg | INHALATION_SOLUTION | RESPIRATORY_TRACT | Status: DC | PRN

## 2023-03-26 MED ORDER — CLONIDINE HCL 0.2 MG PO TABS
0.2000 mg | ORAL_TABLET | Freq: Four times a day (QID) | ORAL | Status: DC | PRN
  Administered 2023-03-26: 0.2 mg via ORAL
  Filled 2023-03-26: qty 1

## 2023-03-26 MED ORDER — CHLORHEXIDINE GLUCONATE CLOTH 2 % EX PADS
6.0000 | MEDICATED_PAD | Freq: Every day | CUTANEOUS | Status: DC
  Administered 2023-03-27 – 2023-03-28 (×2): 6 via TOPICAL

## 2023-03-26 MED ORDER — CHLORDIAZEPOXIDE HCL 25 MG PO CAPS
50.0000 mg | ORAL_CAPSULE | Freq: Once | ORAL | Status: AC
  Administered 2023-03-26: 50 mg via ORAL
  Filled 2023-03-26: qty 2

## 2023-03-26 MED ORDER — ONDANSETRON HCL 4 MG PO TABS
4.0000 mg | ORAL_TABLET | Freq: Four times a day (QID) | ORAL | Status: DC | PRN
  Administered 2023-03-26: 4 mg via ORAL
  Filled 2023-03-26: qty 1

## 2023-03-26 MED ORDER — FOLIC ACID 1 MG PO TABS
1.0000 mg | ORAL_TABLET | Freq: Every day | ORAL | Status: DC
  Administered 2023-03-26 – 2023-03-28 (×3): 1 mg via ORAL
  Filled 2023-03-26 (×3): qty 1

## 2023-03-26 MED ORDER — MAGNESIUM OXIDE 400 MG PO TABS
400.0000 mg | ORAL_TABLET | Freq: Every day | ORAL | Status: DC
  Administered 2023-03-26 – 2023-03-28 (×3): 400 mg via ORAL
  Filled 2023-03-26 (×9): qty 1

## 2023-03-26 MED ORDER — MELATONIN 10 MG PO CAPS
10.0000 mg | ORAL_CAPSULE | Freq: Every evening | ORAL | Status: DC | PRN
  Filled 2023-03-26: qty 1

## 2023-03-26 MED ORDER — HYDRALAZINE HCL 20 MG/ML IJ SOLN
5.0000 mg | INTRAMUSCULAR | Status: DC | PRN
  Administered 2023-03-26 – 2023-03-27 (×2): 5 mg via INTRAVENOUS
  Filled 2023-03-26 (×2): qty 1

## 2023-03-26 MED ORDER — PATIROMER SORBITEX CALCIUM 8.4 G PO PACK
8.4000 g | PACK | ORAL | Status: DC
  Filled 2023-03-26 (×3): qty 1

## 2023-03-26 MED ORDER — MAGNESIUM SULFATE 2 GM/50ML IV SOLN
2.0000 g | Freq: Once | INTRAVENOUS | Status: AC
  Administered 2023-03-26: 2 g via INTRAVENOUS
  Filled 2023-03-26: qty 50

## 2023-03-26 MED ORDER — ONDANSETRON HCL 4 MG/2ML IJ SOLN
4.0000 mg | Freq: Once | INTRAMUSCULAR | Status: AC
  Administered 2023-03-26: 4 mg via INTRAVENOUS
  Filled 2023-03-26: qty 2

## 2023-03-26 NOTE — Plan of Care (Signed)

## 2023-03-26 NOTE — BH Assessment (Addendum)
Clinician checked with RN Kenn File to see if patient was able to have his teleassessment.  Patient is currently asleep.  TTS to check back later.

## 2023-03-26 NOTE — ED Triage Notes (Signed)
Pt arrived [facility] from home. Pt sponsor called [facility] due to pt has been drinking half of gallon of liquor since last night . Pt states he drank one month ago and sponsor said 2 years ago. Pt is going through a divorce and started drinking. Sponsor feels pt needs fluids.

## 2023-03-26 NOTE — ED Notes (Signed)
EDP at bedside during triage. Pt requested spouse ANN removed from system and another person added. Request granted.

## 2023-03-26 NOTE — ED Notes (Addendum)
Instructed by provider to hold bolus until dose of thiamine given.

## 2023-03-26 NOTE — H&P (Addendum)
TRH H&P   Patient Demographics:    Tony Patterson, is a 67 y.o. male  MRN: [id number]   DOB - [birth year]  Admit Date - 03/26/2023  Outpatient Primary MD Carylon Perches, MD  Patient coming from: home  Chief Complaint  Patient presents with   Alcohol Intoxication      HPI:    Tony Patterson  is a 67 y.o. male, with past medical history of alcohol related peripheral neuropathy, alcohol abuse, hypertension, CKD stage III A, BPH, patient with history of heavy alcohol abuse in the past, but has been sober for some time, but he is going through a divorce, and one of his close friend has died, so he has been having heavy alcohol abuse over the last few weeks, has been drinking for last few weeks, and he drank half of gallon of liquor since last night, denies any suicidal thoughts, ideations, reports some nausea, denies vomiting, denies chest pain, shortness of breath, reports poor -in ED his labs were significant for alcohol level of 129, potassium of 5, creatinine 1.58 (around baseline, anion gap of 19, magnesium of 1.3, he was significantly, hypertensive, blood pressure 192/120, tachycardic with heart rate of 131, with tremors, So [facility]  hospitalist consulted to admit for withdrawals.    Review of systems:      A full 10 point Review of Systems was done, except as stated above, all other Review of Systems were negative.   With Past History of the following :    Past Medical History:  Diagnosis Date   Alcohol abuse    Anemia    Anxiety    Hypertension    Neuropathy    Neuropathy    Seizures (HCC)    unknown etiology and on no meds; been 4 years since seizure.   Sleep apnea       Past Surgical History:  Procedure Laterality Date   COLONOSCOPY  05/18/2007   [id number] hemorrhoids, a single anal papilla, otherwise normal/ Single polyp, as described above, removed    COLONOSCOPY  WITH PROPOFOL N/A 03/16/2015   Procedure: COLONOSCOPY WITH PROPOFOL;  Surgeon: Corbin Ade, MD;  Location: [facility];  Service: Endoscopy;  Laterality: N/A;  in cecum at 1344. withdrawal time    CYSTOSCOPY WITH INSERTION OF UROLIFT N/A 03/05/2021   Procedure: CYSTOSCOPY WITH INSERTION OF UROLIFT;  Surgeon: Malen Gauze, MD;  Location: [facility];  Service: Urology;  Laterality: N/A;   THROAT SURGERY     polyps removed x2   TRANSURETHRAL RESECTION OF PROSTATE N/A 12/12/2022   Procedure: TRANSURETHRAL RESECTION OF THE PROSTATE (TURP);  Surgeon: Malen Gauze, MD;  Location: [facility];  Service: Urology;  Laterality: N/A;      Social History:     Social History   Tobacco Use   Smoking status: Former    Current packs/day: 0.00    Average packs/day:  0.5 packs/day for 20.0 years (10.0 ttl pk-yrs)    Types: Cigarettes    Start date: 06/03/1975    Quit date: 06/03/1995    Years since quitting: 27.8   Smokeless tobacco: Never  Substance Use Topics   Alcohol use: Yes    Comment: none in 2 months as of 11/26/2018, 1/2 gallon liquor 2024        Family History :     Family History  Problem Relation Age of Onset   Fibromyalgia Mother    Dementia Mother    Congestive Heart Failure Mother    Congestive Heart Failure Father    Coronary artery disease Father 57       CABG 3 different times   Hypertension Brother    CAD Brother 24   Hypertension Brother    CAD Brother 25   Neuropathy Neg Hx    Colon cancer Neg Hx       Home Medications:   Prior to Admission medications   Medication Sig Start Date End Date Taking? Authorizing Provider  acetaminophen (TYLENOL) 500 MG tablet Take 1,000 mg by mouth every 6 (six) hours as needed for moderate pain.    [provider]  aspirin EC 81 MG tablet Take 81 mg by mouth at bedtime.    [provider]  Brimonidine Tartrate (LUMIFY) 0.025 % SOLN Place 1 drop into both eyes daily as needed (redness).    [provider]  EPINEPHrine 0.3 mg/0.3 mL IJ SOAJ injection Inject 0.3 mg into the muscle as needed for anaphylaxis.    [provider]  gabapentin (NEURONTIN) 600 MG tablet TAKE TWO TABLETS (1200MG ) BY MOUTH THREETIMES A DAY AS NEEDED Patient taking differently: Take 600 mg by mouth 2 (two) times daily. 08/01/20   Anson Fret, MD  LORazepam (ATIVAN) 1 MG tablet Take 1 mg by mouth daily as needed for anxiety.    [provider]  magnesium oxide (MAG-OX) 400 MG tablet Take 400 mg by mouth daily.    [provider]  Melatonin 10 MG CAPS Take 10 mg by mouth at bedtime as needed (sleep).    [provider]  metoprolol (LOPRESSOR) 50 MG tablet Take 50 mg by mouth 2 (two) times daily.  10/09/11   Carylon Perches, MD  Multiple Vitamin (MULTIVITAMIN WITH MINERALS) TABS tablet Take 1 tablet by mouth daily. 04/18/16   Ghimire, Werner Lean, MD  Naphazoline HCl (CLEAR EYES OP) Place 1 drop into both eyes daily as needed (irritation/dry eyes).    [provider]  omeprazole (PRILOSEC) 20 MG capsule Take 20 mg by mouth daily.     [provider]  oxyCODONE (OXY IR/ROXICODONE) 5 MG immediate release tablet Take 1 tablet (5 mg total) by mouth every 4 (four) hours as needed for moderate pain. 12/13/22   McKenzie, Mardene Celeste, MD  Polyethyl Glycol-Propyl Glycol (SYSTANE OP) Place 1 drop into both eyes daily as needed (redness).    [provider]  VELTASSA 8.4 g packet Take 8.4 g by mouth every Monday, Wednesday, and Friday. 10/25/22   [provider]     Allergies:     Allergies  Allergen Reactions   Bee Venom Anaphylaxis   Penicillins Other (See Comments)    Childhood allergic reaction - no other information available     Physical Exam:   Vitals  Blood pressure (!) 141/96, pulse (!) 121, temperature 98.4 F (36.9 C), temperature source Oral, resp. rate 19, height 6\' 3"  (1.905  m), weight 122.5 kg, SpO2 96%.   1. General Developed male,  laying in bed, no apparent distress, but mildly anxious, he has tremors present bilaterally  2. Normal affect and insight, Not Suicidal or Homicidal, Awake Alert, Oriented X 3.  3. No F.N deficits, ALL C.Nerves Intact, Strength 5/5 all 4 extremities, Sensation intact all 4 extremities, Plantars down going.  4. Ears and Eyes appear Normal, Conjunctivae clear, PERRLA. Moist Oral Mucosa.  5. Supple Neck, No JVD, No cervical lymphadenopathy appriciated, No Carotid Bruits.  6. Symmetrical Chest wall movement, Good air movement bilaterally, CTAB.  7.  Tachycardic, No Gallops, Rubs or Murmurs, No Parasternal Heave.  8. Positive Bowel Sounds, Abdomen Soft, No tenderness, No organomegaly appriciated,No rebound -guarding or rigidity.  9.  No Cyanosis, Normal Skin Turgor, No Skin Rash or Bruise.  10. Good muscle tone,  joints appear normal , no effusions, Normal ROM.    Data Review:    CBC Recent Labs  Lab 03/26/23 1351  WBC 8.0  HGB 14.1  HCT 45.0  PLT 206  MCV 90.5  MCH 28.4  MCHC 31.3  RDW 15.0  LYMPHSABS 0.9  MONOABS 0.2  EOSABS 0.0  BASOSABS 0.0   ------------------------------------------------------------------------------------------------------------------  Chemistries  Recent Labs  Lab 03/26/23 1351  NA 143  K 5.0  CL 107  CO2 17*  GLUCOSE 92  BUN 23  CREATININE 1.58*  CALCIUM 8.5*  MG 1.3*  AST 26  ALT 16  ALKPHOS 86  BILITOT 0.6   ------------------------------------------------------------------------------------------------------------------ estimated creatinine clearance is 64.9 mL/min (A) (by C-G formula based on SCr of 1.58 mg/dL (H)). ------------------------------------------------------------------------------------------------------------------ No results for input(s): "TSH", "T4TOTAL", "T3FREE", "THYROIDAB" in the last 72 hours.  Invalid input(s): "FREET3"  Coagulation profile No results for input(s): "INR", "PROTIME" in the last 168  hours. ------------------------------------------------------------------------------------------------------------------- No results for input(s): "DDIMER" in the last 72 hours. -------------------------------------------------------------------------------------------------------------------  Cardiac Enzymes No results for input(s): "CKMB", "TROPONINI", "MYOGLOBIN" in the last 168 hours.  Invalid input(s): "CK" ------------------------------------------------------------------------------------------------------------------    Component Value Date/Time   BNP 610.0 (H) 04/12/2016 1053     ---------------------------------------------------------------------------------------------------------------  Urinalysis    Component Value Date/Time   COLORURINE YELLOW 03/26/2023 1241   APPEARANCEUR CLEAR 03/26/2023 1241   APPEARANCEUR Clear 03/19/2023 1325   LABSPEC 1.015 03/26/2023 1241   PHURINE 5.0 03/26/2023 1241   GLUCOSEU NEGATIVE 03/26/2023 1241   HGBUR MODERATE (A) 03/26/2023 1241   BILIRUBINUR NEGATIVE 03/26/2023 1241   BILIRUBINUR Negative 03/19/2023 1325   KETONESUR 5 (A) 03/26/2023 1241   PROTEINUR >=300 (A) 03/26/2023 1241   UROBILINOGEN 0.2 05/06/2013 1015   NITRITE NEGATIVE 03/26/2023 1241   LEUKOCYTESUR NEGATIVE 03/26/2023 1241    ----------------------------------------------------------------------------------------------------------------   Imaging Results:    No results found.   EKG:  Vent. rate 120 BPM PR interval 173 ms QRS duration 81 ms QT/QTcB 305/431 ms P-R-T axes 69 61 37 Sinus tachycardia  Assessment & Plan:    Principal Problem:   Alcohol withdrawal (HCC) Active Problems:   Alcohol abuse, continuous   Dyslipidemia   Chronic kidney disease   Depression   Alcoholic peripheral neuropathy (HCC)   Hypomagnesemia   Alcohol abuse, with alcohol withdrawal -With known history of heavy alcohol abuse in the past, with binge drinking over la 5  weeks, even though alcohol level is elevated, he does appear to be with Sansert withdrawals, including anxiety, nausea, tremors,  and tachycardia and elevated blood pressure -Will be started on CIWA protocol stepdown Ativan protocol -will keep on scheduled low  dose Librium as well -Continue with IV fluids, will give high-dose IV thiamine prior to initiation of fluids as he will be on D5 NS due to national shortage  Hypertension -Pressure significantly elevated due to withdrawals, resume home dose metoprolol, first dose now, will start on scheduled low-dose clonidine as well to assist with his withdrawals, will keep on as needed hydralazine  CKd stage IIIa -Renal function at baseline, avoid nephrotoxic medications -He appears to be on Veltassa, will continue during hospital stay as he has been having some readings of elevated potassium in the past  History of depression Grief reaction -Does not appear to be on any home medication, appears to be having grief reaction as going through a divorce, he has recently friend committed suicide. -He is clearly not suicidal, does not endorse thoughts or ideations -Psychiatry consult has been requested in ED  Addendum: Patient denies any suicidal thoughts or ideation, and reports he said " I feel so bad, I thought I was going to die, I did not feel well at all after my binge drinking yesterday" he was not feeling well from his alcohol intoxication and heavy drinking, but he denies again any suicidal thoughts or ideations, , but by reviewing ED notes, patient's friend was concerned, so TTS consult was placed by ED physician, I was not able to reach patient's friend, for now I will keep patient on a sitter awaiting psychiatric consult.     Alcoholic peripheral neuropathy -continue with gabapentin  Hypomagnesemia -Repleted   DVT Prophylaxis Lovenox   AM Labs Ordered, also please review Full Orders  Family Communication: Admission, patients condition  and plan of care including tests being ordered have been discussed with the patient  (was unable to reach friend Marlane Hatcher, he was left a voicemail) who indicate understanding and agree with the plan and Code Status.  Code Status Full  Likely DC to  home  Condition GUARDED    Consults called: none    Admission status: inpatient    Time spent in minutes : 70 minutes   Huey Bienenstock M.D on 03/26/2023 at 4:28 PM   [facility] - Office  [phone]

## 2023-03-26 NOTE — ED Provider Notes (Addendum)
[facility] Patterson EMERGENCY DEPARTMENT AT [facility] Provider Note   CSN: [id number] Arrival date & time: 03/26/23  1215     History  Chief Complaint  Patient presents with   Alcohol Intoxication    Tony Patterson is a 67 y.o. male.  HPI Patient presents for alcohol intoxication.  He has a long history of alcoholism.  He was previously sober up until 5 weeks ago.  Today, his friend was notified that he was intoxicated and making concerning statements.  Patient reportedly said that today was that he was going to die.  He did have a friend recently committed suicide.  Patient, himself, has been going through a difficult divorce lately.  Due to the concerns of self-harm, patient's friend called 911.  On arrival, patient denies any areas of pain.  He does endorse recent heavy alcohol use.  He estimates that his last alcoholic drink was around midnight.  He has had some mild nausea.  He denies any other current symptoms.  He, himself, denies suicidal ideation.    Home Medications Prior to Admission medications   Medication Sig Start Date End Date Taking? Authorizing Provider  acetaminophen (TYLENOL) 500 MG tablet Take 1,000 mg by mouth every 6 (six) hours as needed for moderate pain.    [provider]  aspirin EC 81 MG tablet Take 81 mg by mouth at bedtime.    [provider]  Brimonidine Tartrate (LUMIFY) 0.025 % SOLN Place 1 drop into both eyes daily as needed (redness).    [provider]  EPINEPHrine 0.3 mg/0.3 mL IJ SOAJ injection Inject 0.3 mg into the muscle as needed for anaphylaxis.    [provider]  gabapentin (NEURONTIN) 600 MG tablet TAKE TWO TABLETS (1200MG ) BY MOUTH THREETIMES A DAY AS NEEDED Patient taking differently: Take 600 mg by mouth 2 (two) times daily. 08/01/20   Anson Fret, MD  LORazepam (ATIVAN) 1 MG tablet Take 1 mg by mouth daily as needed for anxiety.    [provider]  magnesium oxide (MAG-OX) 400 MG tablet  Take 400 mg by mouth daily.    [provider]  Melatonin 10 MG CAPS Take 10 mg by mouth at bedtime as needed (sleep).    [provider]  metoprolol (LOPRESSOR) 50 MG tablet Take 50 mg by mouth 2 (two) times daily.  10/09/11   Carylon Perches, MD  Multiple Vitamin (MULTIVITAMIN WITH MINERALS) TABS tablet Take 1 tablet by mouth daily. 04/18/16   Ghimire, Werner Lean, MD  Naphazoline HCl (CLEAR EYES OP) Place 1 drop into both eyes daily as needed (irritation/dry eyes).    [provider]  omeprazole (PRILOSEC) 20 MG capsule Take 20 mg by mouth daily.     [provider]  oxyCODONE (OXY IR/ROXICODONE) 5 MG immediate release tablet Take 1 tablet (5 mg total) by mouth every 4 (four) hours as needed for moderate pain. 12/13/22   McKenzie, Mardene Celeste, MD  Polyethyl Glycol-Propyl Glycol (SYSTANE OP) Place 1 drop into both eyes daily as needed (redness).    [provider]  VELTASSA 8.4 g packet Take 8.4 g by mouth every Monday, Wednesday, and Friday. 10/25/22   [provider]      Allergies    Bee venom and Penicillins    Review of Systems   Review of Systems  Gastrointestinal:  Positive for nausea.  Psychiatric/Behavioral:  Positive for suicidal ideas.   All other systems reviewed and are negative.   Physical Exam  Updated Vital Signs BP (!) 141/96 (BP Location: Right Arm)   Pulse (!) 121   Temp 98.4 F (36.9 C) (Oral)   Resp 19   Ht 6\' 3"  (1.905 m)   Wt 122.5 kg   SpO2 96%   BMI 33.75 kg/m  Physical Exam Vitals and nursing note reviewed.  Constitutional:      General: He is not in acute distress.    Appearance: Normal appearance. He is well-developed. He is not ill-appearing, toxic-appearing or diaphoretic.  HENT:     Head: Normocephalic and atraumatic.     Right Ear: External ear normal.     Left Ear: External ear normal.     Nose: Nose normal.     Mouth/Throat:     Mouth: Mucous membranes are moist.  Eyes:     Extraocular Movements:  Extraocular movements intact.     Conjunctiva/sclera: Conjunctivae normal.  Cardiovascular:     Rate and Rhythm: Regular rhythm. Tachycardia present.     Heart sounds: No murmur heard. Pulmonary:     Effort: Pulmonary effort is normal. No respiratory distress.     Breath sounds: Normal breath sounds. No wheezing or rales.  Chest:     Chest wall: No tenderness.  Abdominal:     General: There is no distension.     Palpations: Abdomen is soft.     Tenderness: There is no abdominal tenderness.  Musculoskeletal:        General: No swelling. Normal range of motion.     Cervical back: Normal range of motion and neck supple.  Skin:    General: Skin is warm and dry.     Coloration: Skin is not jaundiced or pale.  Neurological:     General: No focal deficit present.     Mental Status: He is alert and oriented to person, place, and time.     Cranial Nerves: No cranial nerve deficit.     Sensory: No sensory deficit.     Motor: No weakness.     Coordination: Coordination normal.  Psychiatric:        Mood and Affect: Mood and affect normal.        Speech: Speech is slurred.        Behavior: Behavior normal. Behavior is cooperative.     ED Results / Procedures / Treatments   Labs (all labs ordered are listed, but only abnormal results are displayed) Labs Reviewed  URINALYSIS, ROUTINE W REFLEX MICROSCOPIC - Abnormal; Notable for the following components:      Result Value   Hgb urine dipstick MODERATE (*)    Ketones, ur 5 (*)    Protein, ur >=300 (*)    All other components within normal limits  BASIC METABOLIC PANEL - Abnormal; Notable for the following components:   CO2 17 (*)    Creatinine, Ser 1.58 (*)    Calcium 8.5 (*)    GFR, Estimated 48 (*)    Anion gap 19 (*)    All other components within normal limits  MAGNESIUM - Abnormal; Notable for the following components:   Magnesium 1.3 (*)    All other components within normal limits  ETHANOL - Abnormal; Notable for the  following components:   Alcohol, Ethyl (B) 129 (*)    All other components within normal limits  CBC WITH DIFFERENTIAL/PLATELET  HEPATIC FUNCTION PANEL  RAPID URINE DRUG SCREEN, HOSP PERFORMED  CBG MONITORING, ED    EKG None  Radiology No results found.  Procedures Procedures  Medications Ordered in ED Medications  magnesium sulfate IVPB 2 g 50 mL (has no administration in time range)  dextrose 5 % and 0.9% NaCl 5-0.9 % bolus 1,000 mL (has no administration in time range)  chlordiazePOXIDE (LIBRIUM) capsule 50 mg (has no administration in time range)  thiamine (VITAMIN B1) injection 100 mg (100 mg Intravenous Given 03/26/23 1300)  lactated ringers bolus 1,000 mL (1,000 mLs Intravenous New Bag/Given 03/26/23 1300)  ondansetron (ZOFRAN) injection 4 mg (4 mg Intravenous Given 03/26/23 1301)    ED Course/ Medical Decision Making/ A&P                                 Medical Decision Making Amount and/or Complexity of Data Reviewed Labs: ordered.  Risk Prescription drug management. Decision regarding hospitalization.   This patient presents to the ED for concern of alcohol intoxication, possible suicidal ideation, this involves an extensive number of treatment options, and is a complaint that carries with it a high risk of complications and morbidity.  The differential diagnosis includes complications of intoxication, metabolic derangements, psychosocial stressors, coingestions   Co morbidities that complicate the patient evaluation  Alcohol abuse, anemia, HTN, seizures, sleep apnea   Additional history obtained:  Additional history obtained from patient's friend External records from outside source obtained and reviewed including EMR   Lab Tests:  I Ordered, and personally interpreted labs.  The pertinent results include: Baseline creatinine, anion gap metabolic acidosis consistent with starvation/alcoholic ketosis; moderately elevated ethanol level;  hypomagnesemia with otherwise normal electrolytes; normal hemoglobin, no leukocytosis  Cardiac Monitoring: / EKG:  The patient was maintained on a cardiac monitor.  I personally viewed and interpreted the cardiac monitored which showed an underlying rhythm of: Sinus rhythm   Consultations Obtained:  I requested consultation with the CTS,  and discussed lab and imaging findings as well as pertinent plan - they recommend: (Pending)   Problem List / ED Course / Critical interventions / Medication management  Patient presents for alcohol intoxication and possible suicidal ideation.  He has been drinking heavily.  His friend called EMS today due to concerning statements that patient had made about today being his last down-to-earth.  On arrival, patient is tachycardic.  He is alert and oriented.  Speech is slurred consistent with intoxication.  He states that the last alcohol drink he had was approximately 12 hours prior to arrival.  IV fluids were ordered and laboratory workup was initiated.  Patient endorses some nausea.  Zofran was provided.  Lab work is notable for anion gap metabolic acidosis and hypomagnesemia.  His ethanol level is moderately elevated.  Following IV fluids, he remained tachycardic.  Dextrose containing IV fluids were ordered.  His friend now accompanies him at bedside and is able to provide further history.  Given his friends concern of suicidal ideation, TTS was consulted.  Patient reports improved nausea.  Although he denies any current symptoms, he does remain tachycardic.  There is concern of impending withdrawal.  Dose of Librium was ordered.  Hospitalist was consulted for consideration of admission.  Patient was admitted for further management. I ordered medication including IV fluids for hydration; Zofran for nausea; Librium for alcohol withdrawal; magnesium sulfate for hypomagnesemia Reevaluation of the patient after these medicines showed that the patient improved I have  reviewed the patients home medicines and have made adjustments as needed   Social Determinants of Health:  History of alcohol abuse, ongoing  Final Clinical Impression(s) / ED Diagnoses Final diagnoses:  Alcohol abuse    Rx / DC Orders ED Discharge Orders     None         Gloris Manchester, MD 03/26/23 1601    Gloris Manchester, MD 03/26/23 [phone]

## 2023-03-26 NOTE — Progress Notes (Signed)
PHARMACIST - PHYSICIAN COMMUNICATION  CONCERNING:  Enoxaparin (Lovenox) for DVT Prophylaxis   RECOMMENDATION: Patient was prescribed enoxaparin 40mg  q24 hours for VTE prophylaxis.   Filed Weights   03/26/23 1226 03/26/23 1234  Weight: 107.5 kg (236 lb 15.9 oz) 122.5 kg (270 lb)   Body mass index is 33.75 kg/m.  Based on [facility] policy patient is candidate for enoxaparin 0.5mg /kg TBW SQ every 24 hours based on BMI being >30.  DESCRIPTION: Pharmacy has adjusted enoxaparin dose per [facility] policy.  Patient is now receiving enoxaparin 60 mg every 24 hours.   Littie Deeds, PharmD Pharmacy Resident  03/26/2023 6:44 PM

## 2023-03-26 NOTE — ED Notes (Signed)
Attempted to call report to ICU  Nurse to call back  

## 2023-03-26 NOTE — ED Notes (Signed)
Pt attempting to use urinal at this time.

## 2023-03-26 NOTE — Progress Notes (Signed)
Patient admitted to ICU room 6. Patient alert and oriented x4. Patient wife, Dewayne Hatch came to unit stating she was here to see room 6 and explained she knew why he was here. Explained wanding process for patient safety and security present to wand. However, patient stated he did not want to see her. This was explained to the visitor and she left without any issues. Patient states that his spouse knows why he is here that his friends told her why he was here due to drinking and going through stuff. But he does not want to see Dewayne Hatch. Ann wanted to leave phone number but informed her that we cannot add her or call to update her. She expressed understanding. Updated patient who states he is ok and once again stated she already knows why he's here.

## 2023-03-27 ENCOUNTER — Encounter: Payer: Self-pay | Admitting: Urology

## 2023-03-27 ENCOUNTER — Encounter (HOSPITAL_COMMUNITY): Payer: Self-pay | Admitting: Internal Medicine

## 2023-03-27 DIAGNOSIS — F1020 Alcohol dependence, uncomplicated: Secondary | ICD-10-CM | POA: Diagnosis present

## 2023-03-27 DIAGNOSIS — F10229 Alcohol dependence with intoxication, unspecified: Secondary | ICD-10-CM

## 2023-03-27 DIAGNOSIS — F4320 Adjustment disorder, unspecified: Secondary | ICD-10-CM

## 2023-03-27 DIAGNOSIS — F10129 Alcohol abuse with intoxication, unspecified: Secondary | ICD-10-CM | POA: Diagnosis present

## 2023-03-27 LAB — CBC
HCT: 40 % (ref 39.0–52.0)
Hemoglobin: 12.5 g/dL — ABNORMAL LOW (ref 13.0–17.0)
MCH: 28.5 pg (ref 26.0–34.0)
MCHC: 31.3 g/dL (ref 30.0–36.0)
MCV: 91.1 fL (ref 80.0–100.0)
Platelets: 180 10*3/uL (ref 150–400)
RBC: 4.39 MIL/uL (ref 4.22–5.81)
RDW: 15.1 % (ref 11.5–15.5)
WBC: 6.3 10*3/uL (ref 4.0–10.5)
nRBC: 0 % (ref 0.0–0.2)

## 2023-03-27 LAB — BASIC METABOLIC PANEL
Anion gap: 9 (ref 5–15)
BUN: 21 mg/dL (ref 8–23)
CO2: 24 mmol/L (ref 22–32)
Calcium: 8.2 mg/dL — ABNORMAL LOW (ref 8.9–10.3)
Chloride: 107 mmol/L (ref 98–111)
Creatinine, Ser: 1.45 mg/dL — ABNORMAL HIGH (ref 0.61–1.24)
GFR, Estimated: 53 mL/min — ABNORMAL LOW (ref >60)
Glucose, Bld: 122 mg/dL — ABNORMAL HIGH (ref 70–99)
Potassium: 4.4 mmol/L (ref 3.5–5.1)
Sodium: 140 mmol/L (ref 135–145)

## 2023-03-27 LAB — HIV ANTIBODY (ROUTINE TESTING W REFLEX): HIV Screen 4th Generation wRfx: NONREACTIVE

## 2023-03-27 MED ORDER — ENOXAPARIN SODIUM 60 MG/0.6ML IJ SOSY
50.0000 mg | PREFILLED_SYRINGE | INTRAMUSCULAR | Status: DC
  Administered 2023-03-27: 50 mg via SUBCUTANEOUS
  Filled 2023-03-27: qty 0.6

## 2023-03-27 MED ORDER — LACTATED RINGERS IV BOLUS
500.0000 mL | Freq: Once | INTRAVENOUS | Status: AC
  Administered 2023-03-27: 500 mL via INTRAVENOUS

## 2023-03-27 NOTE — Progress Notes (Signed)
   03/27/23 1150  Spiritual Encounters  Type of Visit Initial  Care provided to: Patient  Referral source Other (comment) (Spiritual Consult)  Reason for visit Advance directives  OnCall Visit No   Chaplain responded to a spiritual consult for advanced directive education. The patient, Tony Patterson was alert and happy to meet with me. Tony Patterson does not have his glasses with him and that limited him in what he could do. I went over the documents and assured him if he had any questions when he is able to read the documents a chaplain will return and provide support.   Valerie Roys [facility]  [phone]

## 2023-03-27 NOTE — Consult Note (Addendum)
Telepsych Consultation   Reason for Consult: Psych Consult, "Concerns for suicide and ETOH"  Referring Physician:  Dr. Jarvis Newcomer, MD [facility] of [facility]: [facility]  Unit: [facility]    Bed: WU98 / IC06-01   Location of Provider: Other: [facility]  Patient Identification: Tony Patterson MRN:  [id number] Principal Diagnosis: Adjustment disorder, unspecified Diagnosis:  Principal Problem:   Adjustment disorder, unspecified Active Problems:   Alcohol abuse, continuous   Dyslipidemia   Chronic kidney disease   Depression   Alcoholic peripheral neuropathy (HCC)   Hypomagnesemia   Alcohol withdrawal (HCC)   Alcohol abuse with intoxication (HCC)   Alcohol use disorder, severe, dependence (HCC)   Total Time spent with patient: 45 minutes  Subjective:   Tony Patterson is a 67 y.o. caucasian male with a pertinent past psychiatric history of EtOH use disorder severe, and pertinent medical comorbidities/history that include CKD stage IIIa, BPH, and peripheral neuropathy, and additional past and pertinent psychiatric history of severe alcohol withdrawal x1 which led to hospitalization in 2017, who presented this encounter by way of [facility] from home, after friends called EMS for concerns of the patient being heavily intoxicated and making concerning statements, possibly suggesting desire to commit suicide.   Upon evaluation and arrival to the emergency department, the patient presented with a blood alcohol of 129 and appeared intoxicated, and because of the severity of the patient's history from 2017 of severe EtOH withdrawal, and potential for impending withdrawal, patient was subsequently admitted to the hospital floor for medical monitoring.  Patient currently is voluntary at this time. Patient has remained on the medical floor at this time in the ICU for safety and monitoring.  HPI:   Patient seen today from [facility] emergency department while the patient was present in the intensive care  unit at [facility].  Upon evaluation, patient endorses that he is a recovering alcoholic and recently had 2-1/2 years of sobriety, but about 5 weeks ago states that he relapsed on EtOH, due to psychosocial stressors. Patient endorses that when he relapsed 5 weeks ago he had 1 drink at his home and immediately regretted it and took himself to AA and got his 30-day chip, but unfortunately Tuesday night due to psychosocial stressors, states that he relapsed again, but only this time he began binge drinking, and from Tuesday night into Wednesday morning, states that he consumed about a half a gallon of bourbon.  Discussing the events that transpired which led to the patient being brought to the hospital, patient endorses that after he had stopped drinking late Tuesday night and into early Wednesday morning, he states  he proceeded to put his CPAP on in an attempt to go to bed, when all he states he remembers is waking up and his CPAP malfunctioning and making him feel like he was dying and having difficulty breathing, and his friends being present and concerned, so they called EMS and had him brought in for safety.  Patient states that he never made any suicidal statements, or even suggested a desire to end his life or commit suicide, but does state that he told his friends that he felt like he was dying and was not going to make it, states "I just felt like death, I literally felt like I was dying, I have no idea what happened".  Patient endorses no history of suicide attempts and/or thoughts of suicide and denies self-injurious behavior past or present.   Discussing how his friends might have known  that he was not doing well, he states that every day he contacts his friends by text message or phone call to let them know that he is sober, and so because he had been drinking he never reached out to them, thus he states this is the reason why they came over and found him in the poor condition that they  did.  Expanding on psychosocial stressors that have caused his 2 relapses these last 5 weeks, patient endorses that he is currently going through a divorce, his old original alcohol sponsor who was a friend of his and support person committed suicide about a month ago, and his 2 brothers in collaboration together stopped paying for their father's quarter of a million dollar special season tickets that the patient has always wanted, causing the tickets to expire and no longer be available.  Patient denies any other stressors, denies financial stressors, states that he is well off and even has he shares extra to help others when it has been needed.  Patient reports that he is currently retired.  Discussing the patient's EtOH history, patient endorses that he has been drinking nearly his whole life and working functionally, but if he had to pinpoint it, states that he believes his drinking got out of hand and became a major problem around 2007, states that this is when he sought help and started seriously going to AA meetings and trying to avoid drinking. Patient endorses that today he continues to use AA meetings almost daily as a part of his plan for abstaining from alcohol, states that it is very helpful for him and has been effective over the years.  Patient endorses that as a part of his troubles with EtOH over the years, states that in 2017 at [facility] health he was medically induced into a coma for severe EtOH withdrawal and was subsequently discharged to [facility] rehabilitation center for substance use, as well as participated in 2020 at the [facility] program for EtOH use disorder.  Patient endorses no mental health problems outside of struggling with EtOH use disorder over the years.  Patient endorses no inpatient mental health hospitalizations and/or outpatient mental health service use.  Patient endorses no previous mental health medication use.  Patient endorses no history of drug use.  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  Patient during evaluation endorsed no instability in his mood, stated his mood was euthymic with a congruent affect, and that he was not having any EtOH withdrawal symptomology.  Patient directly and formally denied suicidal homicidal ideations, denied auditory and/or visual hallucinations, and denied paranoid ideations.  Patient endorsed he was concerned when he first got to the hospital about his medical condition, given that he was admitted to the medical floor, but today states that he feels a lot better, states that he feels outside of the safe and secure environment of the hospital he would be safe, states that he has a lot of support with his 3 friends he speaks to almost every day.  Collateral, patient's friend, Marlane Hatcher, [phone]  Call placed and extensive conversation held with the patient's friend, one of the 3 friends who called EMS for concerns that the patient was not well and unsafe.  Patient's friend reports that it is true, the patient is in daily contact with 2 of his friends to report that  he has not used alcohol, has been doing this for many years, and when the patient did not do this Wednesday morning, states that he and all of the other friends who are close with the patient got concerned, so they came over to check on him.  Patient's friend reports that they (him and two additional friends) found Tony Patterson the patient physically intoxicated and stating that he felt like he was dying, which really concerned them, so they called the patient's good friend [place] and she told them to call EMS and have the patient brought in for safety, which led to the patient being brought in this encounter.  Patient's friend states that he does not feel that Tony Patterson ever meant to suggest suicide, states now reflecting on the events that  transpired that Tony Patterson probably had said the things that he did because he was feeling physically unwell.  Patient's friend states that he does not have any safety concerns for his friend, states that it is possible that upon discharge due to psychosocial stressors Tony Patterson has mentioned he could relapse again and use EtOH, but that he does not think that even this is very likely, states that Tony Patterson has a lot of support from himself and his other friends, as well as individuals from [place] he attends regularly.  Discussed with the patient's friend that the recommendation today would be for safety planning to be put in the place with him as the patient's friend, as well as the additional recommendations below, to which patient's friend endorsed that he was amenable to this. Patient's friend additionally and notably endorsed there are no guns or dangerous items in the home of concern that the patient could harm himself with if he did attempt suicide.   Past Psychiatric History: ETOH use disorder (severe); severe EtOH withdraw which required hospitalization (2017)    Risk to Self: No Risk to Others: No Prior MH Inpatient Therapy: No Prior MH Outpatient Therapy: No  Past Medical History:  Past Medical History:  Diagnosis Date   Alcohol abuse    Anemia    Anxiety    Hypertension    Neuropathy    Neuropathy    Seizures (HCC)    unknown etiology and on no meds; been 4 years since seizure.   Sleep apnea     Past Surgical History:  Procedure Laterality Date   COLONOSCOPY  05/18/2007   UYQ:[id number] hemorrhoids, a single anal papilla, otherwise normal/ Single polyp, as described above, removed    COLONOSCOPY WITH PROPOFOL N/A 03/16/2015   Procedure: COLONOSCOPY WITH PROPOFOL;  Surgeon: Corbin Ade, MD;  Location: [facility];  Service: Endoscopy;  Laterality: N/A;  in cecum at 1344. withdrawal time    CYSTOSCOPY WITH INSERTION OF UROLIFT N/A 03/05/2021   Procedure: CYSTOSCOPY WITH INSERTION OF UROLIFT;   Surgeon: Malen Gauze, MD;  Location: [facility];  Service: Urology;  Laterality: N/A;   THROAT SURGERY     polyps removed x2   TRANSURETHRAL RESECTION OF PROSTATE N/A 12/12/2022   Procedure: TRANSURETHRAL RESECTION OF THE PROSTATE (TURP);  Surgeon: Malen Gauze, MD;  Location: [facility];  Service: Urology;  Laterality: N/A;   Family History:  Family History  Problem Relation Age of Onset   Fibromyalgia Mother    Dementia Mother    Congestive Heart Failure Mother    Congestive Heart Failure Father    Coronary artery disease Father 27       CABG 3 different times   Hypertension Brother  CAD Brother 62   Hypertension Brother    CAD Brother 69   Neuropathy Neg Hx    Colon cancer Neg Hx    Family Psychiatric  History: None endorsed Social History:  Social History   Substance and Sexual Activity  Alcohol Use Yes   Comment: none in 2 months as of 11/26/2018, 1/2 gallon liquor 2024     Social History   Substance and Sexual Activity  Drug Use No    Social History   Socioeconomic History   Marital status: Legally Separated    Spouse name: Not on file   Number of children: 0   Years of education: college 1   Highest education level: Not on file  Occupational History   Occupation: Writer: [employer]    Comment: Neurosurgeon  Tobacco Use   Smoking status: Former    Current packs/day: 0.00    Average packs/day: 0.5 packs/day for 20.0 years (10.0 ttl pk-yrs)    Types: Cigarettes    Start date: 06/03/1975    Quit date: 06/03/1995    Years since quitting: 27.8   Smokeless tobacco: Never  Vaping Use   Vaping status: Never Used  Substance and Sexual Activity   Alcohol use: Yes    Comment: none in 2 months as of 11/26/2018, 1/2 gallon liquor 2024   Drug use: No   Sexual activity: Never    Birth control/protection: None  Other Topics Concern   Not on file  Social History Narrative   Left-handed   Caffeine use: 1 large cup of coffee in  the morning, Coke (caffeine free) ocass   Social Determinants of Health   Financial Resource Strain: Not on file  Food Insecurity: No Food Insecurity (03/26/2023)   Hunger Vital Sign    Worried About Running Out of Food in the Last Year: Never true    Ran Out of Food in the Last Year: Never true  Transportation Needs: No Transportation Needs (03/26/2023)   PRAPARE - Administrator, Civil Service (Medical): No    Lack of Transportation (Non-Medical): No  Physical Activity: Not on file  Stress: Not on file  Social Connections: Not on file   Additional Social History:    Allergies:   Allergies  Allergen Reactions   Bee Venom Anaphylaxis   Penicillins Other (See Comments)    Childhood allergic reaction - no other information available    Labs:  Results for orders placed or performed during the hospital encounter of 03/26/23 (from the past 48 hour(s))  Urinalysis, Routine w reflex microscopic -Urine, Clean Catch     Status: Abnormal   Collection Time: 03/26/23 12:41 PM  Result Value Ref Range   Color, Urine YELLOW YELLOW   APPearance CLEAR CLEAR   Specific Gravity, Urine 1.015 1.005 - 1.030   pH 5.0 5.0 - 8.0   Glucose, UA NEGATIVE NEGATIVE mg/dL   Hgb urine dipstick MODERATE (A) NEGATIVE   Bilirubin Urine NEGATIVE NEGATIVE   Ketones, ur 5 (A) NEGATIVE mg/dL   Protein, ur >=540 (A) NEGATIVE mg/dL   Nitrite NEGATIVE NEGATIVE   Leukocytes,Ua NEGATIVE NEGATIVE   RBC / HPF 0-5 0 - 5 RBC/hpf   WBC, UA 0-5 0 - 5 WBC/hpf   Bacteria, UA NONE SEEN NONE SEEN   Squamous Epithelial / HPF 0-5 0 - 5 /HPF   Mucus PRESENT     Comment: Performed at [facility], [address] [place], [place] [address]  Urine  rapid drug screen (hosp performed)     Status: None   Collection Time: 03/26/23 12:42 PM  Result Value Ref Range   Opiates NONE DETECTED NONE DETECTED   Cocaine NONE DETECTED NONE DETECTED   Benzodiazepines NONE DETECTED NONE DETECTED   Amphetamines NONE DETECTED  NONE DETECTED   Tetrahydrocannabinol NONE DETECTED NONE DETECTED   Barbiturates NONE DETECTED NONE DETECTED    Comment: (NOTE) DRUG SCREEN FOR MEDICAL PURPOSES ONLY.  IF CONFIRMATION IS NEEDED FOR ANY PURPOSE, NOTIFY LAB WITHIN 5 DAYS.  LOWEST DETECTABLE LIMITS FOR URINE DRUG SCREEN Drug Class                     Cutoff (ng/mL) Amphetamine and metabolites    1000 Barbiturate and metabolites    200 Benzodiazepine                 200 Opiates and metabolites        300 Cocaine and metabolites        300 THC                            50 Performed at [facility], [address] [place], [place] [address]   CBG monitoring, ED     Status: None   Collection Time: 03/26/23  1:28 PM  Result Value Ref Range   Glucose-Capillary 92 70 - 99 mg/dL    Comment: Glucose reference range applies only to samples taken after fasting for at least 8 hours.  CBC WITH DIFFERENTIAL     Status: None   Collection Time: 03/26/23  1:51 PM  Result Value Ref Range   WBC 8.0 4.0 - 10.5 K/uL   RBC 4.97 4.22 - 5.81 MIL/uL   Hemoglobin 14.1 13.0 - 17.0 g/dL   HCT 65.7 84.6 - 96.2 %   MCV 90.5 80.0 - 100.0 fL   MCH 28.4 26.0 - 34.0 pg   MCHC 31.3 30.0 - 36.0 g/dL   RDW 95.2 84.1 - 32.4 %   Platelets 206 150 - 400 K/uL   nRBC 0.0 0.0 - 0.2 %   Neutrophils Relative % 85 %   Neutro Abs 6.8 1.7 - 7.7 K/uL   Lymphocytes Relative 11 %   Lymphs Abs 0.9 0.7 - 4.0 K/uL   Monocytes Relative 3 %   Monocytes Absolute 0.2 0.1 - 1.0 K/uL   Eosinophils Relative 0 %   Eosinophils Absolute 0.0 0.0 - 0.5 K/uL   Basophils Relative 0 %   Basophils Absolute 0.0 0.0 - 0.1 K/uL   Immature Granulocytes 1 %   Abs Immature Granulocytes 0.05 0.00 - 0.07 K/uL    Comment: Performed at [facility], [address], [place], [place] [address]  Basic metabolic panel     Status: Abnormal   Collection Time: 03/26/23  1:51 PM  Result Value Ref Range   Sodium 143 135 - 145 mmol/L   Potassium 5.0 3.5 - 5.1 mmol/L   Chloride 107 98 -  111 mmol/L   CO2 17 (L) 22 - 32 mmol/L   Glucose, Bld 92 70 - 99 mg/dL    Comment: Glucose reference range applies only to samples taken after fasting for at least 8 hours.   BUN 23 8 - 23 mg/dL   Creatinine, Ser 7.25 (H) 0.61 - 1.24 mg/dL   Calcium 8.5 (L) 8.9 - 10.3 mg/dL   GFR, Estimated 48 (L) >60 mL/min  Comment: (NOTE) Calculated using the CKD-EPI Creatinine Equation (2021)    Anion gap 19 (H) 5 - 15    Comment: Performed at [facility], [address] [place], [place] [address]  Hepatic function panel     Status: None   Collection Time: 03/26/23  1:51 PM  Result Value Ref Range   Total Protein 7.2 6.5 - 8.1 g/dL   Albumin 3.8 3.5 - 5.0 g/dL   AST 26 15 - 41 U/L   ALT 16 0 - 44 U/L   Alkaline Phosphatase 86 38 - 126 U/L   Total Bilirubin 0.6 0.3 - 1.2 mg/dL   Bilirubin, Direct 0.1 0.0 - 0.2 mg/dL   Indirect Bilirubin 0.5 0.3 - 0.9 mg/dL    Comment: Performed at [facility], [address], [place], [place] [address]  Magnesium     Status: Abnormal   Collection Time: 03/26/23  1:51 PM  Result Value Ref Range   Magnesium 1.3 (L) 1.7 - 2.4 mg/dL    Comment: Performed at [facility], [address], [place], [place] [address]  Ethanol     Status: Abnormal   Collection Time: 03/26/23  1:51 PM  Result Value Ref Range   Alcohol, Ethyl (B) 129 (H) <10 mg/dL    Comment: (NOTE) Lowest detectable limit for serum alcohol is 10 mg/dL.  For medical purposes only. Performed at [facility], [address] [place], [place] [address]   MRSA Next Gen by PCR, Nasal     Status: None   Collection Time: 03/26/23  6:30 PM   Specimen: Nasal Mucosa; Nasal Swab  Result Value Ref Range   MRSA by PCR Next Gen NOT DETECTED NOT DETECTED    Comment: (NOTE) The GeneXpert MRSA Assay (FDA approved for NASAL specimens only), is one component of a comprehensive MRSA colonization surveillance program. It is not intended to diagnose MRSA infection nor to guide or monitor treatment for MRSA  infections. Test performance is not FDA approved in patients less than 47 years old. Performed at [facility], [address] [place], [place] [address]   HIV Antibody (routine testing w rflx)     Status: None   Collection Time: 03/27/23  5:01 AM  Result Value Ref Range   HIV Screen 4th Generation wRfx Non Reactive Non Reactive    Comment: Performed at [facility], [address], [place], [place] [address]  Basic metabolic panel     Status: Abnormal   Collection Time: 03/27/23  5:01 AM  Result Value Ref Range   Sodium 140 135 - 145 mmol/L   Potassium 4.4 3.5 - 5.1 mmol/L   Chloride 107 98 - 111 mmol/L   CO2 24 22 - 32 mmol/L   Glucose, Bld 122 (H) 70 - 99 mg/dL    Comment: Glucose reference range applies only to samples taken after fasting for at least 8 hours.   BUN 21 8 - 23 mg/dL   Creatinine, Ser 4.01 (H) 0.61 - 1.24 mg/dL   Calcium 8.2 (L) 8.9 - 10.3 mg/dL   GFR, Estimated 53 (L) >60 mL/min    Comment: (NOTE) Calculated using the CKD-EPI Creatinine Equation (2021)    Anion gap 9 5 - 15    Comment: Performed at [facility], [address], [place], [place] [address]  CBC     Status: Abnormal   Collection Time: 03/27/23  5:01 AM  Result Value Ref Range   WBC 6.3 4.0 - 10.5 K/uL   RBC  4.39 4.22 - 5.81 MIL/uL   Hemoglobin 12.5 (L) 13.0 - 17.0 g/dL   HCT 40.9 81.1 - 91.4 %   MCV 91.1 80.0 - 100.0 fL   MCH 28.5 26.0 - 34.0 pg   MCHC 31.3 30.0 - 36.0 g/dL   RDW 78.2 95.6 - 21.3 %   Platelets 180 150 - 400 K/uL   nRBC 0.0 0.0 - 0.2 %    Comment: Performed at [facility], [address], [place], [place] [address]    Medications:  Current Facility-Administered Medications  Medication Dose Route Frequency Provider Last Rate Last Admin   albuterol (PROVENTIL) (2.5 MG/3ML) 0.083% nebulizer solution 2.5 mg  2.5 mg Nebulization Q2H PRN Elgergawy, Leana Roe, MD       aspirin EC tablet 81 mg  81 mg Oral QHS Elgergawy, Leana Roe, MD   81 mg at 03/26/23 2208   brimonidine  (ALPHAGAN) 0.2 % ophthalmic solution 1 drop  1 drop Both Eyes Daily PRN Elgergawy, Leana Roe, MD       Chlorhexidine Gluconate Cloth 2 % PADS 6 each  6 each Topical Q0600 Elgergawy, Leana Roe, MD   6 each at 03/27/23 0620   cloNIDine (CATAPRES) tablet 0.2 mg  0.2 mg Oral Q6H PRN Elgergawy, Leana Roe, MD   0.2 mg at 03/26/23 2208   enoxaparin (LOVENOX) injection 50 mg  50 mg Subcutaneous Q24H Hazeline Junker B, MD       folic acid (FOLVITE) tablet 1 mg  1 mg Oral Daily Elgergawy, Leana Roe, MD   1 mg at 03/27/23 8469   gabapentin (NEURONTIN) capsule 600 mg  600 mg Oral BID Elgergawy, Leana Roe, MD   600 mg at 03/27/23 0940   hydrALAZINE (APRESOLINE) injection 5 mg  5 mg Intravenous Q4H PRN Elgergawy, Leana Roe, MD   5 mg at 03/27/23 6295   LORazepam (ATIVAN) tablet 1-4 mg  1-4 mg Oral Q1H PRN Elgergawy, Leana Roe, MD   2 mg at 03/26/23 2208   Or   LORazepam (ATIVAN) injection 1-4 mg  1-4 mg Intravenous Q1H PRN Elgergawy, Leana Roe, MD       magnesium oxide (MAG-OX) tablet 400 mg  400 mg Oral Daily Elgergawy, Leana Roe, MD   400 mg at 03/27/23 2841   Melatonin CAPS 10 mg  10 mg Oral QHS PRN Elgergawy, Leana Roe, MD       metoprolol tartrate (LOPRESSOR) tablet 50 mg  50 mg Oral BID Elgergawy, Leana Roe, MD   50 mg at 03/27/23 3244   multivitamin with minerals tablet 1 tablet  1 tablet Oral Daily Elgergawy, Leana Roe, MD   1 tablet at 03/27/23 0937   ondansetron (ZOFRAN) tablet 4 mg  4 mg Oral Q6H PRN Elgergawy, Leana Roe, MD   4 mg at 03/26/23 1840   Or   ondansetron (ZOFRAN) injection 4 mg  4 mg Intravenous Q6H PRN Elgergawy, Leana Roe, MD       oxyCODONE (Oxy IR/ROXICODONE) immediate release tablet 5 mg  5 mg Oral Q6H PRN Elgergawy, Leana Roe, MD   5 mg at 03/27/23 0239   pantoprazole (PROTONIX) EC tablet 40 mg  40 mg Oral Daily Elgergawy, Leana Roe, MD   40 mg at 03/27/23 0102   patiromer (VELTASSA) packet 8.4 g  8.4 g Oral Q M,W,F Elgergawy, Leana Roe, MD       pneumococcal 20-valent conjugate vaccine (PREVNAR 20)  injection 0.5 mL  0.5 mL Intramuscular Tomorrow-1000 Elgergawy, Leana Roe, MD  polyethylene glycol (MIRALAX / GLYCOLAX) packet 17 g  17 g Oral Daily PRN Elgergawy, Leana Roe, MD       thiamine (VITAMIN B1) tablet 100 mg  100 mg Oral Daily Elgergawy, Leana Roe, MD   100 mg at 03/27/23 0941   Or   thiamine (VITAMIN B1) injection 100 mg  100 mg Intravenous Daily Elgergawy, Leana Roe, MD        Musculoskeletal: Strength & Muscle Tone: within normal limits Gait & Station: normal Patient leans: N/A          Psychiatric Specialty Exam:  Presentation  General Appearance:  Appropriate for Environment; Fairly Groomed  Eye Contact: Good  Speech: Clear and Coherent; Normal Rate  Speech Volume: Normal  Handedness: Right   Mood and Affect  Mood: Euthymic  Affect: Appropriate   Thought Process  Thought Processes: Linear; Goal Directed; Coherent  Descriptions of Associations:Intact  Orientation:Full (Time, Place and Person)  Thought Content:Logical  History of Schizophrenia/Schizoaffective disorder:No data recorded Duration of Psychotic Symptoms:No data recorded Hallucinations:Hallucinations: None  Ideas of Reference:None  Suicidal Thoughts:Suicidal Thoughts: No  Homicidal Thoughts:Homicidal Thoughts: No   Sensorium  Memory: Immediate Good; Recent Good; Remote Good  Judgment: Intact  Insight: Present; Good   Executive Functions  Concentration: Good  Attention Span: Good  Recall: Good  Fund of Knowledge: Good  Language: Good   Psychomotor Activity  Psychomotor Activity: Psychomotor Activity: Normal   Assets  Assets: Communication Skills; Desire for Improvement; Financial Resources/Insurance; Housing; Leisure Time; Physical Health; Resilience; Social Support; Talents/Skills; Transportation; Vocational/Educational   Sleep  Sleep: Sleep: Good    Physical Exam: Physical Exam Vitals and nursing note reviewed.   Constitutional:      General: He is not in acute distress.    Appearance: He is not ill-appearing, toxic-appearing or diaphoretic.  Pulmonary:     Effort: Pulmonary effort is normal.  Skin:    General: Skin is warm and dry.  Neurological:     Mental Status: He is alert and oriented to person, place, and time.     Motor: No tremor or seizure activity.  Psychiatric:        Attention and Perception: Attention and perception normal. He does not perceive auditory or visual hallucinations.        Mood and Affect: Mood and affect normal.        Speech: Speech normal.        Behavior: Behavior normal. Behavior is not agitated, slowed, aggressive, withdrawn, hyperactive or combative. Behavior is cooperative.        Thought Content: Thought content normal. Thought content is not paranoid or delusional. Thought content does not include homicidal or suicidal ideation.        Cognition and Memory: Cognition and memory normal.        Judgment: Judgment normal.    Review of Systems  Neurological:  Negative for tremors, seizures and weakness.  Psychiatric/Behavioral:  Positive for substance abuse (ETOH). Negative for depression, hallucinations and suicidal ideas. The patient is not nervous/anxious and does not have insomnia.   All other systems reviewed and are negative.  Blood pressure 119/77, pulse 74, temperature 98.3 F (36.8 C), temperature source Oral, resp. rate 13, height 6\' 3"  (1.905 m), weight 105.4 kg, SpO2 93%. Body mass index is 29.05 kg/m.  Treatment Plan Summary:  Patient presented this encounter by way of [facility] from home after friends called EMS for concerns of the patient being heavily intoxicated and making concerning statements possibly suggesting desire  to commit suicide.   Upon evaluation/consultation investigation, patient presented with symptomology this encounter that is most consistent with an adjustment disorder with maladaptive reactions (I.e., unspecified), with a recent  relapse on alcohol with abuse and intoxication, as a part of his chronic and historical severe alcohol use disorder.  Patient during evaluation endorsed no withdrawal symptomology, denied suicidal homicidal ideations, did not appear to present with and or endorse psychotic features giving concern for decompensation into psychosis, and/or endorse any current instability in his mood.   Given the findings during evaluation/consultation investigation, and collateral information obtained from the patient's friends, recommendation is for psychiatric clearance, and the additional recommendations below.  Extensive conversation held with collaborating provider Dr. Lucianne Muss who agrees with plan of care and recommendations listed below.  Recommendations-psychiatrically cleared  # Adjustment disorder, unspecified (HCC) #Alcohol abuse with intoxication (HCC) #Alcohol use disorder, severe, dependence (HCC)  -Recommend continue AA meetings upon discharge -Recommend patient consider outpatient substance abuse/psychiatric medication management, if/when he feels needed -Recommend safety plan below upon discharge -Recommend discharge upon medical clearance  Safety Plan Tony Patterson will reach out to Marlane Hatcher (Friend), call 911 or call mobile crisis, or go to nearest emergency room if condition worsens or if suicidal thoughts become appreciable. Patients' will follow up with [facility] for outpatient psychiatric services (therapy/medication management) if/when he feels it is needed, as well as will consider outpatient substance abuse resources (outside of current AA meetings), if/when he feels it is needed. The suicide prevention education provided includes the following: Suicide risk factors Suicide prevention and interventions National Suicide Hotline telephone number [facility] assessment telephone number [facility] Emergency Assistance 911 [facility] and/or Residential Mobile Crisis  Unit telephone number Request made of family/significant other to:  Marlane Hatcher (Friend) [employer] (e.g., guns, rifles, knives), all items previously/currently identified as safety concern.   Remove drugs/medications (over the counter, prescriptions, illicit drugs), all items previously/currently identified as a safety concern.  Disposition: No evidence of imminent risk to self or others at present.   Patient does not meet criteria for psychiatric inpatient admission. Supportive therapy provided about ongoing stressors. Discussed crisis plan, support from social network, calling 911, coming to the Emergency Department, and calling Suicide Hotline.  This service was provided via telemedicine using a 2-way, interactive audio and video technology.  Names of all persons participating in this telemedicine service and their role in this encounter. Name: Arsenio Loader, NP Role: Psych NP Consult   Name: Dr. Jarvis Newcomer, MD Role: Hospitalist   Name: Quin Hoop, RN Role: Care Nurse  Name: Role:     Lenox Ponds, NP 03/27/2023 3:59 PM

## 2023-03-27 NOTE — Patient Instructions (Signed)

## 2023-03-27 NOTE — Plan of Care (Signed)

## 2023-03-27 NOTE — Progress Notes (Signed)
Sitter at bedside. Room checked to ensure safety for pt. No items left in room, all cords,curtains tied with tape.  Door open.   Pt.pleasant and compliant with all interventions. Observed resting, interacting with staff or watching television.  No concerns at this time.

## 2023-03-27 NOTE — Progress Notes (Signed)
[facility] PROGRESS NOTE  Tony Patterson (DOB: 1955-12-20) GNF:[id number] PCP: Carylon Perches, MD  Brief Narrative: Tony Patterson is a 67 y.o. male with a history of EtOH abuse, alcoholism in remission with recent relapse, stage IIIa CKD, BPH, peripheral neuropathy who presented to the ED on 03/26/2023 with alcohol intoxication, passive SI. He already appeared tremulous suggestive of early withdrawal, was admitted to SDU on CIWA protocol.   Subjective: Reports he has a sponsor, plans to return to meetings and stop drinking. Has been to [facility], but doesn't think that will be required again. Does not want spouse involved/informed in his care as he is in the midst of filing for divorce (one of his cited stressors). No mention of SI/HI at this time. Later BP dropped and he felt foggy headed which improved with bolus. Able to eat/communicate fine.   Objective: BP 119/77   Pulse 74   Temp 98.3 F (36.8 C) (Oral)   Resp 13   Ht 6\' 3"  (1.905 m)   Wt 105.4 kg   SpO2 93%   BMI 29.05 kg/m   Gen: No distress Pulm: Clear, nonlabored  CV: Regular, normal rate, no MRG or edema GI: Soft, NT, ND, +BS. Neuro: Alert and oriented. No new focal deficits. Ext: Warm, no deformities. Skin: No rashes, lesions or ulcers on visualized skin   Assessment & Plan: Alcoholism with recent relapse, alcohol abuse and withdrawal:  - Continue detox protocol. Due to concern for hypotension, I have stopped the librium scheduled and will continue prn ativan. If withdrawal symptoms worsen, low threshold to restart librium and/or phenobarbital.  - Continue routine CIWA in SDU as his withdrawal symptoms may not have peaked yet.  - Added clonidine, though with hypotension, will stop that. Given IVF bolus with resolution/normotension now.   Stage IIIa CKD:  - Monitor renal parameters, avoid nephrotoxins, renally dose medications  Depression, grief reaction, adverse SDOH:  - TTS/psychiatry consulted as he had  reported SI previously. Keeping him with sitter pending that evaluation.   HTN:  - Given metoprolol, will keep this unless BP goes low again.   Tyrone Nine, MD [facility] [email or web address] 03/27/2023, 3:36 PM

## 2023-03-27 NOTE — TOC CM/SW Note (Signed)
TOC consulted for substance use resources. CSW notes per chart review that pt has SDOH concerns for utilities and is high risk for readmission. CSW arrived to pts room, pt currently on tele psych call evaluation. TOC to follow with pt at a later time. TOC to follow.

## 2023-03-28 DIAGNOSIS — F4320 Adjustment disorder, unspecified: Secondary | ICD-10-CM | POA: Diagnosis not present

## 2023-03-28 NOTE — Discharge Summary (Signed)
Physician Discharge Summary   Patient: Tony Patterson MRN: [id number] DOB: 09-08-1955  Admit date:     03/26/2023  Discharge date: 03/28/23  Discharge Physician: Tony Patterson   PCP: Tony Perches, MD   Recommendations at discharge:  Continue routine AA meeting attendance, social supports, and PCP follow up.   Discharge Diagnoses: Principal Problem:   Adjustment disorder, unspecified Active Problems:   Alcohol abuse, continuous   Dyslipidemia   Chronic kidney disease   Depression   Alcoholic peripheral neuropathy (HCC)   Hypomagnesemia   Alcohol withdrawal (HCC)   Alcohol abuse with intoxication (HCC)   Alcohol use disorder, severe, dependence [facility])  Hospital Course: Tony Patterson is a 67 y.o. male with a history of EtOH abuse, alcoholism in remission with recent relapse, stage IIIa CKD, BPH, peripheral neuropathy who presented to the ED on 03/26/2023 with alcohol intoxication, passive SI. He already appeared tremulous suggestive of early withdrawal, was admitted to SDU on CIWA protocol. He developed hypotension with librium which was stopped, CIWA scores have actually been zero suggesting his binge had not caused dependence as of yet. He has a normal mental status, good social supports have been confirmed, no current safety concerns per medical or psychiatry teams. He is hemodynamically stable, having maximized benefit of inpatient management, he is discharged on 10/25.   Assessment and Plan: Alcoholism with recent relapse, alcohol abuse and withdrawal: Withdrawal has subsided, as expected based on short duration of binge.  - No further need for monitoring (CIWA scores zero x24 hours now).   - Well established in recovery supports.    Stage IIIa CKD: Stable.  - Monitor renal parameters at follow up, avoid nephrotoxins, renally dose medications   Depression, grief reaction, adverse SDOH:  - TTS/psychiatry consulted as he had reported SI previously. This was not noted with psychiatry  consult nor voiced to medical team. He is deemed low enough risk to safely discharge at this time. Multiple social supports have been confirmed. Affect and mood normal at this time.    HTN:  - Continue home metoprolol.  Consultants: Psychiatry Procedures performed: None  Disposition: Home Diet recommendation:  Cardiac diet DISCHARGE MEDICATION: Allergies as of 03/28/2023       Reactions   Bee Venom Anaphylaxis   Penicillins Other (See Comments)   Childhood allergic reaction - no other information available        Medication List     TAKE these medications    acetaminophen 500 MG tablet Commonly known as: TYLENOL Take 1,000 mg by mouth every 6 (six) hours as needed for moderate pain.   aspirin EC 81 MG tablet Take 81 mg by mouth at bedtime.   CLEAR EYES OP Place 1 drop into both eyes daily as needed (irritation/dry eyes).   EPINEPHrine 0.3 mg/0.3 mL Soaj injection Commonly known as: EPI-PEN Inject 0.3 mg into the muscle as needed for anaphylaxis.   gabapentin 600 MG tablet Commonly known as: NEURONTIN TAKE TWO TABLETS (1200MG ) BY MOUTH THREETIMES A DAY AS NEEDED What changed: See the new instructions.   LORazepam 1 MG tablet Commonly known as: ATIVAN Take 1 mg by mouth daily as needed for anxiety.   Lumify 0.025 % Soln Generic drug: Brimonidine Tartrate Place 1 drop into both eyes daily as needed (redness).   magnesium oxide 400 MG tablet Commonly known as: MAG-OX Take 400 mg by mouth daily.   Melatonin 10 MG Caps Take 10 mg by mouth at bedtime as needed (sleep).   metoprolol tartrate  50 MG tablet Commonly known as: LOPRESSOR Take 50 mg by mouth 2 (two) times daily.   multivitamin with minerals Tabs tablet Take 1 tablet by mouth daily.   omeprazole 20 MG capsule Commonly known as: PRILOSEC Take 20 mg by mouth daily.   SYSTANE OP Place 1 drop into both eyes daily as needed (redness).   Veltassa 8.4 g packet Generic drug: patiromer Take 8.4 g  by mouth every Monday, Wednesday, and Friday.        Follow-up Information     Tony Perches, MD Follow up.   Specialty: Internal Medicine Contact information: [address] [place] [address] [phone]                Discharge Exam: Tony Patterson Weights   03/26/23 1226 03/26/23 1234 03/26/23 1852  Weight: 107.5 kg 122.5 kg 105.4 kg  BP (!) 154/84   Pulse 81   Temp (!) 97.5 F (36.4 C) (Oral)   Resp 16   Ht 6\' 3"  (1.905 m)   Wt 105.4 kg   SpO2 95%   BMI 29.05 kg/m   No distress RRR, no MRG Clear, nonlabored Alert, oriented, cooperative, pleasant, no tremor, asterixis or focal deficits. Normal mood and affect. No SI or AVH.  Condition at discharge: stable  The results of significant diagnostics from this hospitalization (including imaging, microbiology, ancillary and laboratory) are listed below for reference.   Imaging Studies: No results found.  Microbiology: Results for orders placed or performed during the hospital encounter of 03/26/23  MRSA Next Gen by PCR, Nasal     Status: None   Collection Time: 03/26/23  6:30 PM   Specimen: Nasal Mucosa; Nasal Swab  Result Value Ref Range Status   MRSA by PCR Next Gen NOT DETECTED NOT DETECTED Final    Comment: (NOTE) The GeneXpert MRSA Assay (FDA approved for NASAL specimens only), is one component of a comprehensive MRSA colonization surveillance program. It is not intended to diagnose MRSA infection nor to guide or monitor treatment for MRSA infections. Test performance is not FDA approved in patients less than 68 years old. Performed at [facility], [address] [place], [place] [address]     Labs: CBC: Recent Labs  Lab 03/26/23 1351 03/27/23 0501  WBC 8.0 6.3  NEUTROABS 6.8  --   HGB 14.1 12.5*  HCT 45.0 40.0  MCV 90.5 91.1  PLT 206 180   Basic Metabolic Panel: Recent Labs  Lab 03/26/23 1351 03/27/23 0501  NA 143 140  K 5.0 4.4  CL 107 107  CO2 17* 24  GLUCOSE 92 122*  BUN  23 21  CREATININE 1.58* 1.45*  CALCIUM 8.5* 8.2*  MG 1.3*  --    Liver Function Tests: Recent Labs  Lab 03/26/23 1351  AST 26  ALT 16  ALKPHOS 86  BILITOT 0.6  PROT 7.2  ALBUMIN 3.8   CBG: Recent Labs  Lab 03/26/23 1328  GLUCAP 92    Discharge time spent: greater than 30 minutes.  Signed: Tyrone Nine, MD [facility] 03/28/2023

## 2023-07-01 DIAGNOSIS — Z008 Encounter for other general examination: Secondary | ICD-10-CM | POA: Diagnosis not present

## 2023-07-01 DIAGNOSIS — Z87891 Personal history of nicotine dependence: Secondary | ICD-10-CM | POA: Diagnosis not present

## 2023-07-01 DIAGNOSIS — Z6831 Body mass index (BMI) 31.0-31.9, adult: Secondary | ICD-10-CM | POA: Diagnosis not present

## 2023-07-01 DIAGNOSIS — Z880 Allergy status to penicillin: Secondary | ICD-10-CM | POA: Diagnosis not present

## 2023-07-01 DIAGNOSIS — I10 Essential (primary) hypertension: Secondary | ICD-10-CM | POA: Diagnosis not present

## 2023-07-01 DIAGNOSIS — E669 Obesity, unspecified: Secondary | ICD-10-CM | POA: Diagnosis not present

## 2023-07-01 DIAGNOSIS — Z87892 Personal history of anaphylaxis: Secondary | ICD-10-CM | POA: Diagnosis not present

## 2023-07-01 DIAGNOSIS — F411 Generalized anxiety disorder: Secondary | ICD-10-CM | POA: Diagnosis not present

## 2023-07-01 DIAGNOSIS — Z8249 Family history of ischemic heart disease and other diseases of the circulatory system: Secondary | ICD-10-CM | POA: Diagnosis not present

## 2023-07-01 DIAGNOSIS — E875 Hyperkalemia: Secondary | ICD-10-CM | POA: Diagnosis not present

## 2023-07-01 DIAGNOSIS — F1021 Alcohol dependence, in remission: Secondary | ICD-10-CM | POA: Diagnosis not present

## 2023-07-01 DIAGNOSIS — Z91038 Other insect allergy status: Secondary | ICD-10-CM | POA: Diagnosis not present

## 2023-07-01 DIAGNOSIS — G621 Alcoholic polyneuropathy: Secondary | ICD-10-CM | POA: Diagnosis not present

## 2023-07-08 DIAGNOSIS — N1832 Chronic kidney disease, stage 3b: Secondary | ICD-10-CM | POA: Diagnosis not present

## 2023-07-08 DIAGNOSIS — I10 Essential (primary) hypertension: Secondary | ICD-10-CM | POA: Diagnosis not present

## 2023-07-08 DIAGNOSIS — E875 Hyperkalemia: Secondary | ICD-10-CM | POA: Diagnosis not present

## 2023-07-08 DIAGNOSIS — F1020 Alcohol dependence, uncomplicated: Secondary | ICD-10-CM | POA: Diagnosis not present

## 2023-07-30 DIAGNOSIS — G4731 Primary central sleep apnea: Secondary | ICD-10-CM | POA: Diagnosis not present

## 2023-08-28 DIAGNOSIS — G4731 Primary central sleep apnea: Secondary | ICD-10-CM | POA: Diagnosis not present

## 2023-09-17 ENCOUNTER — Other Ambulatory Visit: Payer: Medicare Other

## 2023-09-17 DIAGNOSIS — C61 Malignant neoplasm of prostate: Secondary | ICD-10-CM | POA: Diagnosis not present

## 2023-09-18 LAB — PSA, TOTAL AND FREE
PSA, Free Pct: 45 %
PSA, Free: 0.18 ng/mL
Prostate Specific Ag, Serum: 0.4 ng/mL (ref 0.0–4.0)

## 2023-09-24 ENCOUNTER — Encounter: Payer: Self-pay | Admitting: Urology

## 2023-09-24 ENCOUNTER — Ambulatory Visit: Payer: Medicare Other | Admitting: Urology

## 2023-09-24 VITALS — BP 117/76 | HR 85

## 2023-09-24 DIAGNOSIS — C61 Malignant neoplasm of prostate: Secondary | ICD-10-CM

## 2023-09-24 DIAGNOSIS — N401 Enlarged prostate with lower urinary tract symptoms: Secondary | ICD-10-CM | POA: Diagnosis not present

## 2023-09-24 DIAGNOSIS — R351 Nocturia: Secondary | ICD-10-CM | POA: Diagnosis not present

## 2023-09-24 DIAGNOSIS — N138 Other obstructive and reflux uropathy: Secondary | ICD-10-CM

## 2023-09-24 NOTE — Progress Notes (Signed)
 09/24/2023 3:18 PM   Tony Patterson 09/12/1955 [id number]  Referring provider: Artemisa Bile, MD [address] [place],  [place] [address]  Followup BPH   HPI: Tony Patterson is a 20JY here for followup for prostate cancer and BPH. PSA decreased to 0.4. IPSS 3 QOL 0 after TURP. Urine stream strong. No straining to urinate. Nocturia 0-1x. NO other complaints today   PMH: Past Medical History:  Diagnosis Date   Alcohol abuse    Anemia    Anxiety    Hypertension    Neuropathy    Neuropathy    Seizures (HCC)    unknown etiology and on no meds; been 4 years since seizure.   Sleep apnea     Surgical History: Past Surgical History:  Procedure Laterality Date   COLONOSCOPY  05/18/2007   NWG:[id number] hemorrhoids, a single anal papilla, otherwise normal/ Single polyp, as described above, removed    COLONOSCOPY WITH PROPOFOL  N/A 03/16/2015   Procedure: COLONOSCOPY WITH PROPOFOL ;  Surgeon: Suzette Espy, MD;  Location: [facility];  Service: Endoscopy;  Laterality: N/A;  in cecum at 1344. withdrawal time    CYSTOSCOPY WITH INSERTION OF UROLIFT N/A 03/05/2021   Procedure: CYSTOSCOPY WITH INSERTION OF UROLIFT;  Surgeon: Marco Severs, MD;  Location: [facility];  Service: Urology;  Laterality: N/A;   THROAT SURGERY     polyps removed x2   TRANSURETHRAL RESECTION OF PROSTATE N/A 12/12/2022   Procedure: TRANSURETHRAL RESECTION OF THE PROSTATE (TURP);  Surgeon: Marco Severs, MD;  Location: [facility];  Service: Urology;  Laterality: N/A;    Home Medications:  Allergies as of 09/24/2023       Reactions   Bee Venom Anaphylaxis   Penicillins Other (See Comments)   Childhood allergic reaction - no other information available        Medication List        Accurate as of September 24, 2023  3:18 PM. If you have any questions, ask your nurse or doctor.          acetaminophen  500 MG tablet Commonly known as: TYLENOL  Take 1,000 mg by mouth every 6 (six) hours as needed for  moderate pain.   aspirin  EC 81 MG tablet Take 81 mg by mouth at bedtime.   CLEAR EYES OP Place 1 drop into both eyes daily as needed (irritation/dry eyes).   EPINEPHrine 0.3 mg/0.3 mL Soaj injection Commonly known as: EPI-PEN Inject 0.3 mg into the muscle as needed for anaphylaxis.   gabapentin  600 MG tablet Commonly known as: NEURONTIN  TAKE TWO TABLETS (1200MG ) BY MOUTH THREETIMES A DAY AS NEEDED What changed: See the new instructions.   LORazepam  1 MG tablet Commonly known as: ATIVAN  Take 1 mg by mouth daily as needed for anxiety.   Lumify  0.025 % Soln Generic drug: Brimonidine  Tartrate Place 1 drop into both eyes daily as needed (redness).   magnesium  oxide 400 MG tablet Commonly known as: MAG-OX Take 400 mg by mouth daily.   Melatonin 10 MG Caps Take 10 mg by mouth at bedtime as needed (sleep).   metoprolol  tartrate 50 MG tablet Commonly known as: LOPRESSOR  Take 50 mg by mouth 2 (two) times daily.   multivitamin with minerals Tabs tablet Take 1 tablet by mouth daily.   omeprazole 20 MG capsule Commonly known as: PRILOSEC Take 20 mg by mouth daily.   SYSTANE OP Place 1 drop into both eyes daily as needed (redness).   Veltassa  8.4 g packet Generic drug:  patiromer  Take 8.4 g by mouth every Monday, Wednesday, and Friday.        Allergies:  Allergies  Allergen Reactions   Bee Venom Anaphylaxis   Penicillins Other (See Comments)    Childhood allergic reaction - no other information available    Family History: Family History  Problem Relation Age of Onset   Fibromyalgia Mother    Dementia Mother    Congestive Heart Failure Mother    Congestive Heart Failure Father    Coronary artery disease Father 62       CABG 3 different times   Hypertension Brother    CAD Brother 58   Hypertension Brother    CAD Brother 31   Neuropathy Neg Hx    Colon cancer Neg Hx     Social History:  reports that he quit smoking about 28 years ago. His smoking use  included cigarettes. He started smoking about 48 years ago. He has a 10 pack-year smoking history. He has never used smokeless tobacco. He reports current alcohol use. He reports that he does not use drugs.  ROS: All other review of systems were reviewed and are negative except what is noted above in HPI  Physical Exam: BP 117/76   Pulse 85   Constitutional:  Alert and oriented, No acute distress. HEENT: [place] AT, moist mucus membranes.  Trachea midline, no masses. Cardiovascular: No clubbing, cyanosis, or edema. Respiratory: Normal respiratory effort, no increased work of breathing. GI: Abdomen is soft, nontender, nondistended, no abdominal masses GU: No CVA tenderness.  Lymph: No cervical or inguinal lymphadenopathy. Skin: No rashes, bruises or suspicious lesions. Neurologic: Grossly intact, no focal deficits, moving all 4 extremities. Psychiatric: Normal mood and affect.  Laboratory Data: Lab Results  Component Value Date   WBC 6.3 03/27/2023   HGB 12.5 (L) 03/27/2023   HCT 40.0 03/27/2023   MCV 91.1 03/27/2023   PLT 180 03/27/2023    Lab Results  Component Value Date   CREATININE 1.45 (H) 03/27/2023    No results found for: "PSA"  No results found for: "TESTOSTERONE"  Lab Results  Component Value Date   HGBA1C 5.6 08/24/2015    Urinalysis    Component Value Date/Time   COLORURINE YELLOW 03/26/2023 1241   APPEARANCEUR CLEAR 03/26/2023 1241   APPEARANCEUR Clear 03/19/2023 1325   LABSPEC 1.015 03/26/2023 1241   PHURINE 5.0 03/26/2023 1241   GLUCOSEU NEGATIVE 03/26/2023 1241   HGBUR MODERATE (A) 03/26/2023 1241   BILIRUBINUR NEGATIVE 03/26/2023 1241   BILIRUBINUR Negative 03/19/2023 1325   KETONESUR 5 (A) 03/26/2023 1241   PROTEINUR >=300 (A) 03/26/2023 1241   UROBILINOGEN 0.2 05/06/2013 1015   NITRITE NEGATIVE 03/26/2023 1241   LEUKOCYTESUR NEGATIVE 03/26/2023 1241    Lab Results  Component Value Date   LABMICR See below: 03/19/2023   WBCUA 0-5  03/19/2023   LABEPIT 0-10 03/19/2023   MUCUS Present 10/31/2021   BACTERIA NONE SEEN 03/26/2023    Pertinent Imaging:  No results found for this or any previous visit.  No results found for this or any previous visit.  No results found for this or any previous visit.  No results found for this or any previous visit.  No results found for this or any previous visit.  No results found for this or any previous visit.  No results found for this or any previous visit.  No results found for this or any previous visit.   Assessment & Plan:    1. Malignant neoplasm of  prostate (HCC) (Primary) Followup 6 months with PSA - Urinalysis, Routine w reflex microscopic  2. Benign prostatic hyperplasia with urinary obstruction Improved after TURP  3. Nocturia -decrease fluid intake within 2 hours of going to bed   No follow-ups on file.  Johnie Nailer, MD  [facility] [place]

## 2023-09-24 NOTE — Patient Instructions (Signed)

## 2023-10-02 DIAGNOSIS — I10 Essential (primary) hypertension: Secondary | ICD-10-CM | POA: Diagnosis not present

## 2023-10-02 DIAGNOSIS — N1832 Chronic kidney disease, stage 3b: Secondary | ICD-10-CM | POA: Diagnosis not present

## 2023-10-02 DIAGNOSIS — Z79899 Other long term (current) drug therapy: Secondary | ICD-10-CM | POA: Diagnosis not present

## 2023-10-02 DIAGNOSIS — F1020 Alcohol dependence, uncomplicated: Secondary | ICD-10-CM | POA: Diagnosis not present

## 2023-10-02 DIAGNOSIS — N529 Male erectile dysfunction, unspecified: Secondary | ICD-10-CM | POA: Diagnosis not present

## 2023-10-02 DIAGNOSIS — E875 Hyperkalemia: Secondary | ICD-10-CM | POA: Diagnosis not present

## 2023-10-09 DIAGNOSIS — F1010 Alcohol abuse, uncomplicated: Secondary | ICD-10-CM | POA: Diagnosis not present

## 2023-10-09 DIAGNOSIS — E875 Hyperkalemia: Secondary | ICD-10-CM | POA: Diagnosis not present

## 2023-10-09 DIAGNOSIS — N1832 Chronic kidney disease, stage 3b: Secondary | ICD-10-CM | POA: Diagnosis not present

## 2023-11-26 DIAGNOSIS — G4731 Primary central sleep apnea: Secondary | ICD-10-CM | POA: Diagnosis not present

## 2024-01-15 DIAGNOSIS — E875 Hyperkalemia: Secondary | ICD-10-CM | POA: Diagnosis not present

## 2024-01-15 DIAGNOSIS — N529 Male erectile dysfunction, unspecified: Secondary | ICD-10-CM | POA: Diagnosis not present

## 2024-01-15 DIAGNOSIS — Z79899 Other long term (current) drug therapy: Secondary | ICD-10-CM | POA: Diagnosis not present

## 2024-01-15 DIAGNOSIS — N1832 Chronic kidney disease, stage 3b: Secondary | ICD-10-CM | POA: Diagnosis not present

## 2024-01-15 DIAGNOSIS — F1020 Alcohol dependence, uncomplicated: Secondary | ICD-10-CM | POA: Diagnosis not present

## 2024-01-15 DIAGNOSIS — I10 Essential (primary) hypertension: Secondary | ICD-10-CM | POA: Diagnosis not present

## 2024-01-22 DIAGNOSIS — E875 Hyperkalemia: Secondary | ICD-10-CM | POA: Diagnosis not present

## 2024-01-22 DIAGNOSIS — Z79899 Other long term (current) drug therapy: Secondary | ICD-10-CM | POA: Diagnosis not present

## 2024-01-22 DIAGNOSIS — F1010 Alcohol abuse, uncomplicated: Secondary | ICD-10-CM | POA: Diagnosis not present

## 2024-01-22 DIAGNOSIS — N1832 Chronic kidney disease, stage 3b: Secondary | ICD-10-CM | POA: Diagnosis not present

## 2024-01-27 ENCOUNTER — Encounter (INDEPENDENT_AMBULATORY_CARE_PROVIDER_SITE_OTHER): Payer: Self-pay | Admitting: *Deleted

## 2024-02-04 ENCOUNTER — Encounter (INDEPENDENT_AMBULATORY_CARE_PROVIDER_SITE_OTHER): Payer: Self-pay | Admitting: *Deleted

## 2024-03-24 ENCOUNTER — Other Ambulatory Visit

## 2024-03-24 DIAGNOSIS — C61 Malignant neoplasm of prostate: Secondary | ICD-10-CM | POA: Diagnosis not present

## 2024-03-25 LAB — PSA, TOTAL AND FREE
PSA, Free Pct: 45 %
PSA, Free: 0.18 ng/mL
Prostate Specific Ag, Serum: 0.4 ng/mL (ref 0.0–4.0)

## 2024-03-30 ENCOUNTER — Ambulatory Visit: Payer: Self-pay | Admitting: Urology

## 2024-03-31 ENCOUNTER — Ambulatory Visit: Admitting: Urology

## 2024-03-31 ENCOUNTER — Encounter: Payer: Self-pay | Admitting: Urology

## 2024-03-31 VITALS — BP 173/89 | HR 73

## 2024-03-31 DIAGNOSIS — R351 Nocturia: Secondary | ICD-10-CM

## 2024-03-31 DIAGNOSIS — N138 Other obstructive and reflux uropathy: Secondary | ICD-10-CM | POA: Diagnosis not present

## 2024-03-31 DIAGNOSIS — N401 Enlarged prostate with lower urinary tract symptoms: Secondary | ICD-10-CM

## 2024-03-31 DIAGNOSIS — C61 Malignant neoplasm of prostate: Secondary | ICD-10-CM | POA: Diagnosis not present

## 2024-03-31 NOTE — Addendum Note (Signed)
 Addended by: Mazella Deen L on: 03/31/2024 02:35 PM   Modules accepted: Orders

## 2024-03-31 NOTE — Progress Notes (Signed)
 03/31/2024 2:30 PM   Tony Patterson [birth year] [id number]  Referring provider: Sheryle Carwin, MD [address] [place],  [place] [address]  Followup BPh and prostate cancer   HPI: Tony Patterson is a 68yo here for followup for prostate cancer and BPH. PSA stable at 0.4. IPSS 4 QOL 0 after TURP. Urine stream strong. No straining to urinate. Nocturia 1-2x depending on coffee consumption. No dysuria or hematuria. No UTI since last visit.    PMH: Past Medical History:  Diagnosis Date   Alcohol abuse    Anemia    Anxiety    Hypertension    Neuropathy    Neuropathy    Seizures (HCC)    unknown etiology and on no meds; been 4 years since seizure.   Sleep apnea     Surgical History: Past Surgical History:  Procedure Laterality Date   COLONOSCOPY  05/18/2007   MFM:Pwuzmwjo hemorrhoids, a single anal papilla, otherwise normal/ Single polyp, as described above, removed    COLONOSCOPY WITH PROPOFOL  N/A 03/16/2015   Procedure: COLONOSCOPY WITH PROPOFOL ;  Surgeon: Tony CHRISTELLA Hollingshead, MD;  Location: [facility];  Service: Endoscopy;  Laterality: N/A;  in cecum at 1344. withdrawal time    CYSTOSCOPY WITH INSERTION OF UROLIFT N/A 03/05/2021   Procedure: CYSTOSCOPY WITH INSERTION OF UROLIFT;  Surgeon: Tony Belvie CROME, MD;  Location: [facility];  Service: Urology;  Laterality: N/A;   THROAT SURGERY     polyps removed x2   TRANSURETHRAL RESECTION OF PROSTATE N/A 12/12/2022   Procedure: TRANSURETHRAL RESECTION OF THE PROSTATE (TURP);  Surgeon: Tony Belvie CROME, MD;  Location: [facility];  Service: Urology;  Laterality: N/A;    Home Medications:  Allergies as of 03/31/2024       Reactions   Bee Venom Anaphylaxis   Penicillins Other (See Comments)   Childhood allergic reaction - no other information available        Medication List        Accurate as of March 31, 2024  2:30 PM. If you have any questions, ask your nurse or doctor.          acetaminophen  500 MG tablet Commonly  known as: TYLENOL  Take 1,000 mg by mouth every 6 (six) hours as needed for moderate pain.   aspirin  EC 81 MG tablet Take 81 mg by mouth at bedtime.   CLEAR EYES OP Place 1 drop into both eyes daily as needed (irritation/dry eyes).   EPINEPHrine 0.3 mg/0.3 mL Soaj injection Commonly known as: EPI-PEN Inject 0.3 mg into the muscle as needed for anaphylaxis.   gabapentin  600 MG tablet Commonly known as: NEURONTIN  TAKE TWO TABLETS (1200MG ) BY MOUTH THREETIMES A DAY AS NEEDED What changed: See the new instructions.   LORazepam  1 MG tablet Commonly known as: ATIVAN  Take 1 mg by mouth daily as needed for anxiety.   Lumify  0.025 % Soln Generic drug: Brimonidine  Tartrate Place 1 drop into both eyes daily as needed (redness).   magnesium  oxide 400 MG tablet Commonly known as: MAG-OX Take 400 mg by mouth daily.   Melatonin 10 MG Caps Take 10 mg by mouth at bedtime as needed (sleep).   metoprolol  tartrate 50 MG tablet Commonly known as: LOPRESSOR  Take 50 mg by mouth 2 (two) times daily.   multivitamin with minerals Tabs tablet Take 1 tablet by mouth daily.   omeprazole 20 MG capsule Commonly known as: PRILOSEC Take 20 mg by mouth daily.   SYSTANE OP Place 1 drop into both  eyes daily as needed (redness).   Veltassa  8.4 g packet Generic drug: patiromer  Take 8.4 g by mouth every Monday, Wednesday, and Friday.        Allergies:  Allergies  Allergen Reactions   Bee Venom Anaphylaxis   Penicillins Other (See Comments)    Childhood allergic reaction - no other information available    Family History: Family History  Problem Relation Age of Onset   Fibromyalgia Mother    Dementia Mother    Congestive Heart Failure Mother    Congestive Heart Failure Father    Coronary artery disease Father 51       CABG 3 different times   Hypertension Brother    CAD Brother 36   Hypertension Brother    CAD Brother 29   Neuropathy Neg Hx    Colon cancer Neg Hx     Social  History:  reports that he quit smoking about 28 years ago. His smoking use included cigarettes. He started smoking about 48 years ago. He has a 10 pack-year smoking history. He has never used smokeless tobacco. He reports current alcohol use. He reports that he does not use drugs.  ROS: All other review of systems were reviewed and are negative except what is noted above in HPI  Physical Exam: BP (!) 173/89   Pulse 73   Constitutional:  Alert and oriented, No acute distress. HEENT: [place] AT, moist mucus membranes.  Trachea midline, no masses. Cardiovascular: No clubbing, cyanosis, or edema. Respiratory: Normal respiratory effort, no increased work of breathing. GI: Abdomen is soft, nontender, nondistended, no abdominal masses GU: No CVA tenderness.  Lymph: No cervical or inguinal lymphadenopathy. Skin: No rashes, bruises or suspicious lesions. Neurologic: Grossly intact, no focal deficits, moving all 4 extremities. Psychiatric: Normal mood and affect.  Laboratory Data: Lab Results  Component Value Date   WBC 6.3 03/27/2023   HGB 12.5 (L) 03/27/2023   HCT 40.0 03/27/2023   MCV 91.1 03/27/2023   PLT 180 03/27/2023    Lab Results  Component Value Date   CREATININE 1.45 (H) 03/27/2023    No results found for: PSA  No results found for: TESTOSTERONE  Lab Results  Component Value Date   HGBA1C 5.6 08/24/2015    Urinalysis    Component Value Date/Time   COLORURINE YELLOW 03/26/2023 1241   APPEARANCEUR CLEAR 03/26/2023 1241   APPEARANCEUR Clear 03/19/2023 1325   LABSPEC 1.015 03/26/2023 1241   PHURINE 5.0 03/26/2023 1241   GLUCOSEU NEGATIVE 03/26/2023 1241   HGBUR MODERATE (A) 03/26/2023 1241   BILIRUBINUR NEGATIVE 03/26/2023 1241   BILIRUBINUR Negative 03/19/2023 1325   KETONESUR 5 (A) 03/26/2023 1241   PROTEINUR >=300 (A) 03/26/2023 1241   UROBILINOGEN 0.2 05/06/2013 1015   NITRITE NEGATIVE 03/26/2023 1241   LEUKOCYTESUR NEGATIVE 03/26/2023 1241    Lab Results   Component Value Date   LABMICR See below: 03/19/2023   WBCUA 0-5 03/19/2023   LABEPIT 0-10 03/19/2023   MUCUS Present 10/31/2021   BACTERIA NONE SEEN 03/26/2023    Pertinent Imaging:  No results found for this or any previous visit.  No results found for this or any previous visit.  No results found for this or any previous visit.  No results found for this or any previous visit.  No results found for this or any previous visit.  No results found for this or any previous visit.  No results found for this or any previous visit.  No results found for this or any  previous visit.   Assessment & Plan:    1. Malignant neoplasm of prostate (HCC) (Primary) -followup 1year with PSA - Urinalysis, Routine w reflex microscopic  2. Benign prostatic hyperplasia with urinary obstruction Improved after TURP  3. Nocturia Improved after TURP   No follow-ups on file.  Belvie Clara, MD  [facility] [place]

## 2024-03-31 NOTE — Patient Instructions (Signed)

## 2024-04-01 LAB — URINALYSIS, ROUTINE W REFLEX MICROSCOPIC
Bilirubin, UA: NEGATIVE
Glucose, UA: NEGATIVE
Ketones, UA: NEGATIVE
Leukocytes,UA: NEGATIVE
Nitrite, UA: NEGATIVE
RBC, UA: NEGATIVE
Specific Gravity, UA: 1.01 (ref 1.005–1.030)
Urobilinogen, Ur: 0.2 mg/dL (ref 0.2–1.0)
pH, UA: 6.5 (ref 5.0–7.5)

## 2024-04-01 LAB — MICROSCOPIC EXAMINATION
Bacteria, UA: NONE SEEN
WBC, UA: NONE SEEN /HPF (ref 0–5)

## 2024-04-07 DIAGNOSIS — E785 Hyperlipidemia, unspecified: Secondary | ICD-10-CM | POA: Diagnosis not present

## 2024-04-07 DIAGNOSIS — Z79899 Other long term (current) drug therapy: Secondary | ICD-10-CM | POA: Diagnosis not present

## 2024-04-14 DIAGNOSIS — Z23 Encounter for immunization: Secondary | ICD-10-CM | POA: Diagnosis not present

## 2024-04-14 DIAGNOSIS — E785 Hyperlipidemia, unspecified: Secondary | ICD-10-CM | POA: Diagnosis not present

## 2024-04-14 DIAGNOSIS — N183 Chronic kidney disease, stage 3 unspecified: Secondary | ICD-10-CM | POA: Diagnosis not present

## 2024-04-14 DIAGNOSIS — E875 Hyperkalemia: Secondary | ICD-10-CM | POA: Diagnosis not present

## 2024-04-14 DIAGNOSIS — R800 Isolated proteinuria: Secondary | ICD-10-CM | POA: Diagnosis not present

## 2024-04-14 DIAGNOSIS — C61 Malignant neoplasm of prostate: Secondary | ICD-10-CM | POA: Diagnosis not present

## 2024-04-14 DIAGNOSIS — I10 Essential (primary) hypertension: Secondary | ICD-10-CM | POA: Diagnosis not present

## 2024-05-13 ENCOUNTER — Encounter: Payer: Self-pay | Admitting: Internal Medicine

## 2024-06-07 ENCOUNTER — Telehealth (INDEPENDENT_AMBULATORY_CARE_PROVIDER_SITE_OTHER): Payer: Self-pay

## 2024-06-07 ENCOUNTER — Ambulatory Visit (INDEPENDENT_AMBULATORY_CARE_PROVIDER_SITE_OTHER): Admitting: Internal Medicine

## 2024-06-07 ENCOUNTER — Encounter: Payer: Self-pay | Admitting: Internal Medicine

## 2024-06-07 VITALS — BP 146/77 | HR 71 | Temp 97.5°F | Ht 75.0 in | Wt 259.8 lb

## 2024-06-07 DIAGNOSIS — R197 Diarrhea, unspecified: Secondary | ICD-10-CM

## 2024-06-07 DIAGNOSIS — K219 Gastro-esophageal reflux disease without esophagitis: Secondary | ICD-10-CM

## 2024-06-07 DIAGNOSIS — Z860101 Personal history of adenomatous and serrated colon polyps: Secondary | ICD-10-CM | POA: Diagnosis not present

## 2024-06-07 MED ORDER — PEG 3350-KCL-NA BICARB-NACL 420 G PO SOLR: 4000.0000 mL | Freq: Once | ORAL | 0 refills | Status: AC

## 2024-06-07 NOTE — Patient Instructions (Signed)
 We will schedule you for colonoscopy given your history of polyps prior.  Continue on omeprazole daily for your chronic acid reflux.  It was very nice meeting you today.  Dr. Cindie

## 2024-06-07 NOTE — H&P (View-Only) (Signed)
 "   Primary Care Physician:  Sheryle Carwin, MD Primary Gastroenterologist:  Dr. Cindie  Chief Complaint  Patient presents with   Diarrhea    Pt arrives due to diarrhea. Pt states he had diarrhea last week, is doing better this week. Stopped coffee at night.     HPI:   Tony Patterson is a 69 y.o. male who presents to clinic today by referral from his PCP Dr. Sheryle for evaluation.  Chronic GERD: Well-controlled on omeprazole 20 mg daily.  Denies any dysphagia or odynophagia.  No epigastric or chest pain.  History of colon polyp: Colonoscopy 03/16/2015 unremarkable.  Colonoscopy prior 05/18/2007 found 8 mm sessile polyp, internal hemorrhoids, single anal papilla. Surgical pathology found tubulovillous adenoma.   Denies any family history of colorectal malignancy.  No melena or hematochezia.  No abdominal pain or unintentional weight loss.  Diarrhea: Prior issue, this has resolved for the most part.  States he stopped drinking coffee before bedtime and his stools are now formed.  Past Medical History:  Diagnosis Date   Alcohol abuse    Anemia    Anxiety    Hypertension    Neuropathy    Neuropathy    Seizures (HCC)    unknown etiology and on no meds; been 4 years since seizure.   Sleep apnea     Past Surgical History:  Procedure Laterality Date   COLONOSCOPY  05/18/2007   MFM:Pwuzmwjo hemorrhoids, a single anal papilla, otherwise normal/ Single polyp, as described above, removed    COLONOSCOPY WITH PROPOFOL  N/A 03/16/2015   Procedure: COLONOSCOPY WITH PROPOFOL ;  Surgeon: Lamar CHRISTELLA Hollingshead, MD;  Location: [facility];  Service: Endoscopy;  Laterality: N/A;  in cecum at 1344. withdrawal time    CYSTOSCOPY WITH INSERTION OF UROLIFT N/A 03/05/2021   Procedure: CYSTOSCOPY WITH INSERTION OF UROLIFT;  Surgeon: Sherrilee Belvie CROME, MD;  Location: [facility];  Service: Urology;  Laterality: N/A;   THROAT SURGERY     polyps removed x2   TRANSURETHRAL RESECTION OF PROSTATE N/A 12/12/2022    Procedure: TRANSURETHRAL RESECTION OF THE PROSTATE (TURP);  Surgeon: Sherrilee Belvie CROME, MD;  Location: [facility];  Service: Urology;  Laterality: N/A;    Current Outpatient Medications  Medication Sig Dispense Refill   acetaminophen  (TYLENOL ) 500 MG tablet Take 1,000 mg by mouth every 6 (six) hours as needed for moderate pain.     aspirin  EC 81 MG tablet Take 81 mg by mouth at bedtime.     Brimonidine  Tartrate (LUMIFY ) 0.025 % SOLN Place 1 drop into both eyes daily as needed (redness).     EPINEPHrine 0.3 mg/0.3 mL IJ SOAJ injection Inject 0.3 mg into the muscle as needed for anaphylaxis.     gabapentin  (NEURONTIN ) 600 MG tablet TAKE TWO TABLETS (1200MG ) BY MOUTH THREETIMES A DAY AS NEEDED (Patient taking differently: Take 600 mg by mouth 2 (two) times daily.) 540 tablet 0   LOKELMA 10 g PACK packet Take 1 packet by mouth daily.     LORazepam  (ATIVAN ) 1 MG tablet Take 1 mg by mouth daily as needed for anxiety.     magnesium  oxide (MAG-OX) 400 MG tablet Take 400 mg by mouth daily.     Melatonin 10 MG CAPS Take 10 mg by mouth at bedtime as needed (sleep).     metoprolol  (LOPRESSOR ) 50 MG tablet Take 50 mg by mouth 2 (two) times daily.      Multiple Vitamin (MULTIVITAMIN WITH MINERALS) TABS tablet Take 1 tablet by mouth  daily.     Naphazoline HCl (CLEAR EYES OP) Place 1 drop into both eyes daily as needed (irritation/dry eyes).     omeprazole (PRILOSEC) 20 MG capsule Take 20 mg by mouth daily.      Polyethyl Glycol-Propyl Glycol (SYSTANE OP) Place 1 drop into both eyes daily as needed (redness).     VELTASSA  8.4 g packet Take 8.4 g by mouth every Monday, Wednesday, and Friday. (Patient not taking: Reported on 06/07/2024)     Current Facility-Administered Medications  Medication Dose Route Frequency Provider Last Rate Last Admin   betamethasone  acetate-betamethasone  sodium phosphate (CELESTONE ) injection 12 mg  12 mg Other Once Eldonna Novel, MD        Allergies as of 06/07/2024 - Review  Complete 06/07/2024  Allergen Reaction Noted   Bee venom Anaphylaxis 10/08/2011   Penicillins Other (See Comments) 10/08/2011    Family History  Problem Relation Age of Onset   Fibromyalgia Mother    Dementia Mother    Congestive Heart Failure Mother    Congestive Heart Failure Father    Coronary artery disease Father 17       CABG 3 different times   Hypertension Brother    CAD Brother 72   Hypertension Brother    CAD Brother 41   Neuropathy Neg Hx    Colon cancer Neg Hx     Social History   Socioeconomic History   Marital status: Legally Separated    Spouse name: Not on file   Number of children: 0   Years of education: college 1   Highest education level: Not on file  Occupational History   Occupation: Writer: [employer]    Comment: Neurosurgeon  Tobacco Use   Smoking status: Former    Current packs/day: 0.00    Average packs/day: 0.5 packs/day for 20.0 years (10.0 ttl pk-yrs)    Types: Cigarettes    Start date: 06/03/1975    Quit date: 06/03/1995    Years since quitting: 29.0   Smokeless tobacco: Never  Vaping Use   Vaping status: Never Used  Substance and Sexual Activity   Alcohol use: Yes    Comment: none in 2 months as of 11/26/2018, 1/2 gallon liquor 2024   Drug use: No   Sexual activity: Never    Birth control/protection: None  Other Topics Concern   Not on file  Social History Narrative   Left-handed   Caffeine use: 1 large cup of coffee in the morning, Coke (caffeine free) ocass   Social Drivers of Health   Tobacco Use: Medium Risk (06/07/2024)   Patient History    Smoking Tobacco Use: Former    Smokeless Tobacco Use: Never    Passive Exposure: Not on Actuary Strain: Not on file  Food Insecurity: No Food Insecurity (03/26/2023)   Hunger Vital Sign    Worried About Running Out of Food in the Last Year: Never true    Ran Out of Food in the Last Year: Never true  Transportation Needs: No  Transportation Needs (03/26/2023)   PRAPARE - Administrator, Civil Service (Medical): No    Lack of Transportation (Non-Medical): No  Physical Activity: Not on file  Stress: Not on file  Social Connections: Not on file  Intimate Partner Violence: Not At Risk (03/26/2023)   Humiliation, Afraid, Rape, and Kick questionnaire    Fear of Current or Ex-Partner: No    Emotionally Abused: No  Physically Abused: No    Sexually Abused: No  Depression (PHQ2-9): Not on file  Alcohol Screen: Not on file  Housing: Patient Declined (03/26/2023)   Housing    Last Housing Risk Score: 0  Utilities: Not At Risk (03/26/2023)   [facility] Utilities    Threatened with loss of utilities: No  Health Literacy: Not on file    Subjective: Review of Systems  Constitutional:  Negative for chills and fever.  HENT:  Negative for congestion and hearing loss.   Eyes:  Negative for blurred vision and double vision.  Respiratory:  Negative for cough and shortness of breath.   Cardiovascular:  Negative for chest pain and palpitations.  Gastrointestinal:  Negative for abdominal pain, blood in stool, constipation, diarrhea, heartburn, melena and vomiting.  Genitourinary:  Negative for dysuria and urgency.  Musculoskeletal:  Negative for joint pain and myalgias.  Skin:  Negative for itching and rash.  Neurological:  Negative for dizziness and headaches.  Psychiatric/Behavioral:  Negative for depression. The patient is not nervous/anxious.        Objective: BP (!) 143/83   Pulse 71   Temp (!) 97.5 F (36.4 C)   Ht 6' 3 (1.905 m)   Wt 259 lb 12.8 oz (117.8 kg)   BMI 32.47 kg/m  Physical Exam Constitutional:      Appearance: Normal appearance.  HENT:     Head: Normocephalic and atraumatic.  Eyes:     Extraocular Movements: Extraocular movements intact.     Conjunctiva/sclera: Conjunctivae normal.  Cardiovascular:     Rate and Rhythm: Normal rate and regular rhythm.  Pulmonary:     Effort:  Pulmonary effort is normal.     Breath sounds: Normal breath sounds.  Abdominal:     General: Bowel sounds are normal.     Palpations: Abdomen is soft.  Musculoskeletal:        General: Normal range of motion.     Cervical back: Normal range of motion and neck supple.  Skin:    General: Skin is warm.  Neurological:     General: No focal deficit present.     Mental Status: He is alert and oriented to person, place, and time.  Psychiatric:        Mood and Affect: Mood normal.        Behavior: Behavior normal.      Assessment/Plan:  1.  History of adenomatous colon polyp- Will schedule for surveillance colonoscopy.The risks including infection, bleed, or perforation as well as benefits, limitations, alternatives and imponderables have been reviewed with the patient. Questions have been answered. All parties agreeable.  2.  Chronic GERD-well-controlled on omeprazole daily.  Will continue.  3.  Diarrhea-resolved with decreasing coffee consumption at night.  Continue to monitor.  Thank you Dr. Sheryle for the kind referral.  06/07/2024 9:13 AM    "

## 2024-06-07 NOTE — Telephone Encounter (Signed)
 Spoke with patient in the office, scheduled colonoscopy for 06/15/2024 at 9:30am. Rx sent to pharmacy. Instructions given to patient.

## 2024-06-07 NOTE — Telephone Encounter (Signed)
 PA not required on [employer] for TCS with [facility].

## 2024-06-07 NOTE — Progress Notes (Signed)
 "   Primary Care Physician:  Sheryle Carwin, MD Primary Gastroenterologist:  Dr. Cindie  Chief Complaint  Patient presents with   Diarrhea    Pt arrives due to diarrhea. Pt states he had diarrhea last week, is doing better this week. Stopped coffee at night.     HPI:   Tony Patterson is a 69 y.o. male who presents to clinic today by referral from his PCP Dr. Sheryle for evaluation.  Chronic GERD: Well-controlled on omeprazole 20 mg daily.  Denies any dysphagia or odynophagia.  No epigastric or chest pain.  History of colon polyp: Colonoscopy 03/16/2015 unremarkable.  Colonoscopy prior 05/18/2007 found 8 mm sessile polyp, internal hemorrhoids, single anal papilla. Surgical pathology found tubulovillous adenoma.   Denies any family history of colorectal malignancy.  No melena or hematochezia.  No abdominal pain or unintentional weight loss.  Diarrhea: Prior issue, this has resolved for the most part.  States he stopped drinking coffee before bedtime and his stools are now formed.  Past Medical History:  Diagnosis Date   Alcohol abuse    Anemia    Anxiety    Hypertension    Neuropathy    Neuropathy    Seizures (HCC)    unknown etiology and on no meds; been 4 years since seizure.   Sleep apnea     Past Surgical History:  Procedure Laterality Date   COLONOSCOPY  05/18/2007   MFM:Pwuzmwjo hemorrhoids, a single anal papilla, otherwise normal/ Single polyp, as described above, removed    COLONOSCOPY WITH PROPOFOL  N/A 03/16/2015   Procedure: COLONOSCOPY WITH PROPOFOL ;  Surgeon: Lamar CHRISTELLA Hollingshead, MD;  Location: [facility];  Service: Endoscopy;  Laterality: N/A;  in cecum at 1344. withdrawal time    CYSTOSCOPY WITH INSERTION OF UROLIFT N/A 03/05/2021   Procedure: CYSTOSCOPY WITH INSERTION OF UROLIFT;  Surgeon: Sherrilee Belvie CROME, MD;  Location: [facility];  Service: Urology;  Laterality: N/A;   THROAT SURGERY     polyps removed x2   TRANSURETHRAL RESECTION OF PROSTATE N/A 12/12/2022    Procedure: TRANSURETHRAL RESECTION OF THE PROSTATE (TURP);  Surgeon: Sherrilee Belvie CROME, MD;  Location: [facility];  Service: Urology;  Laterality: N/A;    Current Outpatient Medications  Medication Sig Dispense Refill   acetaminophen  (TYLENOL ) 500 MG tablet Take 1,000 mg by mouth every 6 (six) hours as needed for moderate pain.     aspirin  EC 81 MG tablet Take 81 mg by mouth at bedtime.     Brimonidine  Tartrate (LUMIFY ) 0.025 % SOLN Place 1 drop into both eyes daily as needed (redness).     EPINEPHrine 0.3 mg/0.3 mL IJ SOAJ injection Inject 0.3 mg into the muscle as needed for anaphylaxis.     gabapentin  (NEURONTIN ) 600 MG tablet TAKE TWO TABLETS (1200MG ) BY MOUTH THREETIMES A DAY AS NEEDED (Patient taking differently: Take 600 mg by mouth 2 (two) times daily.) 540 tablet 0   LOKELMA 10 g PACK packet Take 1 packet by mouth daily.     LORazepam  (ATIVAN ) 1 MG tablet Take 1 mg by mouth daily as needed for anxiety.     magnesium  oxide (MAG-OX) 400 MG tablet Take 400 mg by mouth daily.     Melatonin 10 MG CAPS Take 10 mg by mouth at bedtime as needed (sleep).     metoprolol  (LOPRESSOR ) 50 MG tablet Take 50 mg by mouth 2 (two) times daily.      Multiple Vitamin (MULTIVITAMIN WITH MINERALS) TABS tablet Take 1 tablet by mouth  daily.     Naphazoline HCl (CLEAR EYES OP) Place 1 drop into both eyes daily as needed (irritation/dry eyes).     omeprazole (PRILOSEC) 20 MG capsule Take 20 mg by mouth daily.      Polyethyl Glycol-Propyl Glycol (SYSTANE OP) Place 1 drop into both eyes daily as needed (redness).     VELTASSA  8.4 g packet Take 8.4 g by mouth every Monday, Wednesday, and Friday. (Patient not taking: Reported on 06/07/2024)     Current Facility-Administered Medications  Medication Dose Route Frequency Provider Last Rate Last Admin   betamethasone  acetate-betamethasone  sodium phosphate (CELESTONE ) injection 12 mg  12 mg Other Once Eldonna Novel, MD        Allergies as of 06/07/2024 - Review  Complete 06/07/2024  Allergen Reaction Noted   Bee venom Anaphylaxis 10/08/2011   Penicillins Other (See Comments) 10/08/2011    Family History  Problem Relation Age of Onset   Fibromyalgia Mother    Dementia Mother    Congestive Heart Failure Mother    Congestive Heart Failure Father    Coronary artery disease Father 17       CABG 3 different times   Hypertension Brother    CAD Brother 72   Hypertension Brother    CAD Brother 41   Neuropathy Neg Hx    Colon cancer Neg Hx     Social History   Socioeconomic History   Marital status: Legally Separated    Spouse name: Not on file   Number of children: 0   Years of education: college 1   Highest education level: Not on file  Occupational History   Occupation: Writer: [employer]    Comment: Neurosurgeon  Tobacco Use   Smoking status: Former    Current packs/day: 0.00    Average packs/day: 0.5 packs/day for 20.0 years (10.0 ttl pk-yrs)    Types: Cigarettes    Start date: 06/03/1975    Quit date: 06/03/1995    Years since quitting: 29.0   Smokeless tobacco: Never  Vaping Use   Vaping status: Never Used  Substance and Sexual Activity   Alcohol use: Yes    Comment: none in 2 months as of 11/26/2018, 1/2 gallon liquor 2024   Drug use: No   Sexual activity: Never    Birth control/protection: None  Other Topics Concern   Not on file  Social History Narrative   Left-handed   Caffeine use: 1 large cup of coffee in the morning, Coke (caffeine free) ocass   Social Drivers of Health   Tobacco Use: Medium Risk (06/07/2024)   Patient History    Smoking Tobacco Use: Former    Smokeless Tobacco Use: Never    Passive Exposure: Not on Actuary Strain: Not on file  Food Insecurity: No Food Insecurity (03/26/2023)   Hunger Vital Sign    Worried About Running Out of Food in the Last Year: Never true    Ran Out of Food in the Last Year: Never true  Transportation Needs: No  Transportation Needs (03/26/2023)   PRAPARE - Administrator, Civil Service (Medical): No    Lack of Transportation (Non-Medical): No  Physical Activity: Not on file  Stress: Not on file  Social Connections: Not on file  Intimate Partner Violence: Not At Risk (03/26/2023)   Humiliation, Afraid, Rape, and Kick questionnaire    Fear of Current or Ex-Partner: No    Emotionally Abused: No  Physically Abused: No    Sexually Abused: No  Depression (PHQ2-9): Not on file  Alcohol Screen: Not on file  Housing: Patient Declined (03/26/2023)   Housing    Last Housing Risk Score: 0  Utilities: Not At Risk (03/26/2023)   AHC Utilities    Threatened with loss of utilities: No  Health Literacy: Not on file    Subjective: Review of Systems  Constitutional:  Negative for chills and fever.  HENT:  Negative for congestion and hearing loss.   Eyes:  Negative for blurred vision and double vision.  Respiratory:  Negative for cough and shortness of breath.   Cardiovascular:  Negative for chest pain and palpitations.  Gastrointestinal:  Negative for abdominal pain, blood in stool, constipation, diarrhea, heartburn, melena and vomiting.  Genitourinary:  Negative for dysuria and urgency.  Musculoskeletal:  Negative for joint pain and myalgias.  Skin:  Negative for itching and rash.  Neurological:  Negative for dizziness and headaches.  Psychiatric/Behavioral:  Negative for depression. The patient is not nervous/anxious.        Objective: BP (!) 143/83   Pulse 71   Temp (!) 97.5 F (36.4 C)   Ht 6' 3 (1.905 m)   Wt 259 lb 12.8 oz (117.8 kg)   BMI 32.47 kg/m  Physical Exam Constitutional:      Appearance: Normal appearance.  HENT:     Head: Normocephalic and atraumatic.  Eyes:     Extraocular Movements: Extraocular movements intact.     Conjunctiva/sclera: Conjunctivae normal.  Cardiovascular:     Rate and Rhythm: Normal rate and regular rhythm.  Pulmonary:     Effort:  Pulmonary effort is normal.     Breath sounds: Normal breath sounds.  Abdominal:     General: Bowel sounds are normal.     Palpations: Abdomen is soft.  Musculoskeletal:        General: Normal range of motion.     Cervical back: Normal range of motion and neck supple.  Skin:    General: Skin is warm.  Neurological:     General: No focal deficit present.     Mental Status: He is alert and oriented to person, place, and time.  Psychiatric:        Mood and Affect: Mood normal.        Behavior: Behavior normal.      Assessment/Plan:  1.  History of adenomatous colon polyp- Will schedule for surveillance colonoscopy.The risks including infection, bleed, or perforation as well as benefits, limitations, alternatives and imponderables have been reviewed with the patient. Questions have been answered. All parties agreeable.  2.  Chronic GERD-well-controlled on omeprazole daily.  Will continue.  3.  Diarrhea-resolved with decreasing coffee consumption at night.  Continue to monitor.  Thank you Dr. Sheryle for the kind referral.  06/07/2024 9:13 AM    "

## 2024-06-07 NOTE — Telephone Encounter (Signed)
 [facility] does not require prior auth for TCS

## 2024-06-10 ENCOUNTER — Encounter (HOSPITAL_COMMUNITY): Payer: Self-pay

## 2024-06-10 ENCOUNTER — Encounter (HOSPITAL_COMMUNITY)
Admission: RE | Admit: 2024-06-10 | Discharge: 2024-06-10 | Disposition: A | Discharge status: Home / Self Care | Source: Ambulatory Visit | Attending: Internal Medicine | Admitting: Internal Medicine

## 2024-06-15 ENCOUNTER — Ambulatory Visit (HOSPITAL_COMMUNITY)
Admission: RE | Admit: 2024-06-15 | Discharge: 2024-06-15 | Disposition: A | Discharge status: Home / Self Care | Attending: Internal Medicine | Admitting: Internal Medicine

## 2024-06-15 ENCOUNTER — Ambulatory Visit (HOSPITAL_COMMUNITY): Payer: Self-pay | Admitting: Anesthesiology

## 2024-06-15 ENCOUNTER — Encounter (HOSPITAL_COMMUNITY): Payer: Self-pay | Admitting: Anesthesiology

## 2024-06-15 ENCOUNTER — Encounter (HOSPITAL_COMMUNITY)
Admission: RE | Disposition: A | Discharge status: Home / Self Care | Payer: Self-pay | Source: Home / Self Care | Attending: Internal Medicine

## 2024-06-15 ENCOUNTER — Encounter (HOSPITAL_COMMUNITY): Payer: Self-pay | Admitting: Internal Medicine

## 2024-06-15 DIAGNOSIS — Z860101 Personal history of adenomatous and serrated colon polyps: Secondary | ICD-10-CM | POA: Diagnosis not present

## 2024-06-15 DIAGNOSIS — I10 Essential (primary) hypertension: Secondary | ICD-10-CM | POA: Diagnosis not present

## 2024-06-15 DIAGNOSIS — Z79899 Other long term (current) drug therapy: Secondary | ICD-10-CM | POA: Diagnosis not present

## 2024-06-15 DIAGNOSIS — G4730 Sleep apnea, unspecified: Secondary | ICD-10-CM | POA: Insufficient documentation

## 2024-06-15 DIAGNOSIS — K219 Gastro-esophageal reflux disease without esophagitis: Secondary | ICD-10-CM | POA: Insufficient documentation

## 2024-06-15 DIAGNOSIS — Z87891 Personal history of nicotine dependence: Secondary | ICD-10-CM | POA: Diagnosis not present

## 2024-06-15 DIAGNOSIS — K648 Other hemorrhoids: Secondary | ICD-10-CM | POA: Insufficient documentation

## 2024-06-15 DIAGNOSIS — Z1211 Encounter for screening for malignant neoplasm of colon: Secondary | ICD-10-CM | POA: Insufficient documentation

## 2024-06-15 HISTORY — PX: COLONOSCOPY: SHX5424

## 2024-06-15 LAB — HM COLONOSCOPY

## 2024-06-15 MED ORDER — PROPOFOL 10 MG/ML IV BOLUS
INTRAVENOUS | Status: DC | PRN
  Administered 2024-06-15: 150 mg via INTRAVENOUS
  Administered 2024-06-15: 100 ug/kg/min via INTRAVENOUS

## 2024-06-15 MED ORDER — LACTATED RINGERS IV SOLN: INTRAVENOUS | Status: DC

## 2024-06-15 NOTE — Op Note (Signed)
 [facility] Patient Name: Tony Patterson Procedure Date: 06/15/2024 10:07 AM MRN: [id number] Date of Birth: 18-Jan-1956 Attending MD: Carlin POUR. Cindie , [place], [id number] CSN: [id number] Age: 69 Admit Type: Outpatient Procedure:                Colonoscopy Indications:              Surveillance: Personal history of colonic polyps                            (unknown histology) on last colonoscopy more than 5                            years ago, Surveillance: Personal history of                            adenomatous polyps on last colonoscopy > 5 years ago Providers:                Carlin K. Cindie, DO, Devere Lodge, Daphne Mulch                            Technician, Technician Referring MD:              Medicines:                See the Anesthesia note for documentation of the                            administered medications Complications:            No immediate complications. Estimated Blood Loss:     Estimated blood loss: none. Procedure:                Pre-Anesthesia Assessment:                           - The anesthesia plan was to use monitored                            anesthesia care (MAC).                           After obtaining informed consent, the colonoscope                            was passed under direct vision. Throughout the                            procedure, the patient's blood pressure, pulse, and                            oxygen saturations were monitored continuously. The                            PCF-HQ190L ([id number]) Peds Colon was introduced                            through the anus and  advanced to the the cecum,                            identified by appendiceal orifice and ileocecal                            valve. The colonoscopy was performed without                            difficulty. The patient tolerated the procedure                            well. The quality of the bowel preparation was                            evaluated using  the BBPS [facility] Bowel Preparation                            Scale) with scores of: Right Colon = 2 (minor                            amount of residual staining, small fragments of                            stool and/or opaque liquid, but mucosa seen well),                            Transverse Colon = 2 (minor amount of residual                            staining, small fragments of stool and/or opaque                            liquid, but mucosa seen well) and Left Colon = 3                            (entire mucosa seen well with no residual staining,                            small fragments of stool or opaque liquid). The                            total BBPS score equals 7. The quality of the bowel                            preparation was good. Scope In: 10:26:42 AM Scope Out: 10:40:58 AM Scope Withdrawal Time: 0 hours 11 minutes 30 seconds  Total Procedure Duration: 0 hours 14 minutes 16 seconds  Findings:      Hemorrhoids were found on perianal exam.      Non-bleeding internal hemorrhoids were found.      The exam was otherwise without abnormality. Impression:               - Hemorrhoids  found on perianal exam.                           - Non-bleeding internal hemorrhoids.                           - The examination was otherwise normal.                           - No specimens collected. Moderate Sedation:      Per Anesthesia Care Recommendation:           - Patient has a contact number available for                            emergencies. The signs and symptoms of potential                            delayed complications were discussed with the                            patient. Return to normal activities tomorrow.                            Written discharge instructions were provided to the                            patient.                           - Resume previous diet.                           - Continue present medications.                           -  Repeat colonoscopy in 10 years for screening                            purposes.                           - Return to GI clinic PRN. Procedure Code(s):        --- Professional ---                           H9894, Colorectal cancer screening; colonoscopy on                            individual at high risk Diagnosis Code(s):        --- Professional ---                           K64.8, Other hemorrhoids                           Z86.010, Personal history of colonic polyps CPT copyright 2022 American Medical Association. All rights reserved. The  codes documented in this report are preliminary and upon coder review may  be revised to meet current compliance requirements. Carlin POUR. Cindie, DO Carlin POUR. Cindie, DO 06/15/2024 10:46:02 AM This report has been signed electronically. Number of Addenda: 0

## 2024-06-15 NOTE — Transfer of Care (Signed)
 Immediate Anesthesia Transfer of Care Note  Patient: Tony Patterson  Procedure(s) Performed: COLONOSCOPY  Patient Location: Short Stay  Anesthesia Type:MAC  Level of Consciousness: awake and patient cooperative  Airway & Oxygen Therapy: Patient Spontanous Breathing  Post-op Assessment: Report given to RN and Post -op Vital signs reviewed and stable  Post vital signs: Reviewed and stable  Last Vitals:  Vitals Value Taken Time  BP 109/58 06/15/24 10:45  Temp 36.5 C 06/15/24 10:45  Pulse 66 06/15/24 10:45  Resp 18 06/15/24 10:45  SpO2 100 % 06/15/24 10:45    Last Pain:  Vitals:   06/15/24 1045  TempSrc: Oral  PainSc: 0-No pain         Complications: No notable events documented.

## 2024-06-15 NOTE — Interval H&P Note (Signed)
 History and Physical Interval Note:  06/15/2024 9:35 AM  Tony Patterson  has presented today for surgery, with the diagnosis of history of colon polyps, diarrhea.  The various methods of treatment have been discussed with the patient and family. After consideration of risks, benefits and other options for treatment, the patient has consented to  Procedures with comments: COLONOSCOPY (N/A) - 9:30am, ASA 3 as a surgical intervention.  The patient's history has been reviewed, patient examined, no change in status, stable for surgery.  I have reviewed the patient's chart and labs.  Questions were answered to the patient's satisfaction.     Tony Patterson

## 2024-06-15 NOTE — Anesthesia Procedure Notes (Signed)
 Date/Time: 06/15/2024 10:21 AM  Performed by: Barbarann Verneita RAMAN, CRNAPre-anesthesia Checklist: Patient identified, Emergency Drugs available, Suction available, Timeout performed and Patient being monitored Patient Re-evaluated:Patient Re-evaluated prior to induction Oxygen Delivery Method: Nasal cannula Comments: Optiflow

## 2024-06-15 NOTE — Anesthesia Preprocedure Evaluation (Signed)
"                                    Anesthesia Evaluation  Patient identified by MRN, date of birth, ID band Patient awake    Reviewed: Allergy & Precautions, H&P , NPO status , Patient's Chart, lab work & pertinent test results, reviewed documented beta blocker date and time   Airway Mallampati: II  TM Distance: >3 FB Neck ROM: full    Dental no notable dental hx.    Pulmonary sleep apnea , former smoker   Pulmonary exam normal breath sounds clear to auscultation       Cardiovascular Exercise Tolerance: Good hypertension,  Rhythm:regular Rate:Normal     Neuro/Psych Seizures -,  PSYCHIATRIC DISORDERS Anxiety Depression     Neuromuscular disease    GI/Hepatic negative GI ROS,,,(+)     substance abuse  alcohol use  Endo/Other  negative endocrine ROS    Renal/GU Renal disease  negative genitourinary   Musculoskeletal   Abdominal   Peds  Hematology  (+) Blood dyscrasia, anemia   Anesthesia Other Findings   Reproductive/Obstetrics negative OB ROS                              Anesthesia Physical Anesthesia Plan  ASA: 3  Anesthesia Plan: MAC   Post-op Pain Management:    Induction:   PONV Risk Score and Plan: Propofol  infusion  Airway Management Planned:   Additional Equipment:   Intra-op Plan:   Post-operative Plan:   Informed Consent: I have reviewed the patients History and Physical, chart, labs and discussed the procedure including the risks, benefits and alternatives for the proposed anesthesia with the patient or authorized representative who has indicated his/her understanding and acceptance.     Dental Advisory Given  Plan Discussed with: CRNA  Anesthesia Plan Comments:         Anesthesia Quick Evaluation  "

## 2024-06-15 NOTE — Discharge Instructions (Addendum)

## 2024-06-16 ENCOUNTER — Encounter (HOSPITAL_COMMUNITY): Payer: Self-pay | Admitting: Internal Medicine

## 2024-06-16 ENCOUNTER — Encounter (INDEPENDENT_AMBULATORY_CARE_PROVIDER_SITE_OTHER): Payer: Self-pay | Admitting: *Deleted

## 2024-06-17 NOTE — Anesthesia Postprocedure Evaluation (Signed)
"   Anesthesia Post Note  Patient: Tony Patterson  Procedure(s) Performed: COLONOSCOPY  Patient location during evaluation: Phase II Anesthesia Type: MAC Level of consciousness: awake Pain management: pain level controlled Vital Signs Assessment: post-procedure vital signs reviewed and stable Respiratory status: spontaneous breathing and respiratory function stable Cardiovascular status: blood pressure returned to baseline and stable Postop Assessment: no headache and no apparent nausea or vomiting Anesthetic complications: no Comments: Late entry   No notable events documented.   Last Vitals:  Vitals:   06/15/24 0920 06/15/24 1045  BP: (!) 169/91 (!) 109/58  Pulse:  66  Resp: 18 18  Temp: 36.6 C 36.5 C  SpO2: 100% 100%    Last Pain:  Vitals:   06/16/24 1449  TempSrc:   PainSc: 0-No pain                 Yvonna PARAS Yanel Dombrosky      "

## 2025-03-23 ENCOUNTER — Other Ambulatory Visit

## 2025-03-30 ENCOUNTER — Ambulatory Visit: Admitting: Urology
